# Patient Record
Sex: Female | Born: 1940 | Race: White | Hispanic: No | Marital: Single | Smoking: Never smoker
Health system: Southern US, Community
[De-identification: ages and names within clinical notes are randomized; demographics above are authoritative.]

## PROBLEM LIST (undated history)

## (undated) DIAGNOSIS — E785 Hyperlipidemia, unspecified: Secondary | ICD-10-CM

## (undated) DIAGNOSIS — E559 Vitamin D deficiency, unspecified: Secondary | ICD-10-CM

## (undated) DIAGNOSIS — E079 Disorder of thyroid, unspecified: Secondary | ICD-10-CM

## (undated) DIAGNOSIS — I639 Cerebral infarction, unspecified: Secondary | ICD-10-CM

## (undated) DIAGNOSIS — N189 Chronic kidney disease, unspecified: Secondary | ICD-10-CM

## (undated) DIAGNOSIS — C801 Malignant (primary) neoplasm, unspecified: Secondary | ICD-10-CM

## (undated) DIAGNOSIS — I10 Essential (primary) hypertension: Secondary | ICD-10-CM

## (undated) DIAGNOSIS — E119 Type 2 diabetes mellitus without complications: Secondary | ICD-10-CM

## (undated) HISTORY — PX: ABDOMINAL HYSTERECTOMY: SHX81

## (undated) HISTORY — DX: Vitamin D deficiency, unspecified: E55.9

## (undated) HISTORY — DX: Cerebral infarction, unspecified: I63.9

## (undated) HISTORY — DX: Essential (primary) hypertension: I10

## (undated) HISTORY — DX: Malignant (primary) neoplasm, unspecified: C80.1

## (undated) HISTORY — DX: Hyperlipidemia, unspecified: E78.5

## (undated) HISTORY — PX: KNEE SURGERY: SHX244

## (undated) HISTORY — PX: TONSILLECTOMY: SUR1361

## (undated) HISTORY — DX: Type 2 diabetes mellitus without complications: E11.9

## (undated) HISTORY — DX: Disorder of thyroid, unspecified: E07.9

## (undated) HISTORY — PX: NEPHRECTOMY: SHX65

## (undated) HISTORY — DX: Chronic kidney disease, unspecified: N18.9

---

## 2008-03-03 DIAGNOSIS — I10 Essential (primary) hypertension: Secondary | ICD-10-CM | POA: Insufficient documentation

## 2008-03-03 DIAGNOSIS — E039 Hypothyroidism, unspecified: Secondary | ICD-10-CM | POA: Insufficient documentation

## 2016-01-03 DIAGNOSIS — M2040 Other hammer toe(s) (acquired), unspecified foot: Secondary | ICD-10-CM | POA: Insufficient documentation

## 2016-12-29 DIAGNOSIS — B351 Tinea unguium: Secondary | ICD-10-CM | POA: Insufficient documentation

## 2017-07-02 DIAGNOSIS — E1140 Type 2 diabetes mellitus with diabetic neuropathy, unspecified: Secondary | ICD-10-CM | POA: Insufficient documentation

## 2017-07-02 DIAGNOSIS — R200 Anesthesia of skin: Secondary | ICD-10-CM | POA: Insufficient documentation

## 2019-09-21 ENCOUNTER — Ambulatory Visit: Payer: Self-pay

## 2019-09-27 ENCOUNTER — Ambulatory Visit: Payer: Self-pay

## 2019-10-12 ENCOUNTER — Ambulatory Visit: Payer: Self-pay

## 2020-07-26 DIAGNOSIS — N281 Cyst of kidney, acquired: Secondary | ICD-10-CM | POA: Insufficient documentation

## 2020-09-14 DIAGNOSIS — W19XXXA Unspecified fall, initial encounter: Secondary | ICD-10-CM | POA: Insufficient documentation

## 2020-10-18 DIAGNOSIS — L89620 Pressure ulcer of left heel, unstageable: Secondary | ICD-10-CM | POA: Insufficient documentation

## 2020-10-18 DIAGNOSIS — M6702 Short Achilles tendon (acquired), left ankle: Secondary | ICD-10-CM | POA: Insufficient documentation

## 2020-10-18 DIAGNOSIS — I872 Venous insufficiency (chronic) (peripheral): Secondary | ICD-10-CM | POA: Insufficient documentation

## 2020-10-26 ENCOUNTER — Encounter (HOSPITAL_BASED_OUTPATIENT_CLINIC_OR_DEPARTMENT_OTHER): Payer: Medicare PPO | Attending: Internal Medicine | Admitting: Physician Assistant

## 2020-10-26 ENCOUNTER — Other Ambulatory Visit: Payer: Self-pay

## 2020-10-26 DIAGNOSIS — I872 Venous insufficiency (chronic) (peripheral): Secondary | ICD-10-CM | POA: Diagnosis not present

## 2020-10-26 DIAGNOSIS — E11621 Type 2 diabetes mellitus with foot ulcer: Secondary | ICD-10-CM | POA: Insufficient documentation

## 2020-10-26 DIAGNOSIS — L89620 Pressure ulcer of left heel, unstageable: Secondary | ICD-10-CM | POA: Insufficient documentation

## 2020-10-26 DIAGNOSIS — L97822 Non-pressure chronic ulcer of other part of left lower leg with fat layer exposed: Secondary | ICD-10-CM | POA: Diagnosis not present

## 2020-10-26 DIAGNOSIS — Z8673 Personal history of transient ischemic attack (TIA), and cerebral infarction without residual deficits: Secondary | ICD-10-CM | POA: Insufficient documentation

## 2020-10-26 NOTE — Progress Notes (Signed)
CLOVIA, REINE ([id number]) Visit Report for 10/26/2020 Abuse/Suicide Risk Screen Details Patient Name: Date of Service: Dorothy, Barker 10/26/2020 2:45 PM Medical Record Number: [id number] Patient Account Number: [id number] Date of Birth/Sex: Treating RN: 07-15-1941 (80 y.o. Dorothy Barker Primary Care Breanne Olvera: PA Haig Prophet, NO Other Clinician: Referring Kennth Vanbenschoten: Treating Sherrin Stahle/Extender: Sharalyn Ink in Treatment: 0 Abuse/Suicide Risk Screen Items Answer ABUSE RISK SCREEN: Has anyone close to you tried to hurt or harm you recentlyo No Do you feel uncomfortable with anyone in your familyo No Has anyone forced you do things that you didnt want to doo No Electronic Signature(s) Signed: 10/26/2020 6:01:57 PM By: Baruch Gouty RN, BSN Entered By: Baruch Gouty on 10/26/2020 15:58:49 -------------------------------------------------------------------------------- Activities of Daily Living Details Patient Name: Date of Service: Dorothy, Barker 10/26/2020 2:45 PM Medical Record Number: [id number] Patient Account Number: [id number] Date of Birth/Sex: Treating RN: 07/28/1941 (80 y.o. Dorothy Barker Primary Care Cameren Earnest: PA Haig Prophet, NO Other Clinician: Referring Xiong Haidar: Treating Vaidehi Braddy/Extender: Sharalyn Ink in Treatment: 0 Activities of Daily Living Items Answer Activities of Daily Living (Please select one for each item) Drive Automobile Not Able T Medications ake Need Assistance Use T elephone Completely Able Care for Appearance Need Assistance Use T oilet Need Assistance Bath / Shower Need Assistance Dress Self Need Assistance Feed Self Completely Able Walk Need Assistance Get In / Out Bed Need Assistance Housework Not Able Prepare Meals Not Able Handle Money Need Assistance Shop for Self Need Assistance Electronic Signature(s) Signed: 10/26/2020 6:01:57 PM By: Baruch Gouty RN, BSN Entered By: Baruch Gouty on 10/26/2020  15:59:53 -------------------------------------------------------------------------------- Education Screening Details Patient Name: Date of Service: Dorothy Barker 10/26/2020 2:45 PM Medical Record Number: [id number] Patient Account Number: [id number] Date of Birth/Sex: Treating RN: April 10, 1941 (79 y.o. Dorothy Barker Primary Care Shaquitta Burbridge: PA Haig Prophet, NO Other Clinician: Referring Eames Dibiasio: Treating Leidi Astle/Extender: Sharalyn Ink in Treatment: 0 Primary Learner Assessed: Patient Learning Preferences/Education Level/Primary Language Learning Preference: Explanation, Demonstration, Printed Material Highest Education Level: College or Above Preferred Language: English Cognitive Barrier Language Barrier: No Translator Needed: No Memory Deficit: Yes Emotional Barrier: No Cultural/Religious Beliefs Affecting Medical Care: No Physical Barrier Impaired Vision: Yes Glasses Impaired Hearing: No Decreased Hand dexterity: No Knowledge/Comprehension Knowledge Level: High Comprehension Level: High Ability to understand written instructions: High Ability to understand verbal instructions: High Motivation Anxiety Level: Calm Cooperation: Cooperative Education Importance: Acknowledges Need Interest in Health Problems: Asks Questions Perception: Coherent Willingness to Engage in Self-Management High Activities: Readiness to Engage in Self-Management High Activities: Electronic Signature(s) Signed: 10/26/2020 6:01:57 PM By: Baruch Gouty RN, BSN Entered By: Baruch Gouty on 10/26/2020 16:00:29 -------------------------------------------------------------------------------- Fall Risk Assessment Details Patient Name: Date of Service: Dorothy Barker 10/26/2020 2:45 PM Medical Record Number: [id number] Patient Account Number: [id number] Date of Birth/Sex: Treating RN: August 26, 1940 (79 y.o. Dorothy Barker Primary Care Aldo Sondgeroth: PA Haig Prophet, NO Other Clinician: Referring  Dacie Mandel: Treating Sabrina Arriaga/Extender: Sharalyn Ink in Treatment: 0 Fall Risk Assessment Items Have you had 2 or more falls in the last 12 monthso 0 Yes Have you had any fall that resulted in injury in the last 12 monthso 0 Yes FALLS RISK SCREEN History of falling - immediate or within 3 months 25 Yes Secondary diagnosis (Do you have 2 or more medical diagnoseso) 0 No Ambulatory aid None/bed rest/wheelchair/nurse 0 No Crutches/cane/walker 15 Yes Furniture 0 No Intravenous therapy Access/Saline/Heparin Lock 0 No Gait/Transferring Normal/ bed rest/ wheelchair 0 No Weak (short  steps with or without shuffle, stooped but able to lift head while walking, may seek 10 Yes support from furniture) Impaired (short steps with shuffle, may have difficulty arising from chair, head down, impaired 0 No balance) Mental Status Oriented to own ability 0 Yes Electronic Signature(s) Signed: 10/26/2020 6:01:57 PM By: Baruch Gouty RN, BSN Entered By: Baruch Gouty on 10/26/2020 16:01:00 -------------------------------------------------------------------------------- Foot Assessment Details Patient Name: Date of Service: Dorothy Barker 10/26/2020 2:45 PM Medical Record Number: [id number] Patient Account Number: [id number] Date of Birth/Sex: Treating RN: [birth year] (79 y.o. Dorothy Barker Primary Care Nichael Ehly: PA TIENT, NO Other Clinician: Referring Jonda Alanis: Treating Zayanna Pundt/Extender: Sharalyn Ink in Treatment: 0 Foot Assessment Items Site Locations + = Sensation present, - = Sensation absent, C = Callus, U = Ulcer R = Redness, W = Warmth, M = Maceration, PU = Pre-ulcerative lesion F = Fissure, S = Swelling, D = Dryness Assessment Right: Left: Other Deformity: No No Prior Foot Ulcer: Yes No Prior Amputation: No No Charcot Joint: No No Ambulatory Status: Ambulatory With Help Assistance Device: Walker Gait: Steady Electronic Signature(s) Signed: 10/26/2020 6:01:57  PM By: Baruch Gouty RN, BSN Entered By: Baruch Gouty on 10/26/2020 16:03:56 -------------------------------------------------------------------------------- Nutrition Risk Screening Details Patient Name: Date of Service: Dorothy Barker 10/26/2020 2:45 PM Medical Record Number: [id number] Patient Account Number: [id number] Date of Birth/Sex: Treating RN: 08-02-1941 (79 y.o. Dorothy Barker Primary Care Foday Cone: PA Haig Prophet, NO Other Clinician: Referring Rutha Melgoza: Treating Quron Ruddy/Extender: Sharalyn Ink in Treatment: 0 Height (in): 62 Weight (lbs): 173 Body Mass Index (BMI): 31.6 Nutrition Risk Screening Items Score Screening NUTRITION RISK SCREEN: I have an illness or condition that made me change the kind and/or amount of food I eat 0 No I eat fewer than two meals per day 0 No I eat few fruits and vegetables, or milk products 0 No I have three or more drinks of beer, liquor or wine almost every day 0 No I have tooth or mouth problems that make it hard for me to eat 0 No I don't always have enough money to buy the food I need 0 No I eat alone most of the time 0 No I take three or more different prescribed or over-the-counter drugs a day 1 Yes Without wanting to, I have lost or gained 10 pounds in the last six months 0 No I am not always physically able to shop, cook and/or feed myself 0 No Nutrition Protocols Good Risk Protocol 0 No interventions needed Moderate Risk Protocol High Risk Proctocol Risk Level: Good Risk Score: 1 Electronic Signature(s) Signed: 10/26/2020 6:01:57 PM By: Baruch Gouty RN, BSN Entered By: Baruch Gouty on 10/26/2020 16:01:53

## 2020-10-27 NOTE — Progress Notes (Signed)
NYIA, TSAO ([id number]) Visit Report for 10/26/2020 Chief Complaint Document Details Patient Name: Date of Service: Dorothy Barker, Dorothy Barker 10/26/2020 2:45 PM Medical Record Number: [id number] Patient Account Number: [id number] Date of Birth/Sex: Treating RN: Apr 26, 1941 (80 y.o. Female) Rhae Hammock Primary Care Provider: PA Haig Prophet, NO Other Clinician: Referring Provider: Treating Provider/Extender: Sharalyn Ink in Treatment: 0 Information Obtained from: Patient Chief Complaint Left heel and leg leg ulcer Electronic Signature(s) Signed: 10/26/2020 4:30:44 PM By: Worthy Keeler PA-C Entered By: Worthy Keeler on 10/26/2020 16:30:44 -------------------------------------------------------------------------------- HPI Details Patient Name: Date of Service: Dorothy Barker 10/26/2020 2:45 PM Medical Record Number: [id number] Patient Account Number: [id number] Date of Birth/Sex: Treating RN: 1940/08/30 (80 y.o. Female) Rhae Hammock Primary Care Provider: PA Haig Prophet, NO Other Clinician: Referring Provider: Treating Provider/Extender: Sharalyn Ink in Treatment: 0 History of Present Illness HPI Description: 10/26/2020 upon evaluation today patient appears to be doing somewhat poorly in regard to wounds on her left heel and left lower leg. The leg is really not nearly as bad as the heel ulcer however. This is an unstageable pressure ulcer unfortunately that occurred when the patient was in the hospital secondary to a stroke. At this point the patient is seen with her daughter as well and both are able to answer and respond to questions. With that being said that the biggest issue right now is that the eschar on the heel is not really stable I think that this is going to need to be addressed with more specific and appropriate dressings currently. Apparently she did see a podiatrist more recently and they did perform some sharp debridement to clear away necrotic tissue here on  the heel. T be honest the patient's daughter is not extremely pleased about that she states that they heal was completely dry and stable at that point. With that o being said obviously I did not see anything at that time and I cannot really speak to what the heel look like and then but right now it definitely appears to be much softer and I do not think just Betadine is good to do the job. I think we have to focus on trying to get the eschar off of the heel at this point. The patient is in a assisted living facility currently. With that being said the daughter also questions whether or not she is going require skilled nursing. She does have a history of diabetes mellitus type 2. She also has a history of chronic venous insufficiency. Electronic Signature(s) Signed: 10/27/2020 8:24:45 AM By: Worthy Keeler PA-C Entered By: Worthy Keeler on 10/27/2020 08:24:44 -------------------------------------------------------------------------------- Physical Exam Details Patient Name: Date of Service: Dorothy Barker, Dorothy Barker 10/26/2020 2:45 PM Medical Record Number: [id number] Patient Account Number: [id number] Date of Birth/Sex: Treating RN: 1941-08-10 (80 y.o. Female) Rhae Hammock Primary Care Provider: PA Haig Prophet, NO Other Clinician: Referring Provider: Treating Provider/Extender: Sharalyn Ink in Treatment: 0 Constitutional patient is hypertensive.. pulse regular and within target range for patient.Marland Kitchen respirations regular, non-labored and within target range for patient.Marland Kitchen temperature within target range for patient.. Well-nourished and well-hydrated in no acute distress. Eyes conjunctiva clear no eyelid edema noted. pupils equal round and reactive to light and accommodation. Ears, Nose, Mouth, and Throat no gross abnormality of ear auricles or external auditory canals. normal hearing noted during conversation. mucus membranes moist. Respiratory normal breathing without  difficulty. Cardiovascular 2+ dorsalis pedis/posterior tibialis pulses. no clubbing, cyanosis, significant edema, <3 sec cap refill.  Musculoskeletal Patient unable to walk without assistance. no significant deformity or arthritic changes, no loss or range of motion, no clubbing. Psychiatric this patient is able to make decisions and demonstrates good insight into disease process. Alert and Oriented x 3. pleasant and cooperative. Notes Upon inspection patient does show evidence of a fairly superficial appearing wound on the lateral/posterior leg. There does not appear to be anything that I think is good to be a major issue here to be perfectly honest. With that being said I am more concerned at this point with regard to the heel which is eschar covered and does not appear to be stable. It actually appears that this is starting to soften up I do not see any signs of infection but I do believe we need to get the eschar clear away. Electronic Signature(s) Signed: 10/27/2020 8:25:37 AM By: Worthy Keeler PA-C Entered By: Worthy Keeler on 10/27/2020 08:25:37 -------------------------------------------------------------------------------- Physician Orders Details Patient Name: Date of Service: Dorothy Barker, Dorothy Barker 10/26/2020 2:45 PM Medical Record Number: [id number] Patient Account Number: [id number] Date of Birth/Sex: Treating RN: [birth year] (80 y.o. Female) Rhae Hammock Primary Care Provider: PA Haig Prophet, NO Other Clinician: Referring Provider: Treating Provider/Extender: Sharalyn Ink in Treatment: 0 Verbal / Phone Orders: No Diagnosis Coding ICD-10 Coding Code Description I87.2 Venous insufficiency (chronic) (peripheral) L97.822 Non-pressure chronic ulcer of other part of left lower leg with fat layer exposed E11.621 Type 2 diabetes mellitus with foot ulcer L89.620 Pressure ulcer of left heel, unstageable Follow-up Appointments Return Appointment in 1 week. Bathing/ Shower/  Hygiene May shower with protection but do not get wound dressing(s) wet. Edema Control - Lymphedema / SCD / Other Elevate legs to the level of the heart or above for 30 minutes daily and/or when sitting, a frequency of: Avoid standing for long periods of time. Moisturize legs daily. Off-Loading Other: - offloading shoe for left heel Home Health New wound care orders this week; continue Home Health for wound care. May utilize formulary equivalent dressing for wound treatment orders unless otherwise specified. Alvis Lemmings Wound Treatment Wound #1 - Calcaneus Wound Laterality: Left Cleanser: Wound Cleanser [facility]) Every Other Day/15 Days Discharge Instructions: Cleanse the wound with wound cleanser prior to applying a clean dressing using gauze sponges, not tissue or cotton balls. Prim Dressing: Iodosorb Gel 10 (gm) Tube [facility]) Every Other Day/15 Days ary Discharge Instructions: Apply to wound bed as instructed Secondary Dressing: Woven Gauze Sponge, Non-Sterile 4x4 in [facility]) Every Other Day/15 Days Discharge Instructions: Apply over primary dressing as directed. Secondary Dressing: ABD Pad, 5x9 [facility]) Every Other Day/15 Days Discharge Instructions: Apply over primary dressing as directed. Secured With: [employer], 4.5x3.1 (in/yd) [facility]) Every Other Day/15 Days Discharge Instructions: Secure with Kerlix as directed. Secured With: 55M Medipore H Soft Cloth Surgical T 4 x 2 (in/yd) (Home Health) Every Other Day/15 Days ape Discharge Instructions: Secure dressing with tape as directed. Wound #2 - Lower Leg Wound Laterality: Left, Medial Cleanser: Wound Cleanser (Home Health) Every Other Day/15 Days Discharge Instructions: Cleanse the wound with wound cleanser prior to applying a clean dressing using gauze sponges, not tissue or cotton balls. Prim Dressing: Iodosorb Gel 10 (gm) Tube [facility]) Every Other Day/15 Days ary Discharge Instructions: Apply  to wound bed as instructed Secondary Dressing: Woven Gauze Sponge, Non-Sterile 4x4 in [facility]) Every Other Day/15 Days Discharge Instructions: Apply over primary dressing as directed. Secondary Dressing: ABD Pad, 5x9 [facility]) Every Other  Day/15 Days Discharge Instructions: Apply over primary dressing as directed. Secured With: [employer], 4.5x3.1 (in/yd) [facility]) Every Other Day/15 Days Discharge Instructions: Secure with Kerlix as directed. Secured With: 86M Medipore H Soft Cloth Surgical T 4 x 2 (in/yd) (Home Health) Every Other Day/15 Days ape Discharge Instructions: Secure dressing with tape as directed. Services and Therapies Ankle Brachial Index (ABI) Electronic Signature(s) Signed: 10/27/2020 8:28:17 AM By: Worthy Keeler PA-C Signed: 10/27/2020 5:36:09 PM By: Rhae Hammock RN Entered By: Rhae Hammock on 10/26/2020 16:55:04 -------------------------------------------------------------------------------- Problem List Details Patient Name: Date of Service: Dorothy Barker 10/26/2020 2:45 PM Medical Record Number: [id number] Patient Account Number: [id number] Date of Birth/Sex: Treating RN: April 13, 1941 (79 y.o. 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o. Female) Rhae Hammock Primary Care Provider: PA Haig Prophet, NO Other Clinician: Referring Provider: Treating Provider/Extender: Sharalyn Ink in Treatment: 0 Subjective Chief Complaint Information obtained from Patient Left heel and leg leg ulcer History of Present Illness (HPI) 10/26/2020 upon evaluation today patient appears to be doing somewhat poorly in regard to wounds on her left heel and left lower leg. The leg is really not nearly as bad as the heel ulcer however. This is an unstageable pressure ulcer unfortunately that occurred when the patient was in the hospital secondary to a stroke. At this point the patient is seen with her daughter as well and both are able to answer and respond to questions. With that being said that the biggest issue right now is that the eschar on the heel is not really stable I think that this is going to need to be addressed with more specific and appropriate dressings currently. Apparently she did see a podiatrist more recently and they did perform some sharp debridement to clear away necrotic tissue here on the heel. T be honest o the patient's daughter is not extremely pleased about that she states that they heal was completely dry and stable at that point. With that being said obviously I did not see anything at that time and I cannot really speak to what the heel look like and then but right now it definitely appears to be much softer and I do not think just Betadine is good to do the job. I think we have to focus on trying to get the eschar off of the heel at this point. The patient is in a assisted living facility currently. With that being said the  daughter also questions whether or not she is going require skilled nursing. She does have a history of diabetes mellitus type 2. She also has a history of chronic venous insufficiency. Patient History Information obtained from Patient. Allergies codeine (Reaction: unknown), Lotrel (Severity: Severe, Reaction: syncope, weakness), tramadol (Reaction: unknown) Family History Cancer -  Mother,Maternal Grandparents, Diabetes - Child, Hypertension - Father, Stroke - Mother,Father, No family history of Heart Disease, Hereditary Spherocytosis, Kidney Disease, Lung Disease, Seizures, Thyroid Problems, Tuberculosis. Social History Never smoker, Marital Status - Divorced, Alcohol Use - Never, Drug Use - No History, Caffeine Use - Daily. Medical History Eyes Patient has history of Cataracts Denies history of Glaucoma, Optic Neuritis Cardiovascular Patient has history of Hypertension Endocrine Patient has history of Type II Diabetes Denies history of Type I Diabetes Genitourinary Denies history of End Stage Renal Disease Integumentary (Skin) Denies history of History of Burn Musculoskeletal Patient has history of Osteoarthritis Denies history of Gout, Rheumatoid Arthritis, Osteomyelitis Neurologic Patient has history of Dementia Denies history of Neuropathy, Quadriplegia, Paraplegia, Seizure Disorder Oncologic Denies history of Received Chemotherapy, Received Radiation Psychiatric Denies history of Anorexia/bulimia, Confinement Anxiety Patient is treated with Insulin. Blood sugar is tested. Hospitalization/Surgery History - left nephrectomy. - hysterectomy. - right knee replacement. - bil shoulder rotator cuff repair. - tonsillectomy. - appendectomy. - cholecystectomy. Medical A Surgical History Notes nd Cardiovascular hyperlipidemia Endocrine hypothyroidism Genitourinary hx renal cancer, CKD Neurologic hx CVA Oncologic renal cancer Review of Systems (ROS) Constitutional Symptoms  (General Health) Denies complaints or symptoms of Fatigue, Fever, Chills, Marked Weight Change. Eyes Complains or has symptoms of Glasses / Contacts. Ear/Nose/Mouth/Throat Denies complaints or symptoms of Chronic sinus problems or rhinitis. Respiratory Denies complaints or symptoms of Chronic or frequent coughs, Shortness of Breath. Cardiovascular Denies complaints or symptoms of Chest pain. Gastrointestinal Denies complaints or symptoms of Frequent diarrhea, Nausea, Vomiting. Endocrine Denies complaints or symptoms of Heat/cold intolerance. Genitourinary Denies complaints or symptoms of Frequent urination. Integumentary (Skin) Complains or has symptoms of Wounds - left heel. Musculoskeletal Complains or has symptoms of Muscle Weakness. Denies complaints or symptoms of Muscle Pain. Neurologic Denies complaints or symptoms of Numbness/parasthesias. Psychiatric Denies complaints or symptoms of Claustrophobia, Suicidal. Objective Constitutional patient is hypertensive.. pulse regular and within target range for patient.Marland Kitchen respirations regular, non-labored and within target range for patient.Marland Kitchen temperature within target range for patient.. Well-nourished and well-hydrated in no acute distress. Vitals Time Taken: 3:40 PM, Height: 62 in, Source: Stated, Weight: 173 lbs, Source: Stated, BMI: 31.6, Temperature: 98.4 F, Pulse: 76 bpm, Respiratory Rate: 18 breaths/min, Blood Pressure: 168/98 mmHg, Capillary Blood Glucose: 98 mg/dl. Eyes conjunctiva clear no eyelid edema noted. pupils equal round and reactive to light and accommodation. Ears, Nose, Mouth, and Throat no gross abnormality of ear auricles or external auditory canals. normal hearing noted during conversation. mucus membranes moist. Respiratory normal breathing without difficulty. Cardiovascular 2+ dorsalis pedis/posterior tibialis pulses. no clubbing, cyanosis, significant edema, Musculoskeletal Patient unable to walk  without assistance. no significant deformity or arthritic changes, no loss or range of motion, no clubbing. Psychiatric this patient is able to make decisions and demonstrates good insight into disease process. Alert and Oriented x 3. pleasant and cooperative. General Notes: Upon inspection patient does show evidence of a fairly superficial appearing wound on the lateral/posterior leg. There does not appear to be anything that I think is good to be a major issue here to be perfectly honest. With that being said I am more concerned at this point with regard to the heel which is eschar covered and does not appear to be stable. It actually appears that this is starting to soften up I do not see any signs of infection but I do believe we need to get the eschar clear away. Integumentary (Hair, Skin) Wound #1 status is Open. Original cause of  wound was Pressure Injury. The date acquired was: 09/21/2020. The wound is located on the Left Calcaneus. The wound measures 1.8cm length x 2.8cm width x 0.1cm depth; 3.958cm^2 area and 0.396cm^3 volume. There is Fat Layer (Subcutaneous Tissue) exposed. There is no tunneling or undermining noted. There is a small amount of serosanguineous drainage noted. The wound margin is flat and intact. There is small (1- 33%) pink granulation within the wound bed. There is a large (67-100%) amount of necrotic tissue within the wound bed including Eschar. Wound #2 status is Open. Original cause of wound was Pressure Injury. The date acquired was: 10/19/2020. The wound is located on the Left,Medial Lower Leg. The wound measures 0.3cm length x 1cm width x 0.1cm depth; 0.236cm^2 area and 0.024cm^3 volume. There is Fat Layer (Subcutaneous Tissue) exposed. There is no tunneling or undermining noted. There is a none present amount of drainage noted. The wound margin is flat and intact. There is no granulation within the wound bed. There is a large (67-100%) amount of necrotic tissue within  the wound bed including Eschar. Assessment Active Problems ICD-10 Venous insufficiency (chronic) (peripheral) Non-pressure chronic ulcer of other part of left lower leg with fat layer exposed Type 2 diabetes mellitus with foot ulcer Pressure ulcer of left heel, unstageable Plan Follow-up Appointments: Return Appointment in 1 week. Bathing/ Shower/ Hygiene: May shower with protection but do not get wound dressing(s) wet. Edema Control - Lymphedema / SCD / Other: Elevate legs to the level of the heart or above for 30 minutes daily and/or when sitting, a frequency of: Avoid standing for long periods of time. Moisturize legs daily. Off-Loading: Other: - offloading shoe for left heel Home Health: New wound care orders this week; continue Home Health for wound care. May utilize formulary equivalent dressing for wound treatment orders unless otherwise specified. Alvis Lemmings Services and Therapies ordered were: Ankle Brachial Index (ABI) WOUND #1: - Calcaneus Wound Laterality: Left Cleanser: Wound Cleanser (Home Health) Every Other Day/15 Days Discharge Instructions: Cleanse the wound with wound cleanser prior to applying a clean dressing using gauze sponges, not tissue or cotton balls. Prim Dressing: Iodosorb Gel 10 (gm) Tube [facility]) Every Other Day/15 Days ary Discharge Instructions: Apply to wound bed as instructed Secondary Dressing: Woven Gauze Sponge, Non-Sterile 4x4 in [facility]) Every Other Day/15 Days Discharge Instructions: Apply over primary dressing as directed. Secondary Dressing: ABD Pad, 5x9 [facility]) Every Other Day/15 Days Discharge Instructions: Apply over primary dressing as directed. Secured With: [employer], 4.5x3.1 (in/yd) [facility]) Every Other Day/15 Days Discharge Instructions: Secure with Kerlix as directed. Secured With: 80M Medipore H Soft Cloth Surgical T 4 x 2 (in/yd) (Home Health) Every Other Day/15 Days ape Discharge Instructions:  Secure dressing with tape as directed. WOUND #2: - Lower Leg Wound Laterality: Left, Medial Cleanser: Wound Cleanser (Home Health) Every Other Day/15 Days Discharge Instructions: Cleanse the wound with wound cleanser prior to applying a clean dressing using gauze sponges, not tissue or cotton balls. Prim Dressing: Iodosorb Gel 10 (gm) Tube [facility]) Every Other Day/15 Days ary Discharge Instructions: Apply to wound bed as instructed Secondary Dressing: Woven Gauze Sponge, Non-Sterile 4x4 in [facility]) Every Other Day/15 Days Discharge Instructions: Apply over primary dressing as directed. Secondary Dressing: ABD Pad, 5x9 [facility]) Every Other Day/15 Days Discharge Instructions: Apply over primary dressing as directed. Secured With: [employer], 4.5x3.1 (in/yd) [facility]) Every Other Day/15 Days Discharge Instructions: Secure with Kerlix as directed. Secured With:  58M Medipore H Soft Cloth Surgical T 4 x 2 (in/yd) (Home Health) Every Other Day/15 Days ape Discharge Instructions: Secure dressing with tape as directed. 1. I would recommend currently that we actually go ahead and initiate treatment with Iodosorb this is kind of a secondary option primarily I would have probably going forward with Santyl but the patient really does not have a way to have daily dressing changes at this point. If she was in skilled nursing that would be a different story. Nonetheless I do believe the Iodosorb is a great option here and hopefully will start to loosen up a lot of the eschar in that regard. 2. I am also can recommend at this time that the patient does need to go for an arterial study with ABI and TBI. I feel like that this is good to be important as far as trying to get things under control here we need to know that she has good blood flow before we can perform any type of aggressive debridement. Especially in light of the fact that she was noncompressible here in the office  today. 3. I am also can recommend that we have the patient keep pressure off of the heel we can give her a heel offloading shoe today. Subsequently she should also continue to monitor for and keep pressure off of the heel even when she is sitting and again that may require a little bit of adjustment as far as her daughter helping her figure out the best ways to do this. We will see patient back for reevaluation in 1 week here in the clinic. If anything worsens or changes patient will contact our office for additional recommendations. Electronic Signature(s) Signed: 10/27/2020 8:27:04 AM By: Worthy Keeler PA-C Entered By: Worthy Keeler on 10/27/2020 08:27:04 -------------------------------------------------------------------------------- HxROS Details Patient Name: Date of Service: Dorothy Barker 10/26/2020 2:45 PM Medical Record Number: [id number] Patient Account Number: [id number] Date of Birth/Sex: Treating RN: March 16, 1941 (80 y.o. Female) Baruch Gouty Primary Care Provider: PA Haig Prophet, [place] Other Clinician: Referring Provider: Treating Provider/Extender: Sharalyn Ink in Treatment: 0 Information Obtained From Patient Constitutional Symptoms (General Health) Complaints and Symptoms: Negative for: Fatigue; Fever; Chills; Marked Weight Change Eyes Complaints and Symptoms: Positive for: Glasses / Contacts Medical History: Positive for: Cataracts Negative for: Glaucoma; Optic Neuritis Ear/Nose/Mouth/Throat Complaints and Symptoms: Negative for: Chronic sinus problems or rhinitis Respiratory Complaints and Symptoms: Negative for: Chronic or frequent coughs; Shortness of Breath Cardiovascular Complaints and Symptoms: Negative for: Chest pain Medical History: Positive for: Hypertension Past Medical History Notes: hyperlipidemia Gastrointestinal Complaints and Symptoms: Negative for: Frequent diarrhea; Nausea; Vomiting Endocrine Complaints and Symptoms: Negative for:  Heat/cold intolerance Medical History: Positive for: Type II Diabetes Negative for: Type I Diabetes Past Medical History Notes: hypothyroidism Time with diabetes: 10 yrs Treated with: Insulin Blood sugar tested every day: Yes Tested : daily Genitourinary Complaints and Symptoms: Negative for: Frequent urination Medical History: Negative for: End Stage Renal Disease Past Medical History Notes: hx renal cancer, CKD Integumentary (Skin) Complaints and Symptoms: Positive for: Wounds - left heel Medical History: Negative for: History of Burn Musculoskeletal Complaints and Symptoms: Positive for: Muscle Weakness Negative for: Muscle Pain Medical History: Positive for: Osteoarthritis Negative for: Gout; Rheumatoid Arthritis; Osteomyelitis Neurologic Complaints and Symptoms: Negative for: Numbness/parasthesias Medical History: Positive for: Dementia Negative for: Neuropathy; Quadriplegia; Paraplegia; Seizure Disorder Past Medical History Notes: hx CVA Psychiatric Complaints and Symptoms: Negative for: Claustrophobia; Suicidal Medical History: Negative for: Anorexia/bulimia; Confinement Anxiety Hematologic/Lymphatic  Immunological Oncologic Medical History: Negative for: Received Chemotherapy; Received Radiation Past Medical History Notes: renal cancer HBO Extended History Items Eyes: Cataracts Immunizations Pneumococcal Vaccine: Received Pneumococcal Vaccination: No Implantable Devices No devices added Hospitalization / Surgery History Type of Hospitalization/Surgery left nephrectomy hysterectomy right knee replacement bil shoulder rotator cuff repair tonsillectomy appendectomy cholecystectomy Family and Social History Cancer: Yes - Mother,Maternal Grandparents; Diabetes: Yes - Child; Heart Disease: No; Hereditary Spherocytosis: No; Hypertension: Yes - Father; Kidney Disease: No; Lung Disease: No; Seizures: No; Stroke: Yes - Mother,Father; Thyroid Problems:  No; Tuberculosis: No; Never smoker; Marital Status - Divorced; Alcohol Use: Never; Drug Use: No History; Caffeine Use: Daily; Financial Concerns: No; Food, Clothing or Shelter Needs: No; Support System Lacking: No; Transportation Concerns: No Electronic Signature(s) Signed: 10/26/2020 6:01:57 PM By: Baruch Gouty RN, BSN Signed: 10/27/2020 8:28:17 AM By: Worthy Keeler PA-C Entered By: Baruch Gouty on 10/26/2020 15:58:27 -------------------------------------------------------------------------------- SuperBill Details Patient Name: Date of Service: Dorothy Barker 10/26/2020 Medical Record Number: [id number] Patient Account Number: [id number] Date of Birth/Sex: Treating RN: 1941/02/02 (80 y.o. 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 Pressure ulcer of left heel, unstageable Facility Procedures CPT4 Code: 69629528 Description: 41324 - WOUND CARE VISIT-LEV 4 NEW PT Modifier: Quantity: 1 Physician Procedures : CPT4 Code Description Modifier [id number] - WC PHYS LEVEL 4 - NEW PT ICD-10 Diagnosis Description 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

## 2020-10-29 NOTE — Progress Notes (Signed)
Dorothy Barker, BACORN ([id number]) Visit Report for 10/26/2020 Allergy List Details Patient Name: Date of Service: Dorothy Barker, ROBLERO 10/26/2020 2:45 PM Medical Record Number: [id number] Patient Account Number: [id number] Date of Birth/Sex: Treating RN: Feb 14, 1941 (80 y.o. Female) Baruch Gouty Primary Care Maddelynn Moosman: PA Haig Prophet, [place] Other Clinician: Referring [place] Ice: Treating Lavoris Canizales/Extender: Sharalyn Ink in Treatment: 0 Allergies Active Allergies codeine Reaction: unknown Lotrel Reaction: syncope, weakness Severity: Severe tramadol Reaction: unknown Allergy Notes Electronic Signature(s) Signed: 10/26/2020 6:01:57 PM By: Baruch Gouty RN, BSN Entered By: Baruch Gouty on 10/26/2020 15:45:30 -------------------------------------------------------------------------------- Arrival Information Details Patient Name: Date of Service: Dorothy Barker 10/26/2020 2:45 PM Medical Record Number: [id number] Patient Account Number: [id number] Date of Birth/Sex: Treating RN: [birth year] (79 y.o. Female) Baruch Gouty Primary Care Caryle Helgeson: PA Haig Prophet, NO Other Clinician: Referring Verba Ainley: Treating Aseret Hoffman/Extender: Sharalyn Ink in Treatment: 0 Visit Information Patient Arrived: Wheel Chair Arrival Time: 15:21 Accompanied By: Charolett Bumpers Transfer Assistance: None Patient Identification Verified: Yes Secondary Verification Process Completed: Yes Patient Requires Transmission-Based Precautions: No Patient Has Alerts: No Electronic Signature(s) Signed: 10/26/2020 6:01:57 PM By: Baruch Gouty RN, BSN Entered By: Baruch Gouty on 10/26/2020 15:43:03 -------------------------------------------------------------------------------- Clinic Level of Care Assessment Details Patient Name: Date of Service: Dorothy Barker, EISCHEID 10/26/2020 2:45 PM Medical Record Number: [id number] Patient Account Number: [id number] Date of Birth/Sex: Treating RN: [birth year] (80 y.o. Female) Rhae Hammock Primary Care Elishia Kaczorowski: PA Haig Prophet, NO Other Clinician: Referring Makailee Nudelman: Treating Grecia Lynk/Extender: Sharalyn Ink in Treatment: 0 Clinic Level of Care Assessment Items TOOL 1 Quantity Score X- 1 0 Use when EandM and Procedure is performed on INITIAL visit ASSESSMENTS - Nursing Assessment / Reassessment X- 1 20 General Physical Exam (combine w/ comprehensive assessment (listed just below) when performed on new pt. evals) X- 1 25 Comprehensive Assessment (HX, ROS, Risk Assessments, Wounds Hx, etc.) ASSESSMENTS - Wound and Skin Assessment / Reassessment X- 1 10 Dermatologic / Skin Assessment (not related to wound area) ASSESSMENTS - Ostomy and/or Continence Assessment and Care []  - 0 Incontinence Assessment and Management []  - 0 Ostomy Care Assessment and Management (repouching, etc.) PROCESS - Coordination of Care []  - 0 Simple Patient / Family Education for ongoing care X- 1 20 Complex (extensive) Patient / Family Education for ongoing care X- 1 10 Staff obtains Programmer, systems, Records, T Results / Process Orders est X- 1 10 Staff telephones HHA, Nursing Homes / Clarify orders / etc []  - 0 Routine Transfer to another Facility (non-emergent condition) []  - 0 Routine Hospital Admission (non-emergent condition) X- 1 15 New Admissions / Biomedical engineer / Ordering NPWT Apligraf, etc. , []  - 0 Emergency Hospital Admission (emergent condition) PROCESS - Special Needs []  - 0 Pediatric / Minor Patient Management []  - 0 Isolation Patient Management []  - 0 Hearing / Language / Visual special needs []  - 0 Assessment of Community assistance (transportation, D/C planning, etc.) []  - 0 Additional assistance / Altered mentation []  - 0 Support Surface(s) Assessment (bed, cushion, seat, etc.) INTERVENTIONS - Miscellaneous []  - 0 External ear exam []  - 0 Patient Transfer (multiple staff / Civil Service fast streamer / Similar devices) []  - 0 Simple Staple / Suture removal  (25 or less) []  - 0 Complex Staple / Suture removal (26 or more) []  - 0 Hypo/Hyperglycemic Management (do not check if billed separately) X- 1 15 Ankle / Brachial Index (ABI) - do not check if billed separately Has the patient been seen at the hospital within the last three years: Yes  Total Score: 125 Level Of Care: New/Established - Level 4 Electronic Signature(s) Signed: 10/27/2020 5:36:09 PM By: Rhae Hammock RN Signed: 10/27/2020 5:36:09 PM By: Rhae Hammock RN Entered By: Rhae Hammock on 10/26/2020 16:47:14 -------------------------------------------------------------------------------- Lower Extremity Assessment Details Patient Name: Date of Service: Dorothy Barker 10/26/2020 2:45 PM Medical Record Number: [id number] Patient Account Number: [id number] Date of Birth/Sex: Treating RN: Feb 11, 1941 (79 y.o. Female) Baruch Gouty Primary Care Xaine Sansom: PA Haig Prophet, NO Other Clinician: Referring Tiamarie Furnari: Treating Walsie Smeltz/Extender: Sharalyn Ink in Treatment: 0 Edema Assessment Assessed: [Left: No] [Right: No] Edema: [Left: Ye] [Right: s] Calf Left: Right: Point of Measurement: From Medial Instep 37 cm Ankle Left: Right: Point of Measurement: From Medial Instep 22 cm Vascular Assessment Pulses: Dorsalis Pedis Palpable: [Left:Yes] Notes left DP noncompressible Electronic Signature(s) Signed: 10/26/2020 6:01:57 PM By: Baruch Gouty RN, BSN Entered By: Baruch Gouty on 10/26/2020 16:08:08 -------------------------------------------------------------------------------- Multi-Disciplinary Care Plan Details Patient Name: Date of Service: Dorothy Barker 10/26/2020 2:45 PM Medical Record Number: [id number] Patient Account Number: [id number] Date of Birth/Sex: Treating RN: 1940/10/08 (79 y.o. Female) Rhae Hammock Primary Care Keisean Skowron: PA Haig Prophet, NO Other Clinician: Referring Boneta Standre: Treating Kenyana Husak/Extender: Sharalyn Ink in  Treatment: 0 Active Inactive Orientation to the Wound Care Program Nursing Diagnoses: Knowledge deficit related to the wound healing center program Goals: Patient/caregiver will verbalize understanding of the [facility] Program Date Initiated: 10/26/2020 Target Resolution Date: 11/25/2020 Goal Status: Active Interventions: Provide education on orientation to the wound center Notes: Wound/Skin Impairment Nursing Diagnoses: Impaired tissue integrity Knowledge deficit related to ulceration/compromised skin integrity Goals: Patient will have a decrease in wound volume by X% from date: (specify in notes) Date Initiated: 10/26/2020 Target Resolution Date: 11/25/2020 Goal Status: Active Patient/caregiver will verbalize understanding of skin care regimen Date Initiated: 10/26/2020 Target Resolution Date: 11/25/2020 Goal Status: Active Ulcer/skin breakdown will have a volume reduction of 30% by week 4 Date Initiated: 10/26/2020 Target Resolution Date: 11/22/2020 Goal Status: Active Interventions: Assess patient/caregiver ability to obtain necessary supplies Assess patient/caregiver ability to perform ulcer/skin care regimen upon admission and as needed Assess ulceration(s) every visit Notes: Electronic Signature(s) Signed: 10/27/2020 5:36:09 PM By: Rhae Hammock RN Entered By: Rhae Hammock on 10/26/2020 16:37:25 -------------------------------------------------------------------------------- Pain Assessment Details Patient Name: Date of Service: Dorothy Barker 10/26/2020 2:45 PM Medical Record Number: [id number] Patient Account Number: [id number] Date of Birth/Sex: Treating RN: 09-06-1940 (80 y.o. Female) Baruch Gouty Primary Care Marieke Lubke: PA Haig Prophet, NO Other Clinician: Referring Andrewjames Weirauch: Treating Dagoberto Nealy/Extender: Sharalyn Ink in Treatment: 0 Active Problems Location of Pain Severity and Description of Pain Patient Has Paino Yes Site Locations Pain  Location: Pain in Ulcers With Dressing Change: Yes Duration of the Pain. Constant / Intermittento Intermittent Rate the pain. Current Pain Level: 0 Worst Pain Level: 5 Character of Pain Describe the Pain: Tender Pain Management and Medication Current Pain Management: Other: not walk Is the Current Pain Management Adequate: Adequate How does your wound impact your activities of daily livingo Sleep: No Bathing: No Appetite: No Relationship With Others: No Bladder Continence: No Emotions: No Bowel Continence: No Hobbies: No Toileting: No Dressing: No Electronic Signature(s) Signed: 10/26/2020 6:01:57 PM By: Baruch Gouty RN, BSN Entered By: Baruch Gouty on 10/26/2020 16:12:53 -------------------------------------------------------------------------------- Patient/Caregiver Education Details Patient Name: Date of Service: Dorothy Barker 3/8/2022andnbsp2:45 PM Medical Record Number: [id number] Patient Account Number: [id number] Date of Birth/Gender: Treating RN: 03-10-1941 (79 y.o. Female) Rhae Hammock Primary Care Physician: PA Haig Prophet, NO Other Clinician: Referring  Physician: Treating Physician/Extender: Sharalyn Ink in Treatment: 0 Education Assessment Education Provided To: Patient Education Topics Provided [place]: o Methods: Explain/Verbal Responses: State content correctly Electronic Signature(s) Signed: 10/27/2020 5:36:09 PM By: Rhae Hammock RN Entered By: Rhae Hammock on 10/26/2020 16:37:35 -------------------------------------------------------------------------------- Wound Assessment Details Patient Name: Date of Service: Dorothy Barker, Shalayne B. 10/26/2020 2:45 PM Medical Record Number: [id number] Patient Account Number: [id number] Date of Birth/Sex: Treating RN: 1941-03-11 (79 y.o. 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 Etiology: Wounding Event: Pressure Injury Wound Status: Open Date Acquired: 09/21/2020 Comorbid Cataracts, Hypertension, Type II Diabetes, Osteoarthritis, Weeks Of Treatment: 0 History: Dementia Clustered Wound: No Photos Wound Measurements Length: (cm) 1.8 Width: (cm) 2.8 Depth: (cm) 0.1 Area: (cm) 3.958 Volume: (cm) 0.396 % Reduction in Area: 0% % Reduction in Volume: 0% Epithelialization: Small (1-33%) Tunneling: No Undermining: No Wound Description Classification: Unstageable/Unclassified Wound Margin: Flat and Intact Exudate Amount: Small Exudate Type: Serosanguineous Exudate Color: red, brown Foul Odor After Cleansing: No Slough/Fibrino Yes Wound Bed Granulation Amount: Small (1-33%) Exposed Structure Granulation Quality: Pink Fascia Exposed: No Necrotic Amount: Large (67-100%) Fat Layer (Subcutaneous Tissue) Exposed: Yes Necrotic Quality: Eschar Tendon Exposed: No Muscle Exposed: No Joint Exposed: No Bone Exposed: No Electronic Signature(s) Signed: 10/27/2020 4:22:28 PM By: Sandre Kitty Signed: 10/29/2020 6:00:02 PM By: Baruch Gouty RN, BSN Previous Signature: 10/26/2020 6:01:57 PM Version By: Baruch Gouty RN, BSN Entered By: Sandre Kitty on 10/27/2020 11:54:54 -------------------------------------------------------------------------------- Wound Assessment Details Patient Name: Date of Service: Dorothy Barker 10/26/2020 2:45 PM Medical Record Number: [id number] Patient Account Number: [id number] Date of Birth/Sex: Treating RN: 1940-10-04 (79 y.o. Female) Baruch Gouty Primary Care Nyzier Boivin: PA Haig Prophet, [place] Other Clinician: Referring Cadan Maggart: Treating Sharie Amorin/Extender: Sharalyn Ink in Treatment: 0 Wound Status Wound Number: 2 Primary Pressure  Ulcer Etiology: Wound Location: Left, Medial Lower Leg Wound Status: Open Wounding Event: Pressure Injury Comorbid Cataracts, Hypertension, Type II Diabetes, Osteoarthritis, Date Acquired: 10/19/2020 History: Dementia Weeks Of Treatment: 0 Clustered Wound: No Photos Wound Measurements Length: (cm) 0.3 Width: (cm) 1 Depth: (cm) 0.1 Area: (cm) 0.236 Volume: (cm) 0.024 % Reduction in Area: 0% % Reduction in Volume: 0% Epithelialization: Small (1-33%) Tunneling: No Undermining: No Wound Description Classification: Unstageable/Unclassified Wound Margin: Flat and Intact Exudate Amount: None Present Foul Odor After Cleansing: No Slough/Fibrino No Wound Bed Granulation Amount: None Present (0%) Exposed Structure Necrotic Amount: Large (67-100%) Fascia Exposed: No Necrotic Quality: Eschar Fat Layer (Subcutaneous Tissue) Exposed: Yes Tendon Exposed: No Muscle Exposed: No Joint Exposed: No Bone Exposed: No Electronic Signature(s) Signed: 10/27/2020 4:22:28 PM By: Sandre Kitty Signed: 10/29/2020 6:00:02 PM By: Baruch Gouty RN, BSN Previous Signature: 10/26/2020 6:01:57 PM Version By: Baruch Gouty RN, BSN Entered By: Sandre Kitty on 10/27/2020 11:55:28 -------------------------------------------------------------------------------- Dorothy Barker Details Patient Name: Date of Service: Dorothy Barker 10/26/2020 2:45 PM Medical Record Number: [id number] Patient Account Number: [id number] Date of Birth/Sex: Treating RN: 17-Sep-1940 (80 y.o. Female) Baruch Gouty Primary Care Molly Maselli: PA Haig Prophet, NO Other Clinician: Referring Xiao Graul: Treating Harrold Fitchett/Extender: Sharalyn Ink in Treatment: 0 Vital Signs Time Taken: 15:40 Temperature (F): 98.4 Height (in): 62 Pulse (bpm): 76 Source: Stated Respiratory Rate (breaths/min): 18 Weight (lbs): 173 Blood Pressure (mmHg): 168/98 Source: Stated Capillary Blood Glucose (mg/dl): 98 Body Mass Index (BMI):  31.6 Reference Range: 80 - 120 mg / dl Electronic Signature(s) Signed: 10/26/2020 6:01:57 PM By: Baruch Gouty RN, BSN Entered By: Baruch Gouty on 10/26/2020 15:44:07

## 2020-11-02 ENCOUNTER — Encounter (HOSPITAL_BASED_OUTPATIENT_CLINIC_OR_DEPARTMENT_OTHER): Payer: Medicare PPO | Admitting: Internal Medicine

## 2020-11-02 ENCOUNTER — Other Ambulatory Visit: Payer: Self-pay

## 2020-11-02 DIAGNOSIS — L89620 Pressure ulcer of left heel, unstageable: Secondary | ICD-10-CM | POA: Diagnosis not present

## 2020-11-03 NOTE — Progress Notes (Signed)
Dorothy Barker, Dorothy Barker ([id number]) Visit Report for 11/02/2020 Arrival Information Details Patient Name: Date of Service: Dorothy Barker, Dorothy Barker 11/02/2020 2:00 PM Medical Record Number: [id number] Patient Account Number: [id number] Date of Birth/Sex: Treating RN: 1940/11/09 (80 y.o. Dorothy Barker Primary Care Dorothy Barker: PA Haig [place], [place] Other Clinician: Referring Tymira Horkey: Treating Sharion Grieves/Extender: Dorothy Barker in Treatment: 1 Visit Information History Since Last Visit Added or deleted any medications: No Patient Arrived: Wheel Chair Any new allergies or adverse reactions: No Arrival Time: 14:03 Had a fall or experienced change in No Accompanied By: dgt activities of daily living that may affect Transfer Assistance: None risk of falls: Patient Requires Transmission-Based Precautions: No Signs or symptoms of abuse/neglect since last visito No Patient Has Alerts: No Hospitalized since last visit: No Implantable device outside of the clinic excluding No cellular tissue based products placed in the center since last visit: Has Dressing in Place as Prescribed: Yes Has Compression in Place as Prescribed: Yes Pain Present Now: Yes Electronic Signature(s) Signed: 11/02/2020 5:54:58 PM By: Baruch Gouty RN, BSN Entered By: Baruch Gouty on 11/02/2020 14:07:25 -------------------------------------------------------------------------------- Clinic Level of Care Assessment Details Patient Name: Date of Service: Dorothy Barker 11/02/2020 2:00 PM Medical Record Number: [id number] Patient Account Number: [id number] Date of Birth/Sex: Treating RN: [birth year] (80 y.o. Dorothy Barker, Dorothy Barker Primary Care Dorothy Barker: PA [place], [place] Other Clinician: Referring Harlei Lehrmann: Treating Deziray Nabi/Extender: Dorothy Barker in Treatment: 1 Clinic Level of Care Assessment Items TOOL 4 Quantity Score X- 1 0 Use when only an EandM is performed on FOLLOW-UP visit ASSESSMENTS - Nursing Assessment /  Reassessment X- 1 10 Reassessment of Co-morbidities (includes updates in patient status) X- 1 5 Reassessment of Adherence to Treatment Plan ASSESSMENTS - Wound and Skin A ssessment / Reassessment X - Simple Wound Assessment / Reassessment - one wound 1 5 []  - 0 Complex Wound Assessment / Reassessment - multiple wounds X- 1 10 Dermatologic / Skin Assessment (not related to wound area) ASSESSMENTS - Focused Assessment []  - 0 Circumferential Edema Measurements - multi extremities []  - 0 Nutritional Assessment / Counseling / Intervention []  - 0 Lower Extremity Assessment (monofilament, tuning fork, pulses) []  - 0 Peripheral Arterial Disease Assessment (using hand held doppler) ASSESSMENTS - Ostomy and/or Continence Assessment and Care []  - 0 Incontinence Assessment and Management []  - 0 Ostomy Care Assessment and Management (repouching, etc.) PROCESS - Coordination of Care X - Simple Patient / Family Education for ongoing care 1 15 []  - 0 Complex (extensive) Patient / Family Education for ongoing care X- 1 10 Staff obtains Programmer, systems, Records, T Results / Process Orders est []  - 0 Staff telephones HHA, Nursing Homes / Clarify orders / etc []  - 0 Routine Transfer to another Facility (non-emergent condition) []  - 0 Routine Hospital Admission (non-emergent condition) X- 1 15 New Admissions / Biomedical engineer / Ordering NPWT Apligraf, etc. , []  - 0 Emergency Hospital Admission (emergent condition) X- 1 10 Simple Discharge Coordination []  - 0 Complex (extensive) Discharge Coordination PROCESS - Special Needs []  - 0 Pediatric / Minor Patient Management []  - 0 Isolation Patient Management []  - 0 Hearing / Language / Visual special needs []  - 0 Assessment of Community assistance (transportation, D/C planning, etc.) []  - 0 Additional assistance / Altered mentation []  - 0 Support Surface(s) Assessment (bed, cushion, seat, etc.) INTERVENTIONS - Wound Cleansing /  Measurement X - Simple Wound Cleansing - one wound 1 5 []  - 0 Complex Wound Cleansing - multiple wounds X- 1 5  Wound Imaging (photographs - any number of wounds) []  - 0 Wound Tracing (instead of photographs) X- 1 5 Simple Wound Measurement - one wound []  - 0 Complex Wound Measurement - multiple wounds INTERVENTIONS - Wound Dressings X - Small Wound Dressing one or multiple wounds 1 10 []  - 0 Medium Wound Dressing one or multiple wounds []  - 0 Large Wound Dressing one or multiple wounds []  - 0 Application of Medications - topical []  - 0 Application of Medications - injection INTERVENTIONS - Miscellaneous []  - 0 External ear exam []  - 0 Specimen Collection (cultures, biopsies, blood, body fluids, etc.) []  - 0 Specimen(s) / Culture(s) sent or taken to Lab for analysis []  - 0 Patient Transfer (multiple staff / Civil Service fast streamer / Similar devices) []  - 0 Simple Staple / Suture removal (25 or less) []  - 0 Complex Staple / Suture removal (26 or more) []  - 0 Hypo / Hyperglycemic Management (close monitor of Blood Glucose) []  - 0 Ankle / Brachial Index (ABI) - do not check if billed separately X- 1 5 Vital Signs Has the patient been seen at the hospital within the last three years: Yes Total Score: 110 Level Of Care: New/Established - Level 3 Electronic Signature(s) Signed: 11/03/2020 5:17:38 PM By: Rhae Hammock RN Entered By: Rhae Hammock on 11/02/2020 15:28:15 -------------------------------------------------------------------------------- Encounter Discharge Information Details Patient Name: Date of Service: Dorothy Barker 11/02/2020 2:00 PM Medical Record Number: [id number] Patient Account Number: [id number] Date of Birth/Sex: Treating RN: 1940/11/11 (80 y.o. Dorothy Barker, Dorothy Barker Primary Care Mischa Pollard: PA [place]ig [place], [place] Other Clinician: Referring Shakinah Navis: Treating Thaison Kolodziejski/Extender: Dorothy Barker in Treatment: 1 Encounter Discharge Information Items Discharge  Condition: Stable Ambulatory Status: Wheelchair Discharge Destination: Home Transportation: Private Auto Accompanied By: Daughter Schedule Follow-up Appointment: Yes Clinical Summary of Care: Provided on 11/02/2020 Form Type Recipient Paper Patient Patient Electronic Signature(s) Signed: 11/02/2020 3:19:55 PM By: Lorrin Jackson Entered By: Lorrin Jackson on 11/02/2020 15:19:54 -------------------------------------------------------------------------------- Lower Extremity Assessment Details Patient Name: Date of Service: Dorothy Barker, Dorothy Barker 11/02/2020 2:00 PM Medical Record Number: [id number] Patient Account Number: [id number] Date of Birth/Sex: Treating RN: 1940-11-20 (80 y.o. Dorothy Barker Primary Care Sigismund Cross: PA Haig [place], [place] Other Clinician: Referring Kalisa Girtman: Treating Remee Charley/Extender: Dorothy Barker in Treatment: 1 Edema Assessment Assessed: [Left: No] [Right: No] Edema: [Left: Ye] [Right: s] Calf Left: Right: Point of Measurement: From Medial Instep 36.8 cm Ankle Left: Right: Point of Measurement: From Medial Instep 21.2 cm Vascular Assessment Pulses: Dorsalis Pedis Palpable: [Left:Yes] Electronic Signature(s) Signed: 11/02/2020 5:54:58 PM By: Baruch Gouty RN, BSN Entered By: Baruch Gouty on 11/02/2020 14:14:47 -------------------------------------------------------------------------------- Multi Wound Chart Details Patient Name: Date of Service: Dorothy Barker 11/02/2020 2:00 PM Medical Record Number: [id number] Patient Account Number: [id number] Date of Birth/Sex: Treating RN: 1941-04-01 (80 y.o. Dorothy Barker, Dorothy Barker Primary Care Aaleigha Bozza: PA [place]ig [place], [place] Other Clinician: Referring Eric Morganti: Treating Coron Rossano/Extender: Dorothy Barker in Treatment: 1 Vital Signs Height(in): 37 Pulse(bpm): 31 Weight(lbs): 173 Blood Pressure(mmHg): 165/73 Body Mass Index(BMI): 32 Temperature(F): 97.7 Respiratory Rate(breaths/min): 18 Photos: [1:No Photos  Left Calcaneus] [2:No Photos Left, Medial Lower Leg] [N/A:N/A N/A] Wound Location: [1:Pressure Injury] [2:Pressure Injury] [N/A:N/A] Wounding Event: [1:Pressure Ulcer] [2:Pressure Ulcer] [N/A:N/A] Primary Etiology: [1:Diabetic Wound/Ulcer of the Lower] [2:N/A] [N/A:N/A] Secondary Etiology: [1:Extremity Cataracts, Hypertension, Type II] [2:Cataracts, Hypertension, Type II] [N/A:N/A] Comorbid History: [1:Diabetes, Osteoarthritis, Dementia 09/21/2020] [2:Diabetes, Osteoarthritis, Dementia 10/19/2020] [N/A:N/A] Date Acquired: [1:1] [2:1] [N/A:N/A] Weeks of Treatment: [1:Open] [2:Open] [N/A:N/A] Wound Status: [1:1.5x2.8x0.1] [2:0.3x1.1x0.1] [N/A:N/A] Measurements L x W  x D (cm) [1:3.299] [2:0.259] [N/A:N/A] A (cm) : rea [1:0.33] [2:0.026] [N/A:N/A] Volume (cm) : [1:16.60%] [2:-9.70%] [N/A:N/A] % Reduction in A rea: [1:16.70%] [2:-8.30%] [N/A:N/A] % Reduction in Volume: [1:Unstageable/Unclassified] [2:Unstageable/Unclassified] [N/A:N/A] Classification: [1:None Present] [2:None Present] [N/A:N/A] Exudate A mount: [1:Flat and Intact] [2:Flat and Intact] [N/A:N/A] Wound Margin: [1:None Present (0%)] [2:None Present (0%)] [N/A:N/A] Granulation A mount: [1:Large (67-100%)] [2:Large (67-100%)] [N/A:N/A] Necrotic A mount: [1:Eschar] [2:Eschar] [N/A:N/A] Necrotic Tissue: [1:Fat Layer (Subcutaneous Tissue): Yes Fat Layer (Subcutaneous Tissue): Yes N/A] Exposed Structures: [1:Fascia: No Tendon: No Muscle: No Joint: No Bone: No Small (1-33%)] [2:Fascia: No Tendon: No Muscle: No Joint: No Bone: No Small (1-33%)] [N/A:N/A] Treatment Notes Wound #1 (Calcaneus) Wound Laterality: Left Cleanser Wound Cleanser Discharge Instruction: Cleanse the wound with wound cleanser prior to applying a clean dressing using gauze sponges, not tissue or cotton balls. Peri-Wound Care Topical Primary Dressing Iodosorb Gel 10 (gm) Tube Discharge Instruction: Apply to wound bed as instructed Secondary Dressing Woven Gauze  Sponge, Non-Sterile 4x4 in Discharge Instruction: Apply over primary dressing as directed. ABD Pad, 5x9 Discharge Instruction: Apply over primary dressing as directed. Secured With [employer], 4.5x3.1 (in/yd) Discharge Instruction: Secure with Kerlix as directed. 84M Medipore H Soft Cloth Surgical T 4 x 2 (in/yd) ape Discharge Instruction: Secure dressing with tape as directed. Compression Wrap Compression Stockings Add-Ons Wound #2 (Lower Leg) Wound Laterality: Left, Medial Cleanser Wound Cleanser Discharge Instruction: Cleanse the wound with wound cleanser prior to applying a clean dressing using gauze sponges, not tissue or cotton balls. Peri-Wound Care Topical Primary Dressing Iodosorb Gel 10 (gm) Tube Discharge Instruction: Apply to wound bed as instructed Secondary Dressing Woven Gauze Sponge, Non-Sterile 4x4 in Discharge Instruction: Apply over primary dressing as directed. ABD Pad, 5x9 Discharge Instruction: Apply over primary dressing as directed. Secured With [employer], 4.5x3.1 (in/yd) Discharge Instruction: Secure with Kerlix as directed. 84M Medipore H Soft Cloth Surgical T 4 x 2 (in/yd) ape Discharge Instruction: Secure dressing with tape as directed. Compression Wrap Compression Stockings Add-Ons Electronic Signature(s) Signed: 11/02/2020 5:48:16 PM By: Linton Ham MD Signed: 11/03/2020 5:17:38 PM By: Rhae Hammock RN Entered By: Linton Ham on 11/02/2020 15:55:56 -------------------------------------------------------------------------------- Multi-Disciplinary Care Plan Details Patient Name: Date of Service: Dorothy Barker 11/02/2020 2:00 PM Medical Record Number: [id number] Patient Account Number: [id number] Date of Birth/Sex: Treating RN: Jan 07, 1941 (80 y.o. Dorothy Barker, Dorothy Barker Primary Care Tylisha Danis: PA [place]ig [place], [place] Other Clinician: Referring Aliahna Statzer: Treating Harjot Dibello/Extender: Dorothy Barker in Treatment:  1 Active Inactive Orientation to the Wound Care Program Nursing Diagnoses: Knowledge deficit related to the wound healing center program Goals: Patient/caregiver will verbalize understanding of the [facility] Date Initiated: 10/26/2020 Target Resolution Date: 11/25/2020 Goal Status: Active Interventions: Provide education on orientation to the wound center Notes: Wound/Skin Impairment Nursing Diagnoses: Impaired tissue integrity Knowledge deficit related to ulceration/compromised skin integrity Goals: Patient will have a decrease in wound volume by X% from date: (specify in notes) Date Initiated: 10/26/2020 Target Resolution Date: 11/25/2020 Goal Status: Active Patient/caregiver will verbalize understanding of skin care regimen Date Initiated: 10/26/2020 Target Resolution Date: 11/25/2020 Goal Status: Active Ulcer/skin breakdown will have a volume reduction of 30% by week 4 Date Initiated: 10/26/2020 Target Resolution Date: 11/22/2020 Goal Status: Active Interventions: Assess patient/caregiver ability to obtain necessary supplies Assess patient/caregiver ability to perform ulcer/skin care regimen upon admission and as needed Assess ulceration(s) every visit Notes: Electronic Signature(s) Signed: 11/03/2020 5:17:38 PM By: Rhae Hammock RN Entered By: Rhae Hammock on 11/02/2020  15:26:15 -------------------------------------------------------------------------------- Pain Assessment Details Patient Name: Date of Service: Dorothy Barker, Dorothy Barker 11/02/2020 2:00 PM Medical Record Number: [id number] Patient Account Number: [id number] Date of Birth/Sex: Treating RN: 1941/03/06 (80 y.o. Dorothy Barker Primary Care Jarae Nemmers: PA Haig [place], [place] Other Clinician: Referring Terriona Horlacher: Treating Kayra Crowell/Extender: Dorothy Barker in Treatment: 1 Active Problems Location of Pain Severity and Description of Pain Patient Has Paino Yes Site Locations Pain Location: Pain  Location: Pain in Ulcers With Dressing Change: Yes Duration of the Pain. Constant / Intermittento Intermittent Rate the pain. Current Pain Level: 0 Worst Pain Level: 5 Least Pain Level: 0 Character of Pain Describe the Pain: Aching, Tender Pain Management and Medication Current Pain Management: Medication: Yes Is the Current Pain Management Adequate: Adequate Rest: Yes How does your wound impact your activities of daily livingo Sleep: Yes Bathing: No Appetite: No Relationship With Others: No Bladder Continence: No Emotions: No Bowel Continence: No Work: No Toileting: No Drive: No Dressing: No Hobbies: No Electronic Signature(s) Signed: 11/02/2020 5:54:58 PM By: Baruch Gouty RN, BSN Entered By: Baruch Gouty on 11/02/2020 14:14:03 -------------------------------------------------------------------------------- Patient/Caregiver Education Details Patient Name: Date of Service: Dorothy Barker, Dorothy Y B. 3/15/2022andnbsp2:00 PM Medical Record Number: [id number] Patient Account Number: [id number] Date of Birth/Gender: Treating RN: 01-06-1941 (79 y.o. Benjaman Lobe Primary Care Physician: PA Haig Prophet, [place] Other Clinician: Referring Physician: Treating Physician/Extender: Dorothy Barker in Treatment: 1 Education Assessment Education Provided To: Patient Education Topics Provided Welcome T [facility]: o Methods: Explain/Verbal Responses: State content correctly Electronic Signature(s) Signed: 11/03/2020 5:17:38 PM By: Rhae Hammock RN Entered By: Rhae Hammock on 11/02/2020 15:26:29 -------------------------------------------------------------------------------- Wound Assessment Details Patient Name: Date of Service: Dorothy Barker 11/02/2020 2:00 PM Medical Record Number: [id number] Patient Account Number: [id number] Date of Birth/Sex: Treating RN: October 27, 1940 (79 y.o. 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 Etiology: Wounding Event: Pressure Injury Wound Status: Open Date Acquired: 09/21/2020 Comorbid Cataracts, Hypertension, Type II Diabetes, Osteoarthritis, Weeks Of Treatment: 1 History: Dementia Clustered Wound: No Photos Wound Measurements Length: (cm) 1.5 Width: (cm) 2.8 Depth: (cm) 0.1 Area: (cm) 3.299 Volume: (cm) 0.33 % Reduction in Area: 16.6% % Reduction in Volume: 16.7% Epithelialization: Small (1-33%) Tunneling: No Undermining: No Wound Description Classification: Unstageable/Unclassified Wound Margin: Flat and Intact Exudate Amount: None Present Foul Odor After Cleansing: No Slough/Fibrino No Wound Bed Granulation Amount: None Present (0%) Exposed Structure Necrotic Amount: Large (67-100%) Fascia Exposed: No Necrotic Quality: Eschar Fat Layer (Subcutaneous Tissue) Exposed: Yes Tendon Exposed: No Muscle Exposed: No Joint Exposed: No Bone Exposed: No Treatment Notes Wound #1 (Calcaneus) Wound Laterality: Left Cleanser Wound Cleanser Discharge Instruction: Cleanse the wound with wound cleanser prior to applying a clean dressing using gauze sponges, not tissue or cotton balls. Peri-Wound Care Topical Primary Dressing Iodosorb Gel 10 (gm) Tube Discharge Instruction: Apply to wound bed as instructed Secondary Dressing Woven Gauze Sponge, Non-Sterile 4x4 in Discharge Instruction: Apply over primary dressing as directed. ABD Pad, 5x9 Discharge Instruction: Apply over primary dressing as directed. Secured With [employer], 4.5x3.1 (in/yd) Discharge Instruction: Secure with Kerlix as directed. 69M Medipore H Soft Cloth Surgical T 4 x 2 (in/yd) ape Discharge Instruction: Secure dressing with tape as  directed. Compression Wrap Compression Stockings Add-Ons Electronic Signature(s) Signed: 11/02/2020 5:27:17 PM By: Sandre Kitty Signed: 11/02/2020  5:54:58 PM By: Baruch Gouty RN, BSN Entered By: Sandre Kitty on 11/02/2020 17:24:58 -------------------------------------------------------------------------------- Wound Assessment Details Patient Name: Date of Service: Dorothy Barker, Dorothy Barker 11/02/2020 2:00 PM Medical Record Number: [id number] Patient Account Number: [id number] Date of Birth/Sex: Treating RN: Apr 23, 1941 (80 y.o. Dorothy Barker Primary Care Rodricus Candelaria: PA Haig [place], [place] Other Clinician: Referring Davarion Cuffee: Treating Zedrick Springsteen/Extender: Dorothy Barker in Treatment: 1 Wound Status Wound Number: 2 Primary Pressure Ulcer Etiology: Wound Location: Left, Medial Lower Leg Wound Status: Open Wounding Event: Pressure Injury Comorbid Cataracts, Hypertension, Type II Diabetes, Osteoarthritis, Date Acquired: 10/19/2020 History: Dementia Weeks Of Treatment: 1 Clustered Wound: No Photos Wound Measurements Length: (cm) 0.3 Width: (cm) 1.1 Depth: (cm) 0.1 Area: (cm) 0.259 Volume: (cm) 0.026 % Reduction in Area: -9.7% % Reduction in Volume: -8.3% Epithelialization: Small (1-33%) Tunneling: No Undermining: No Wound Description Classification: Unstageable/Unclassified Wound Margin: Flat and Intact Exudate Amount: None Present Wound Bed Granulation Amount: None Present (0%) Necrotic Amount: Large (67-100%) Necrotic Quality: Eschar Foul Odor After Cleansing: No Slough/Fibrino No Exposed Structure Fascia Exposed: No Fat Layer (Subcutaneous Tissue) Exposed: Yes Tendon Exposed: No Muscle Exposed: No Joint Exposed: No Bone Exposed: No Treatment Notes Wound #2 (Lower Leg) Wound Laterality: Left, Medial Cleanser Wound Cleanser Discharge Instruction: Cleanse the wound with wound cleanser prior to applying a clean dressing using gauze sponges, not tissue or cotton  balls. Peri-Wound Care Topical Primary Dressing Iodosorb Gel 10 (gm) Tube Discharge Instruction: Apply to wound bed as instructed Secondary Dressing Woven Gauze Sponge, Non-Sterile 4x4 in Discharge Instruction: Apply over primary dressing as directed. ABD Pad, 5x9 Discharge Instruction: Apply over primary dressing as directed. Secured With [employer], 4.5x3.1 (in/yd) Discharge Instruction: Secure with Kerlix as directed. 57M Medipore H Soft Cloth Surgical T 4 x 2 (in/yd) ape Discharge Instruction: Secure dressing with tape as directed. Compression Wrap Compression Stockings Add-Ons Electronic Signature(s) Signed: 11/02/2020 5:27:17 PM By: Sandre Kitty Signed: 11/02/2020 5:54:58 PM By: Baruch Gouty RN, BSN Entered By: Sandre Kitty on 11/02/2020 17:24:42 -------------------------------------------------------------------------------- Vitals Details Patient Name: Date of Service: Dorothy Barker 11/02/2020 2:00 PM Medical Record Number: [id number] Patient Account Number: [id number] Date of Birth/Sex: Treating RN: 1941-07-07 (80 y.o. Dorothy Barker Primary Care Anedra Penafiel: PA Haig [place], [place] Other Clinician: Referring Aadyn Buchheit: Treating Jermario Kalmar/Extender: Dorothy Barker in Treatment: 1 Vital Signs Time Taken: 14:09 Temperature (F): 97.7 Height (in): 62 Pulse (bpm): 75 Source: Stated Respiratory Rate (breaths/min): 18 Weight (lbs): 173 Blood Pressure (mmHg): 165/73 Source: Stated Reference Range: 80 - 120 mg / dl Body Mass Index (BMI): 31.6 Notes BS monitored at facility Electronic Signature(s) Signed: 11/02/2020 5:54:58 PM By: Baruch Gouty RN, BSN Entered By: Baruch Gouty on 11/02/2020 14:10:39

## 2020-11-03 NOTE — Progress Notes (Signed)
Dorothy, Barker ([id number]) Visit Report for 11/02/2020 HPI Details Patient Name: Date of Service: Dorothy Barker, Dorothy Barker 11/02/2020 2:00 PM Medical Record Number: [id number] Patient Account Number: [id number] Date of Birth/Sex: Treating RN: 1940/11/05 (80 y.o. Dorothy Barker, Dorothy Barker Primary Care Provider: PA Darnelle Spangle Other Clinician: Referring Provider: Treating Provider/Extender: Cheree Ditto in Treatment: 1 History of Present Illness HPI Description: 10/26/2020 upon evaluation today patient appears to be doing somewhat poorly in regard to wounds on her left heel and left lower leg. The leg is really not nearly as bad as the heel ulcer however. This is an unstageable pressure ulcer unfortunately that occurred when the patient was in the hospital secondary to a stroke. At this point the patient is seen with her daughter as well and both are able to answer and respond to questions. With that being said that the biggest issue right now is that the eschar on the heel is not really stable I think that this is going to need to be addressed with more specific and appropriate dressings currently. Apparently she did see a podiatrist more recently and they did perform some sharp debridement to clear away necrotic tissue here on the heel. T be honest the patient's daughter is not extremely pleased about that she states that they heal was completely dry and stable at that point. With that o being said obviously I did not see anything at that time and I cannot really speak to what the heel look like and then but right now it definitely appears to be much softer and I do not think just Betadine is good to do the job. I think we have to focus on trying to get the eschar off of the heel at this point. The patient is in a assisted living facility currently. With that being said the daughter also questions whether or not she is going require skilled nursing. She does have a history of diabetes mellitus type 2. She  also has a history of chronic venous insufficiency. 3/15; patient admitted to the clinic last week. She has an unstageable pressure ulcer on the left heel that apparently occurred at [facility] when she was recovering from the stroke. She spent [facility] time [facility] and now is in [facility]. She has home health twice a week we have been using Iodoflex on this area. It was pointed out last week that she does not have palpable pulses below the femoral we ordered arterial studies but her daughter has not heard anything about this we will recheck this. Electronic Signature(s) Signed: 11/02/2020 5:48:16 PM By: Linton Ham MD Entered By: Linton Ham on 11/02/2020 15:57:03 -------------------------------------------------------------------------------- Physical Exam Details Patient Name: Date of Service: Dorothy Barker 11/02/2020 2:00 PM Medical Record Number: [id number] Patient Account Number: [id number] Date of Birth/Sex: Treating RN: Oct 02, 1940 (80 y.o. Dorothy Barker, Dorothy Barker Primary Care Provider: PA Haig Prophet, NO Other Clinician: Referring Provider: Treating Provider/Extender: Cheree Ditto in Treatment: 1 Cardiovascular Pedal pulses absent on the left. Notes Wound exam; left heel. Completely necrotic nonviable surface although because of the significant PAD possibility I did not debride this. Wound is somewhat tender but without evidence of infection Electronic Signature(s) Signed: 11/02/2020 5:48:16 PM By: Linton Ham MD Entered By: Linton Ham on 11/02/2020 15:59:26 -------------------------------------------------------------------------------- Physician Orders Details Patient Name: Date of Service: Dorothy Barker 11/02/2020 2:00 PM Medical Record Number: [id number] Patient Account Number: [id number] Date of Birth/Sex: Treating RN: Jan 04, 1941 (80 y.o. F)  Rhae Hammock Primary Care Provider: PA Haig Prophet, [place] Other  Clinician: Referring Provider: Treating Provider/Extender: Cheree Ditto in Treatment: 1 Verbal / Phone Orders: No Diagnosis Coding Follow-up Appointments Return Appointment in 1 week. Bathing/ Shower/ Hygiene May shower with protection but do not get wound dressing(s) wet. Edema Control - Lymphedema / SCD / Other Elevate legs to the level of the heart or above for 30 minutes daily and/or when sitting, a frequency of: Avoid standing for long periods of time. Moisturize legs daily. Off-Loading Other: - offloading shoe for left heel Home Health No change in wound care orders this week; continue Home Health for wound care. May utilize formulary equivalent dressing for wound treatment orders unless otherwise specified. - Home health to change twice a week Wound Treatment Wound #1 - Calcaneus Wound Laterality: Left Cleanser: Wound Cleanser (Home Health) Every Other Day/15 Days Discharge Instructions: Cleanse the wound with wound cleanser prior to applying a clean dressing using gauze sponges, not tissue or cotton balls. Prim Dressing: Iodosorb Gel 10 (gm) Tube [facility]) Every Other Day/15 Days ary Discharge Instructions: Apply to wound bed as instructed Secondary Dressing: Woven Gauze Sponge, Non-Sterile 4x4 in [facility]) Every Other Day/15 Days Discharge Instructions: Apply over primary dressing as directed. Secondary Dressing: ABD Pad, 5x9 [facility]) Every Other Day/15 Days Discharge Instructions: Apply over primary dressing as directed. Secured With: [employer], 4.5x3.1 (in/yd) [facility]) Every Other Day/15 Days Discharge Instructions: Secure with Kerlix as directed. Secured With: 67M Medipore H Soft Cloth Surgical T 4 x 2 (in/yd) (Home Health) Every Other Day/15 Days ape Discharge Instructions: Secure dressing with tape as directed. Wound #2 - Lower Leg Wound Laterality: Left, Medial Cleanser: Wound Cleanser (Home Health) Every Other Day/15  Days Discharge Instructions: Cleanse the wound with wound cleanser prior to applying a clean dressing using gauze sponges, not tissue or cotton balls. Prim Dressing: Iodosorb Gel 10 (gm) Tube [facility]) Every Other Day/15 Days ary Discharge Instructions: Apply to wound bed as instructed Secondary Dressing: Woven Gauze Sponge, Non-Sterile 4x4 in [facility]) Every Other Day/15 Days Discharge Instructions: Apply over primary dressing as directed. Secondary Dressing: ABD Pad, 5x9 [facility]) Every Other Day/15 Days Discharge Instructions: Apply over primary dressing as directed. Secured With: [employer], 4.5x3.1 (in/yd) [facility]) Every Other Day/15 Days Discharge Instructions: Secure with Kerlix as directed. Secured With: 67M Medipore H Soft Cloth Surgical T 4 x 2 (in/yd) (Home Health) Every Other Day/15 Days ape Discharge Instructions: Secure dressing with tape as directed. Electronic Signature(s) Signed: 11/02/2020 5:48:16 PM By: Linton Ham MD Signed: 11/03/2020 5:17:38 PM By: Rhae Hammock RN Entered By: Rhae Hammock on 11/02/2020 15:26:07 -------------------------------------------------------------------------------- Problem List Details Patient Name: Date of Service: Dorothy Barker 11/02/2020 2:00 PM Medical Record Number: [id number] Patient Account Number: [id number] Date of Birth/Sex: Treating RN: [birth year] (79 y.o. 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/03/2021 No Yes E11.621 Type 2 diabetes mellitus with foot ulcer 10/26/2020 No Yes L89.620 Pressure ulcer of left heel, unstageable 10/26/2020 No  Yes Inactive Problems Resolved Problems Electronic Signature(s) Signed: 11/02/2020 5:48:16 PM By: Linton Ham MD Entered By: Linton Ham on 11/02/2020 15:55:50 -------------------------------------------------------------------------------- Progress Note Details Patient Name: Date of Service: Dorothy Barker  Eldridge Scot 11/02/2020 2:00 PM Medical Record Number: [id number] Patient Account Number: [id number] Date of Birth/Sex: Treating RN: Mar 23, 1941 (80 y.o. Dorothy Barker, Dorothy Barker Primary Care Provider: PA Darnelle Spangle Other Clinician: Referring Provider: Treating Provider/Extender: Cheree Ditto in Treatment: 1 Subjective History of Present Illness (HPI) 10/26/2020 upon evaluation today patient appears to be doing somewhat poorly in regard to wounds on her left heel and left lower leg. The leg is really not nearly as bad as the heel ulcer however. This is an unstageable pressure ulcer unfortunately that occurred when the patient was in the hospital secondary to a stroke. At this point the patient is seen with her daughter as well and both are able to answer and respond to questions. With that being said that the biggest issue right now is that the eschar on the heel is not really stable I think that this is going to need to be addressed with more specific and appropriate dressings currently. Apparently she did see a podiatrist more recently and they did perform some sharp debridement to clear away necrotic tissue here on the heel. T be honest o the patient's daughter is not extremely pleased about that she states that they heal was completely dry and stable at that point. With that being said obviously I did not see anything at that time and I cannot really speak to what the heel look like and then but right now it definitely appears to be much softer and I do not think just Betadine is good to do the job. I think we have to focus on trying to get the eschar off of the heel at this point. The  patient is in a assisted living facility currently. With that being said the daughter also questions whether or not she is going require skilled nursing. She does have a history of diabetes mellitus type 2. She also has a history of chronic venous insufficiency. 3/15; patient admitted to the clinic last week. She has an unstageable pressure ulcer on the left heel that apparently occurred at [facility] when she was recovering from the stroke. She spent some time at the rehab unit [facility] and now is in [facility] assisted [facility]. She has home health twice a week we have been using Iodoflex on this area. It was pointed out last week that she does not have palpable pulses below the femoral we ordered arterial studies but her daughter has not heard anything about this we will recheck this. Objective Constitutional Vitals Time Taken: 2:09 PM, Height: 62 in, Source: Stated, Weight: 173 lbs, Source: Stated, BMI: 31.6, Temperature: 97.7 F, Pulse: 75 bpm, Respiratory Rate: 18 breaths/min, Blood Pressure: 165/73 mmHg. General Notes: BS monitored at facility Cardiovascular Pedal pulses absent on the left. General Notes: Wound exam; left heel. Completely necrotic nonviable surface although because of the significant PAD possibility I did not debride this. Wound is somewhat tender but without evidence of infection Integumentary (Hair, Skin) Wound #1 status is Open. Original cause of wound was Pressure Injury. The date acquired was: 09/21/2020. The wound has been in treatment 1 weeks. The wound is located on the Left Calcaneus. The wound measures 1.5cm length x 2.8cm width x 0.1cm depth; 3.299cm^2 area and 0.33cm^3 volume. There is Fat Layer (Subcutaneous Tissue) exposed. There is no tunneling or undermining noted. There is a none present amount of drainage noted. The wound margin is flat and intact. There is no granulation within the wound bed. There is a large (67-100%) amount  of necrotic tissue  within the wound bed including Eschar. Wound #2 status is Open. Original cause of wound was Pressure Injury. The date acquired was: 10/19/2020. The wound has been in treatment 1 weeks. The wound is located on the Left,Medial Lower Leg. The wound measures 0.3cm length x 1.1cm width x 0.1cm depth; 0.259cm^2 area and 0.026cm^3 volume. There is Fat Layer (Subcutaneous Tissue) exposed. There is no tunneling or undermining noted. There is a none present amount of drainage noted. The wound margin is flat and intact. There is no granulation within the wound bed. There is a large (67-100%) amount of necrotic tissue within the wound bed including Eschar. Assessment Active Problems ICD-10 Venous insufficiency (chronic) (peripheral) Non-pressure chronic ulcer of other part of left lower leg with fat layer exposed Type 2 diabetes mellitus with foot ulcer Pressure ulcer of left heel, unstageable Plan Follow-up Appointments: Return Appointment in 1 week. Bathing/ Shower/ Hygiene: May shower with protection but do not get wound dressing(s) wet. Edema Control - Lymphedema / SCD / Other: Elevate legs to the level of the heart or above for 30 minutes daily and/or when sitting, a frequency of: Avoid standing for long periods of time. Moisturize legs daily. Off-Loading: Other: - offloading shoe for left heel Home Health: No change in wound care orders this week; continue Home Health for wound care. May utilize formulary equivalent dressing for wound treatment orders unless otherwise specified. - Home health to change twice a week WOUND #1: - Calcaneus Wound Laterality: Left Cleanser: Wound Cleanser (Home Health) Every Other Day/15 Days Discharge Instructions: Cleanse the wound with wound cleanser prior to applying a clean dressing using gauze sponges, not tissue or cotton balls. Prim Dressing: Iodosorb Gel 10 (gm) Tube [facility]) Every Other Day/15 Days ary Discharge Instructions: Apply to wound bed as  instructed Secondary Dressing: Woven Gauze Sponge, Non-Sterile 4x4 in [facility]) Every Other Day/15 Days Discharge Instructions: Apply over primary dressing as directed. Secondary Dressing: ABD Pad, 5x9 [facility]) Every Other Day/15 Days Discharge Instructions: Apply over primary dressing as directed. Secured With: [employer], 4.5x3.1 (in/yd) [facility]) Every Other Day/15 Days Discharge Instructions: Secure with Kerlix as directed. Secured With: 55M Medipore H Soft Cloth Surgical T 4 x 2 (in/yd) (Home Health) Every Other Day/15 Days ape Discharge Instructions: Secure dressing with tape as directed. WOUND #2: - Lower Leg Wound Laterality: Left, Medial Cleanser: Wound Cleanser (Home Health) Every Other Day/15 Days Discharge Instructions: Cleanse the wound with wound cleanser prior to applying a clean dressing using gauze sponges, not tissue or cotton balls. Prim Dressing: Iodosorb Gel 10 (gm) Tube [facility]) Every Other Day/15 Days ary Discharge Instructions: Apply to wound bed as instructed Secondary Dressing: Woven Gauze Sponge, Non-Sterile 4x4 in [facility]) Every Other Day/15 Days Discharge Instructions: Apply over primary dressing as directed. Secondary Dressing: ABD Pad, 5x9 [facility]) Every Other Day/15 Days Discharge Instructions: Apply over primary dressing as directed. Secured With: [employer], 4.5x3.1 (in/yd) [facility]) Every Other Day/15 Days Discharge Instructions: Secure with Kerlix as directed. Secured With: 55M Medipore H Soft Cloth Surgical T 4 x 2 (in/yd) (Home Health) Every Other Day/15 Days ape Discharge Instructions: Secure dressing with tape as directed. 1. I am continuing with the Iodoflex. 2. Thanks the diligent effort of our staff the patient now has her arterial studies booked for Thursday this week. 3. If the blood flow was adequate she is going to need debridement of this wound bed then we will  see how much damage there is  underneath this area. 4. If this is insufficient in terms of blood flow I will need to have a discussion with the patient and her daughter Electronic Signature(s) Signed: 11/02/2020 5:48:16 PM By: Linton Ham MD Entered By: Linton Ham on 11/02/2020 16:00:36 -------------------------------------------------------------------------------- SuperBill Details Patient Name: Date of Service: Dorothy Barker 11/02/2020 Medical Record Number: [id number] Patient Account Number: [id number] Date of Birth/Sex: Treating RN: 1941/08/04 (80 y.o. Dorothy Barker, Dorothy Barker Primary Care Provider: PA TIENT, NO Other Clinician: Referring Provider: Treating Provider/Extender: Cheree Ditto in Treatment: 1 Diagnosis Coding ICD-10 Codes Code Description I87.2 Venous insufficiency (chronic) (peripheral) L97.822 Non-pressure chronic ulcer of other part of left lower leg with fat layer exposed E11.621 Type 2 diabetes mellitus with foot ulcer L89.620 Pressure ulcer of left heel, unstageable Facility Procedures CPT4 Code: [id number] Description: 99213 - WOUND CARE VISIT-LEV 3 EST PT Modifier: Quantity: 1 Physician Procedures : CPT4 Code Description Modifier [id number] 99213 - WC PHYS LEVEL 3 - EST PT ICD-10 Diagnosis Description E11.621 Type 2 diabetes mellitus with foot ulcer L89.620 Pressure ulcer of left heel, unstageable Quantity: 1 Electronic Signature(s) Signed: 11/02/2020 5:48:16 PM By: Linton Ham MD Entered By: Linton Ham on 11/02/2020 16:01:03

## 2020-11-04 ENCOUNTER — Other Ambulatory Visit (HOSPITAL_COMMUNITY): Payer: Self-pay | Admitting: Internal Medicine

## 2020-11-04 ENCOUNTER — Ambulatory Visit (INDEPENDENT_AMBULATORY_CARE_PROVIDER_SITE_OTHER)
Admission: RE | Admit: 2020-11-04 | Discharge: 2020-11-04 | Disposition: A | Discharge status: Home / Self Care | Payer: Medicare PPO | Source: Ambulatory Visit | Attending: Internal Medicine | Admitting: Internal Medicine

## 2020-11-04 ENCOUNTER — Ambulatory Visit (HOSPITAL_COMMUNITY)
Admission: RE | Admit: 2020-11-04 | Discharge: 2020-11-04 | Disposition: A | Discharge status: Home / Self Care | Payer: Medicare PPO | Source: Ambulatory Visit | Attending: Internal Medicine | Admitting: Internal Medicine

## 2020-11-04 ENCOUNTER — Other Ambulatory Visit: Payer: Self-pay

## 2020-11-04 DIAGNOSIS — I739 Peripheral vascular disease, unspecified: Secondary | ICD-10-CM | POA: Insufficient documentation

## 2020-11-04 DIAGNOSIS — L97822 Non-pressure chronic ulcer of other part of left lower leg with fat layer exposed: Secondary | ICD-10-CM

## 2020-11-05 ENCOUNTER — Ambulatory Visit: Payer: Self-pay | Admitting: Physician Assistant

## 2020-11-08 ENCOUNTER — Encounter (HOSPITAL_BASED_OUTPATIENT_CLINIC_OR_DEPARTMENT_OTHER): Payer: Self-pay | Admitting: Internal Medicine

## 2020-11-09 ENCOUNTER — Encounter (HOSPITAL_BASED_OUTPATIENT_CLINIC_OR_DEPARTMENT_OTHER): Payer: Medicare PPO | Admitting: Internal Medicine

## 2020-11-09 ENCOUNTER — Other Ambulatory Visit: Payer: Self-pay

## 2020-11-09 NOTE — Progress Notes (Signed)
Dorothy Barker, Dorothy Barker ([id number]) Visit Report for 11/09/2020 HPI Details Patient Name: Date of Service: Dorothy Barker, Dorothy Barker 11/09/2020 1:15 PM Medical Record Number: [id number] Patient Account Number: [id number] Date of Birth/Sex: Treating RN: November 15, 1940 (80 y.o. Elam Dutch Primary Care Provider: PA Haig Prophet, [place] Other Clinician: Referring Provider: Treating Provider/Extender: Cheree Ditto in Treatment: 2 History of Present Illness HPI Description: 10/26/2020 upon evaluation today patient appears to be doing somewhat poorly in regard to wounds on her left heel and left lower leg. The leg is really not nearly as bad as the heel ulcer however. This is an unstageable pressure ulcer unfortunately that occurred when the patient was in the hospital secondary to a stroke. At this point the patient is seen with her daughter as well and both are able to answer and respond to questions. With that being said that the biggest issue right now is that the eschar on the heel is not really stable I think that this is going to need to be addressed with more specific and appropriate dressings currently. Apparently she did see a podiatrist more recently and they did perform some sharp debridement to clear away necrotic tissue here on the heel. T be honest the patient's daughter is not extremely pleased about that she states that they heal was completely dry and stable at that point. With that o being said obviously I did not see anything at that time and I cannot really speak to what the heel look like and then but right now it definitely appears to be much softer and I do not think just Betadine is good to do the job. I think we have to focus on trying to get the eschar off of the heel at this point. The patient is in a assisted living facility currently. With that being said the daughter also questions whether or not she is going require skilled nursing. She does have a history of diabetes mellitus type 2. She  also has a history of chronic venous insufficiency. 3/15; patient admitted to the clinic last week. She has an unstageable pressure ulcer on the left heel that apparently occurred at [facility] when she was recovering from the stroke. She spent some time at [facility] and now is in [place] assisted living. She has home health twice a week we have been using Iodoflex on this area. It was pointed out last week that she does not have palpable pulses below the femoral we ordered arterial studies but her daughter has not heard anything about this we will recheck this. 2/22; unstageable pressure ulcer on the left heel and an area on her left posterior calf. She had her this showed an ABI in the left of 1.15 however monophasic waveforms in the great toe TBI of 0.18 with a pressure of 39. Doppler analysis revealed monophasic waveforms at the CFA which by my way of thinking might suggest proximal disease she had a 50-74 stenosis of the distal SFA with a focal velocity elevation of 357 cm/s. We have been using Iodoflex to both wound areas. The patient does not really complain of a lot of pain but she is not that ambulatory. Electronic Signature(s) Signed: 11/09/2020 5:50:43 PM By: Linton Ham MD Entered By: Linton Ham on 11/09/2020 15:10:39 -------------------------------------------------------------------------------- Physical Exam Details Patient Name: Date of Service: Dorothy Barker, Dorothy Barker 11/09/2020 1:15 PM Medical Record Number: [id number] Patient Account Number: [id number] Date of Birth/Sex: Treating RN: 1940-12-09 (80 y.o. F) Baruch Gouty Primary  Care Provider: PA Haig Prophet, [place] Other Clinician: Referring Provider: Treating Provider/Extender: Cheree Ditto in Treatment: 2 Constitutional Patient is hypertensive.. Pulse regular and within target range for patient.Marland Kitchen Respirations regular, non-labored and within target range.. Temperature is normal and within the  target range for the patient.Marland Kitchen Appears in no distress. Cardiovascular I cannot feel her femoral or her popliteal pulse on the left. I cannot feel either pedal pulse on the left. Notes Wound exam; left heel completely necrotic black surface. There is a smaller wound in the left posterior calf but also has a necrotic black surface. No debridement done in either area Electronic Signature(s) Signed: 11/09/2020 5:50:43 PM By: Linton Ham MD Entered By: Linton Ham on 11/09/2020 15:11:34 -------------------------------------------------------------------------------- Physician Orders Details Patient Name: Date of Service: Dorothy Barker, Dorothy Barker 11/09/2020 1:15 PM Medical Record Number: [id number] Patient Account Number: [id number] Date of Birth/Sex: Treating RN: 1940/10/20 (81 y.o. Benjaman Lobe Primary Care Provider: PA Haig Prophet, [place] Other Clinician: Referring Provider: Treating Provider/Extender: Cheree Ditto in Treatment: 2 Verbal / Phone Orders: No Diagnosis Coding Follow-up Appointments Return Appointment in 1 week. Bathing/ Shower/ Hygiene May shower with protection but do not get wound dressing(s) wet. Edema Control - Lymphedema / SCD / Other Elevate legs to the level of the heart or above for 30 minutes daily and/or when sitting, a frequency of: Avoid standing for long periods of time. Moisturize legs daily. Off-Loading Other: - offloading shoe for left heel when pt. is up and walking. Pt. may wear pressure-relieving boots at bedtime and also while she's in the wheelchair to reduce pressure on the L heel. Home Health No change in wound care orders this week; continue Home Health for wound care. May utilize formulary equivalent dressing for wound treatment orders unless otherwise specified. - Home health to change twice a week Wound Treatment Wound #1 - Calcaneus Wound Laterality: Left Cleanser: Wound Cleanser (Home Health) Every Other Day/15 Days Discharge  Instructions: Cleanse the wound with wound cleanser prior to applying a clean dressing using gauze sponges, not tissue or cotton balls. Prim Dressing: Iodosorb Gel 10 (gm) Tube [facility]) Every Other Day/15 Days ary Discharge Instructions: Apply to wound bed as instructed Secondary Dressing: Woven Gauze Sponge, Non-Sterile 4x4 in [facility]) Every Other Day/15 Days Discharge Instructions: Apply over primary dressing as directed. Secondary Dressing: ABD Pad, 5x9 [facility]) Every Other Day/15 Days Discharge Instructions: Apply over primary dressing as directed. Secured With: [employer], 4.5x3.1 (in/yd) [facility]) Every Other Day/15 Days Discharge Instructions: Secure with Kerlix as directed. Secured With: 52M Medipore H Soft Cloth Surgical T 4 x 2 (in/yd) (Home Health) Every Other Day/15 Days ape Discharge Instructions: Secure dressing with tape as directed. Wound #2 - Lower Leg Wound Laterality: Left, Medial Cleanser: Wound Cleanser (Home Health) Every Other Day/15 Days Discharge Instructions: Cleanse the wound with wound cleanser prior to applying a clean dressing using gauze sponges, not tissue or cotton balls. Prim Dressing: Iodosorb Gel 10 (gm) Tube [facility]) Every Other Day/15 Days ary Discharge Instructions: Apply to wound bed as instructed Secondary Dressing: Woven Gauze Sponge, Non-Sterile 4x4 in [facility]) Every Other Day/15 Days Discharge Instructions: Apply over primary dressing as directed. Secondary Dressing: ABD Pad, 5x9 [facility]) Every Other Day/15 Days Discharge Instructions: Apply over primary dressing as directed. Secured With: [employer], 4.5x3.1 (in/yd) [facility]) Every Other Day/15 Days Discharge Instructions: Secure with Kerlix as directed. Secured With: 52M Medipore H Soft Cloth Surgical T 4 x 2 (  in/yd) [facility]) Every Other Day/15 Days ape Discharge Instructions: Secure dressing with tape as directed. Consults Vascular -  Vascular consult for abnormal TBI'S and non healing left heel ulcer Electronic Signature(s) Signed: 11/09/2020 5:23:06 PM By: Rhae Hammock RN Signed: 11/09/2020 5:50:43 PM By: Linton Ham MD Entered By: Rhae Hammock on 11/09/2020 14:45:00 Prescription 11/09/2020 -------------------------------------------------------------------------------- Carren Rang MD Patient Name: Provider: 23-May-1941 [id number] Date of Birth: NPI#Wanda Plump [id number] Sex: DEA #: [phone] Phone #: License #: [place] Patient Address: [place], [place] [address] [place], [place] [address] [phone] Allergies Lotrel; codeine; tramadol Provider's Orders Vascular - Vascular consult for abnormal TBI'S and non healing left heel ulcer Hand Signature: Date(s): Electronic Signature(s) Signed: 11/09/2020 5:23:06 PM By: Rhae Hammock RN Signed: 11/09/2020 5:50:43 PM By: Linton Ham MD Entered By: Rhae Hammock on 11/09/2020 14:45:00 -------------------------------------------------------------------------------- Problem List Details Patient Name: Date of Service: Dorothy Barker, Dorothy Barker 11/09/2020 1:15 PM Medical Record Number: [id number] Patient Account Number: [id number] Date of Birth/Sex: Treating RN: September 07, 1940 (80 y.o. 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 Elam Dutch Primary Care Provider: PA Haig Prophet, [place] Other Clinician: Referring Provider: Treating Provider/Extender: Cheree Ditto in Treatment: 2 Subjective History of Present Illness (HPI) 10/26/2020 upon evaluation today patient appears to be doing somewhat poorly in regard to wounds on her left heel and left lower leg. The leg is really not nearly as bad as the heel ulcer however. This is an unstageable pressure ulcer unfortunately that occurred when the patient was in the hospital secondary to a stroke. At this point the patient is seen with her daughter as well and both are able to answer and respond to questions. With that being said that the biggest issue right now is that the eschar on the heel is not really stable I think that this is going to need to be addressed with more specific and appropriate dressings currently. Apparently she did see a podiatrist more recently and they did perform some sharp debridement to clear away necrotic tissue here on the heel. T be honest o the patient's daughter is not extremely pleased about that she states that they heal was completely dry and stable at that point. With that being said obviously I did not see anything at that time and I cannot really speak to what the heel look like and then but right now it definitely appears to be much softer and I do not think just Betadine is good to do the job. I think we have to focus on trying to get the eschar off of the heel at this point. The patient is in a assisted  living facility currently. With that being said the daughter  also questions whether or not she is going require skilled nursing. She does have a history of diabetes mellitus type 2. She also has a history of chronic venous insufficiency. 3/15; patient admitted to the clinic last week. She has an unstageable pressure ulcer on the left heel that apparently occurred at [facility] when she was recovering from the stroke. She spent some time at the rehab unit [facility] and now is in [place] assisted living. She has home health twice a week we have been using Iodoflex on this area. It was pointed out last week that she does not have palpable pulses below the femoral we ordered arterial studies but her daughter has not heard anything about this we will recheck this. 2/22; unstageable pressure ulcer on the left heel and an area on her left posterior calf. She had her this showed an ABI in the left of 1.15 however monophasic waveforms in the great toe TBI of 0.18 with a pressure of 39. Doppler analysis revealed monophasic waveforms at the CFA which by my way of thinking might suggest proximal disease she had a 50-74 stenosis of the distal SFA with a focal velocity elevation of 357 cm/s. We have been using Iodoflex to both wound areas. The patient does not really complain of a lot of pain but she is not that ambulatory. Objective Constitutional Patient is hypertensive.. Pulse regular and within target range for patient.Marland Kitchen Respirations regular, non-labored and within target range.. Temperature is normal and within the target range for the patient.Marland Kitchen Appears in no distress. Vitals Time Taken: 1:56 PM, Height: 62 in, Weight: 173 lbs, BMI: 31.6, Temperature: 97.8 F, Pulse: 93 bpm, Respiratory Rate: 18 breaths/min, Blood Pressure: 171/84 mmHg. Cardiovascular I cannot feel her femoral or her popliteal pulse on the left. I cannot feel either pedal pulse on the left. General Notes: Wound exam; left heel  completely necrotic black surface. There is a smaller wound in the left posterior calf but also has a necrotic black surface. No debridement done in either area Integumentary (Hair, Skin) Wound #1 status is Open. Original cause of wound was Pressure Injury. The date acquired was: 09/21/2020. The wound has been in treatment 2 weeks. The wound is located on the Left Calcaneus. The wound measures 1.7cm length x 2.8cm width x 0.1cm depth; 3.738cm^2 area and 0.374cm^3 volume. There is Fat Layer (Subcutaneous Tissue) exposed. There is no tunneling or undermining noted. There is a small amount of serous drainage noted. The wound margin is flat and intact. There is no granulation within the wound bed. There is a large (67-100%) amount of necrotic tissue within the wound bed including Eschar. Wound #2 status is Open. Original cause of wound was Pressure Injury. The date acquired was: 10/19/2020. The wound has been in treatment 2 weeks. The wound is located on the Left,Medial Lower Leg. The wound measures 0.4cm length x 0.8cm width x 0.1cm depth; 0.251cm^2 area and 0.025cm^3 volume. There is Fat Layer (Subcutaneous Tissue) exposed. There is no tunneling or undermining noted. There is a none present amount of drainage noted. The wound margin is flat and intact. There is no granulation within the wound bed. There is a large (67-100%) amount of necrotic tissue within the wound bed including Eschar. Assessment Active Problems ICD-10 Pressure ulcer of left heel, unstageable Venous insufficiency (chronic) (peripheral) Type 2 diabetes mellitus with foot ulcer Non-pressure chronic ulcer of other part of left lower leg with fat layer exposed Plan Follow-up Appointments: Return Appointment in 1 week.  Bathing/ Shower/ Hygiene: May shower with protection but do not get wound dressing(s) wet. Edema Control - Lymphedema / SCD / Other: Elevate legs to the level of the heart or above for 30 minutes daily and/or when  sitting, a frequency of: Avoid standing for long periods of time. Moisturize legs daily. Off-Loading: Other: - offloading shoe for left heel when pt. is up and walking. Pt. may wear pressure-relieving boots at bedtime and also while she's in the wheelchair to reduce pressure on the L heel. Home Health: No change in wound care orders this week; continue Home Health for wound care. May utilize formulary equivalent dressing for wound treatment orders unless otherwise specified. - Home health to change twice a week Consults ordered were: Vascular - Vascular consult for abnormal TBI'S and non healing left heel ulcer WOUND #1: - Calcaneus Wound Laterality: Left Cleanser: Wound Cleanser (Home Health) Every Other Day/15 Days Discharge Instructions: Cleanse the wound with wound cleanser prior to applying a clean dressing using gauze sponges, not tissue or cotton balls. Prim Dressing: Iodosorb Gel 10 (gm) Tube [facility]) Every Other Day/15 Days ary Discharge Instructions: Apply to wound bed as instructed Secondary Dressing: Woven Gauze Sponge, Non-Sterile 4x4 in [facility]) Every Other Day/15 Days Discharge Instructions: Apply over primary dressing as directed. Secondary Dressing: ABD Pad, 5x9 [facility]) Every Other Day/15 Days Discharge Instructions: Apply over primary dressing as directed. Secured With: [employer], 4.5x3.1 (in/yd) [facility]) Every Other Day/15 Days Discharge Instructions: Secure with Kerlix as directed. Secured With: 852M Medipore H Soft Cloth Surgical T 4 x 2 (in/yd) (Home Health) Every Other Day/15 Days ape Discharge Instructions: Secure dressing with tape as directed. WOUND #2: - Lower Leg Wound Laterality: Left, Medial Cleanser: Wound Cleanser (Home Health) Every Other Day/15 Days Discharge Instructions: Cleanse the wound with wound cleanser prior to applying a clean dressing using gauze sponges, not tissue or cotton balls. Prim Dressing: Iodosorb Gel 10  (gm) Tube [facility]) Every Other Day/15 Days ary Discharge Instructions: Apply to wound bed as instructed Secondary Dressing: Woven Gauze Sponge, Non-Sterile 4x4 in [facility]) Every Other Day/15 Days Discharge Instructions: Apply over primary dressing as directed. Secondary Dressing: ABD Pad, 5x9 [facility]) Every Other Day/15 Days Discharge Instructions: Apply over primary dressing as directed. Secured With: [employer], 4.5x3.1 (in/yd) [facility]) Every Other Day/15 Days Discharge Instructions: Secure with Kerlix as directed. Secured With: 852M Medipore H Soft Cloth Surgical T 4 x 2 (in/yd) (Home Health) Every Other Day/15 Days ape Discharge Instructions: Secure dressing with tape as directed. 1. For now I continued with Iodosorb 2. I am going to refer her to vascular surgery to look at the blood flow in the leg. Beside the very low TBI I am concerned about the monophasic waveforms in the CFA and the focal stenosis in the distal SFA. 3. Ultimately this is going to require debridement but I would prefer to look at her vascular situation before disturbing something that will not heal because of the lack of blood flow Electronic Signature(s) Signed: 11/09/2020 5:50:43 PM By: Linton Ham MD Entered By: Linton Ham on 11/09/2020 15:12:36 -------------------------------------------------------------------------------- SuperBill Details Patient Name: Date of Service: Dorothy Barker 11/09/2020 Medical Record Number: [id number] Patient Account Number: [id number] Date of Birth/Sex: Treating RN: 01-Feb-1941 (79 y.o. Elam Dutch Primary Care Provider: PA Haig Prophet, [place] Other Clinician: Referring Provider: Treating Provider/Extender: Cheree Ditto in Treatment: 2 Diagnosis Coding ICD-10 Codes Code Description [phone] Pressure ulcer of left  heel, unstageable I87.2 Venous insufficiency (chronic) (peripheral) E11.621 Type 2 diabetes mellitus with foot  ulcer L97.822 Non-pressure chronic ulcer of other part of left lower leg with fat layer exposed Physician Procedures : CPT4 Code Description Modifier [id number] - WC PHYS LEVEL 3 - EST PT ICD-10 Diagnosis Description L89.620 Pressure ulcer of left heel, unstageable E11.621 Type 2 diabetes mellitus with foot ulcer Quantity: 1 Electronic Signature(s) Signed: 11/09/2020 5:50:43 PM By: Linton Ham MD Entered By: Linton Ham on 11/09/2020 15:13:07

## 2020-11-10 NOTE — Progress Notes (Signed)
Dorothy Barker, Dorothy Barker ([id number]) Visit Report for 11/09/2020 Arrival Information Details Patient Name: Date of Service: Dorothy Barker, Dorothy Barker 11/09/2020 1:15 PM Medical Record Number: [id number] Patient Account Number: [id number] Date of Birth/Sex: Treating RN: 1940/12/22 (80 y.o. Martyn Malay, Linda Primary Care Katura Eatherly: PA Haig [place], [place] Other Clinician: Referring Evarose Altland: Treating Khrista Braun/Extender: Cheree Ditto in Treatment: 2 Visit Information History Since Last Visit Added or deleted any medications: No Patient Arrived: Wheel Chair Any new allergies or adverse reactions: No Arrival Time: 13:54 Had a fall or experienced change in No Accompanied By: daughter activities of daily living that may affect Transfer Assistance: None risk of falls: Patient Identification Verified: Yes Signs or symptoms of abuse/neglect since last visito No Secondary Verification Process Completed: Yes Hospitalized since last visit: No Patient Requires Transmission-Based Precautions: No Implantable device outside of the clinic excluding No Patient Has Alerts: No cellular tissue based products placed in the center since last visit: Has Dressing in Place as Prescribed: Yes Pain Present Now: No Electronic Signature(s) Signed: 11/10/2020 7:45:09 AM By: Sandre Kitty Entered By: Sandre Kitty on 11/09/2020 13:56:50 -------------------------------------------------------------------------------- Clinic Level of Care Assessment Details Patient Name: Date of Service: Dorothy Barker, Dorothy Barker 11/09/2020 1:15 PM Medical Record Number: [id number] Patient Account Number: [id number] Date of Birth/Sex: Treating RN: [birth year] (80 y.o. [name], [name] Primary Care Ticia Virgo: PA TIENT, NO Other Clinician: Referring Arvon Schreiner: Treating Ravynn Hogate/Extender: Cheree Ditto in Treatment: 2 Clinic Level of Care Assessment Items TOOL 4 Quantity Score X- 1 0 Use when only an EandM is performed on FOLLOW-UP  visit ASSESSMENTS - Nursing Assessment / Reassessment X- 1 10 Reassessment of Co-morbidities (includes updates in patient status) X- 1 5 Reassessment of Adherence to Treatment Plan ASSESSMENTS - Wound and Skin A ssessment / Reassessment X - Simple Wound Assessment / Reassessment - one wound 1 5 []  - 0 Complex Wound Assessment / Reassessment - multiple wounds X- 1 10 Dermatologic / Skin Assessment (not related to wound area) ASSESSMENTS - Focused Assessment X- 1 5 Circumferential Edema Measurements - multi extremities X- 1 10 Nutritional Assessment / Counseling / Intervention X- 1 5 Lower Extremity Assessment (monofilament, tuning fork, pulses) []  - 0 Peripheral Arterial Disease Assessment (using hand held doppler) ASSESSMENTS - Ostomy and/or Continence Assessment and Care []  - 0 Incontinence Assessment and Management []  - 0 Ostomy Care Assessment and Management (repouching, etc.) PROCESS - Coordination of Care []  - 0 Simple Patient / Family Education for ongoing care X- 1 20 Complex (extensive) Patient / Family Education for ongoing care X- 1 10 Staff obtains Programmer, systems, Records, T Results / Process Orders est []  - 0 Staff telephones HHA, Nursing Homes / Clarify orders / etc []  - 0 Routine Transfer to another Facility (non-emergent condition) []  - 0 Routine Hospital Admission (non-emergent condition) []  - 0 New Admissions / Biomedical engineer / Ordering NPWT Apligraf, etc. , []  - 0 Emergency Hospital Admission (emergent condition) X- 1 10 Simple Discharge Coordination []  - 0 Complex (extensive) Discharge Coordination PROCESS - Special Needs []  - 0 Pediatric / Minor Patient Management []  - 0 Isolation Patient Management []  - 0 Hearing / Language / Visual special needs []  - 0 Assessment of Community assistance (transportation, D/C planning, etc.) []  - 0 Additional assistance / Altered mentation []  - 0 Support Surface(s) Assessment (bed, cushion, seat,  etc.) INTERVENTIONS - Wound Cleansing / Measurement X - Simple Wound Cleansing - one wound 1 5 []  - 0 Complex Wound Cleansing - multiple wounds X- 1 5 Wound  Imaging (photographs - any number of wounds) []  - 0 Wound Tracing (instead of photographs) X- 1 5 Simple Wound Measurement - one wound []  - 0 Complex Wound Measurement - multiple wounds INTERVENTIONS - Wound Dressings X - Small Wound Dressing one or multiple wounds 1 10 []  - 0 Medium Wound Dressing one or multiple wounds []  - 0 Large Wound Dressing one or multiple wounds X- 1 5 Application of Medications - topical []  - 0 Application of Medications - injection INTERVENTIONS - Miscellaneous []  - 0 External ear exam []  - 0 Specimen Collection (cultures, biopsies, blood, body fluids, etc.) []  - 0 Specimen(s) / Culture(s) sent or taken to Lab for analysis []  - 0 Patient Transfer (multiple staff / Harrel Lemon Lift / Similar devices) []  - 0 Simple Staple / Suture removal (25 or less) []  - 0 Complex Staple / Suture removal (26 or more) []  - 0 Hypo / Hyperglycemic Management (close monitor of Blood Glucose) []  - 0 Ankle / Brachial Index (ABI) - do not check if billed separately X- 1 5 Vital Signs Has the patient been seen at the hospital within the last three years: Yes Total Score: 125 Level Of Care: New/Established - Level 4 Electronic Signature(s) Signed: 11/09/2020 5:23:06 PM By: Rhae Hammock RN Entered By: Rhae Hammock on 11/09/2020 14:43:11 -------------------------------------------------------------------------------- Encounter Discharge Information Details Patient Name: Date of Service: Dorothy Barker 11/09/2020 1:15 PM Medical Record Number: [id number] Patient Account Number: [id number] Date of Birth/Sex: Treating RN: May 06, 1941 (80 y.o. Sue Lush Primary Care Murray Guzzetta: PA Haig [place], [place] Other Clinician: Referring Brayant Dorr: Treating Lexia Vandevender/Extender: Cheree Ditto in Treatment: 2 Encounter  Discharge Information Items Discharge Condition: Stable Ambulatory Status: Wheelchair Discharge Destination: Home Transportation: Private Auto Accompanied By: Daughter Schedule Follow-up Appointment: Yes Clinical Summary of Care: Provided on 11/09/2020 Form Type Recipient Paper Patient Patient Electronic Signature(s) Signed: 11/09/2020 2:40:37 PM By: Lorrin Jackson Entered By: Lorrin Jackson on 11/09/2020 14:40:37 -------------------------------------------------------------------------------- Lower Extremity Assessment Details Patient Name: Date of Service: Dorothy Barker, Dorothy Barker 11/09/2020 1:15 PM Medical Record Number: [id number] Patient Account Number: [id number] Date of Birth/Sex: Treating RN: April 21, 1941 (80 y.o. Elam Dutch Primary Care Quaid Yeakle: PA Haig [place], [place] Other Clinician: Referring Keiri Solano: Treating Zeya Balles/Extender: Cheree Ditto in Treatment: 2 Edema Assessment Assessed: [Left: No] [Right: No] Edema: [Left: Ye] [Right: s] Calf Left: Right: Point of Measurement: From Medial Instep 35 cm Ankle Left: Right: Point of Measurement: From Medial Instep 21 cm Vascular Assessment Pulses: Dorsalis Pedis Palpable: [Left:No] Electronic Signature(s) Signed: 11/09/2020 5:27:17 PM By: Baruch Gouty RN, BSN Entered By: Baruch Gouty on 11/09/2020 14:12:19 -------------------------------------------------------------------------------- Multi Wound Chart Details Patient Name: Date of Service: Dorothy Barker 11/09/2020 1:15 PM Medical Record Number: [id number] Patient Account Number: [id number] Date of Birth/Sex: Treating RN: 14-Jul-1941 (80 y.o. Elam Dutch Primary Care Jayel Scaduto: PA Haig [place], [place] Other Clinician: Referring Aaryn Parrilla: Treating Tradarius Reinwald/Extender: Cheree Ditto in Treatment: 2 Vital Signs Height(in): 22 Pulse(bpm): 23 Weight(lbs): 173 Blood Pressure(mmHg): 171/84 Body Mass Index(BMI): 32 Temperature(F): 97.8 Respiratory  Rate(breaths/min): 18 Photos: [1:No Photos Left Calcaneus] [2:No Photos Left, Medial Lower Leg] [N/A:N/A N/A] Wound Location: [1:Pressure Injury] [2:Pressure Injury] [N/A:N/A] Wounding Event: [1:Pressure Ulcer] [2:Pressure Ulcer] [N/A:N/A] Primary Etiology: [1:Diabetic Wound/Ulcer of the Lower] [2:N/A] [N/A:N/A] Secondary Etiology: [1:Extremity Cataracts, Hypertension, Type II] [2:Cataracts, Hypertension, Type II] [N/A:N/A] Comorbid History: [1:Diabetes, Osteoarthritis, Dementia 09/21/2020] [2:Diabetes, Osteoarthritis, Dementia 10/19/2020] [N/A:N/A] Date Acquired: [1:2] [2:2] [N/A:N/A] Weeks of Treatment: [1:Open] [2:Open] [N/A:N/A] Wound Status: [1:1.7x2.8x0.1] [2:0.4x0.8x0.1] [N/A:N/A] Measurements L x W x  D (cm) [1:3.738] [2:0.251] [N/A:N/A] A (cm) : rea [1:0.374] [2:0.025] [N/A:N/A] Volume (cm) : [1:5.60%] [2:-6.40%] [N/A:N/A] % Reduction in A rea: [1:5.60%] [2:-4.20%] [N/A:N/A] % Reduction in Volume: [1:Unstageable/Unclassified] [2:Unstageable/Unclassified] [N/A:N/A] Classification: [1:Small] [2:None Present] [N/A:N/A] Exudate A mount: [1:Serous] [2:N/A] [N/A:N/A] Exudate Type: [1:amber] [2:N/A] [N/A:N/A] Exudate Color: [1:Flat and Intact] [2:Flat and Intact] [N/A:N/A] Wound Margin: [1:None Present (0%)] [2:None Present (0%)] [N/A:N/A] Granulation A mount: [1:Large (67-100%)] [2:Large (67-100%)] [N/A:N/A] Necrotic A mount: [1:Eschar] [2:Eschar] [N/A:N/A] Necrotic Tissue: [1:Fat Layer (Subcutaneous Tissue): Yes Fat Layer (Subcutaneous Tissue): Yes N/A] Exposed Structures: [1:Fascia: No Tendon: No Muscle: No Joint: No Bone: No Small (1-33%)] [2:Fascia: No Tendon: No Muscle: No Joint: No Bone: No Small (1-33%)] [N/A:N/A] Treatment Notes Wound #1 (Calcaneus) Wound Laterality: Left Cleanser Wound Cleanser Discharge Instruction: Cleanse the wound with wound cleanser prior to applying a clean dressing using gauze sponges, not tissue or cotton balls. Peri-Wound Care Topical Primary  Dressing Iodosorb Gel 10 (gm) Tube Discharge Instruction: Apply to wound bed as instructed Secondary Dressing Woven Gauze Sponge, Non-Sterile 4x4 in Discharge Instruction: Apply over primary dressing as directed. ABD Pad, 5x9 Discharge Instruction: Apply over primary dressing as directed. Secured With [employer], 4.5x3.1 (in/yd) Discharge Instruction: Secure with Kerlix as directed. 64M Medipore H Soft Cloth Surgical T 4 x 2 (in/yd) ape Discharge Instruction: Secure dressing with tape as directed. Compression Wrap Compression Stockings Add-Ons Wound #2 (Lower Leg) Wound Laterality: Left, Medial Cleanser Wound Cleanser Discharge Instruction: Cleanse the wound with wound cleanser prior to applying a clean dressing using gauze sponges, not tissue or cotton balls. Peri-Wound Care Topical Primary Dressing Iodosorb Gel 10 (gm) Tube Discharge Instruction: Apply to wound bed as instructed Secondary Dressing Woven Gauze Sponge, Non-Sterile 4x4 in Discharge Instruction: Apply over primary dressing as directed. ABD Pad, 5x9 Discharge Instruction: Apply over primary dressing as directed. Secured With [employer], 4.5x3.1 (in/yd) Discharge Instruction: Secure with Kerlix as directed. 64M Medipore H Soft Cloth Surgical T 4 x 2 (in/yd) ape Discharge Instruction: Secure dressing with tape as directed. Compression Wrap Compression Stockings Add-Ons Electronic Signature(s) Signed: 11/09/2020 5:27:17 PM By: Baruch Gouty RN, BSN Signed: 11/09/2020 5:50:43 PM By: Linton Ham MD Entered By: Linton Ham on 11/09/2020 15:08:41 -------------------------------------------------------------------------------- Multi-Disciplinary Care Plan Details Patient Name: Date of Service: Dorothy Barker, Dorothy Barker 11/09/2020 1:15 PM Medical Record Number: [id number] Patient Account Number: [id number] Date of Birth/Sex: Treating RN: Dec 25, 1940 (80 y.o. [name], [name] Primary Care  Hidaya Daniel: PA Haig [place], [place] Other Clinician: Referring Darrelyn Morro: Treating Tam Delisle/Extender: Cheree Ditto in Treatment: 2 Active Inactive Orientation to the Wound Care Program Nursing Diagnoses: Knowledge deficit related to the wound healing center program Goals: Patient/caregiver will verbalize understanding of the [place] Date Initiated: 10/26/2020 Target Resolution Date: 11/25/2020 Goal Status: Active Interventions: Provide education on orientation to the wound center Notes: Wound/Skin Impairment Nursing Diagnoses: Impaired tissue integrity Knowledge deficit related to ulceration/compromised skin integrity Goals: Patient will have a decrease in wound volume by X% from date: (specify in notes) Date Initiated: 10/26/2020 Target Resolution Date: 11/25/2020 Goal Status: Active Patient/caregiver will verbalize understanding of skin care regimen Date Initiated: 10/26/2020 Target Resolution Date: 11/25/2020 Goal Status: Active Ulcer/skin breakdown will have a volume reduction of 30% by week 4 Date Initiated: 10/26/2020 Target Resolution Date: 11/22/2020 Goal Status: Active Interventions: Assess patient/caregiver ability to obtain necessary supplies Assess patient/caregiver ability to perform ulcer/skin care regimen upon admission and as needed Assess ulceration(s) every visit Notes: Electronic Signature(s) Signed: 11/09/2020 5:23:06 PM By:  Rhae Hammock RN Entered By: Rhae Hammock on 11/09/2020 14:41:20 -------------------------------------------------------------------------------- Pain Assessment Details Patient Name: Date of Service: Dorothy Barker, Dorothy Barker 11/09/2020 1:15 PM Medical Record Number: [id number] Patient Account Number: [id number] Date of Birth/Sex: Treating RN: 07-Nov-1940 (80 y.o. Elam Dutch Primary Care Kimyetta Flott: PA Haig [place], [place] Other Clinician: Referring Kinzley Savell: Treating Chrisotpher Rivero/Extender: Cheree Ditto in Treatment: 2 Active  Problems Location of Pain Severity and Description of Pain Patient Has Paino No Site Locations Rate the pain. Rate the pain. Current Pain Level: 0 Worst Pain Level: 5 Least Pain Level: 0 Character of Pain Describe the Pain: Aching, Tender Pain Management and Medication Current Pain Management: Medication: Yes Other: offload Is the Current Pain Management Adequate: Adequate How does your wound impact your activities of daily livingo Sleep: No Bathing: No Appetite: No Relationship With Others: No Bladder Continence: No Emotions: No Bowel Continence: No Work: No Toileting: No Drive: No Dressing: No Hobbies: No Engineer, maintenance) Signed: 11/09/2020 5:27:17 PM By: Baruch Gouty RN, BSN Entered By: Baruch Gouty on 11/09/2020 14:11:52 -------------------------------------------------------------------------------- Patient/Caregiver Education Details Patient Name: Date of Service: Dorothy Barker 3/22/2022andnbsp1:15 PM Medical Record Number: [id number] Patient Account Number: [id number] Date of Birth/Gender: Treating RN: 1940/09/25 (79 y.o. Benjaman Lobe Primary Care Physician: PA Haig [place], [place] Other Clinician: Referring Physician: Treating Physician/Extender: Cheree Ditto in Treatment: 2 Education Assessment Education Provided To: Patient Education Topics Provided Welcome T [facility]: o Methods: Explain/Verbal Responses: State content correctly Electronic Signature(s) Signed: 11/09/2020 5:23:06 PM By: Rhae Hammock RN Entered By: Rhae Hammock on 11/09/2020 14:41:33 -------------------------------------------------------------------------------- Wound Assessment Details Patient Name: Date of Service: Dorothy Barker, Dorothy Barker 11/09/2020 1:15 PM Medical Record Number: [id number] Patient Account Number: [id number] Date of Birth/Sex: Treating RN: 23-Feb-1941 (80 y.o. Elam Dutch Primary Care Patience Nuzzo: PA Haig [place], [place] Other  Clinician: Referring Tyvion Edmondson: Treating Shamiyah Ngu/Extender: Cheree Ditto in Treatment: 2 Wound Status Wound Number: 1 Primary Etiology: Pressure Ulcer Wound Location: Left Calcaneus Secondary Diabetic Wound/Ulcer of the Lower Extremity Etiology: Wounding Event: Pressure Injury Wound Status: Open Date Acquired: 09/21/2020 Comorbid Cataracts, Hypertension, Type II Diabetes, Osteoarthritis, Weeks Of Treatment: 2 History: Dementia Clustered Wound: No Photos Wound Measurements Length: (cm) 1.7 Width: (cm) 2.8 Depth: (cm) 0.1 Area: (cm) 3.738 Volume: (cm) 0.374 % Reduction in Area: 5.6% % Reduction in Volume: 5.6% Epithelialization: Small (1-33%) Tunneling: No Undermining: No Wound Description Classification: Unstageable/Unclassified Wound Margin: Flat and Intact Exudate Amount: Small Exudate Type: Serous Exudate Color: amber Foul Odor After Cleansing: No Slough/Fibrino No Wound Bed Granulation Amount: None Present (0%) Exposed Structure Necrotic Amount: Large (67-100%) Fascia Exposed: No Necrotic Quality: Eschar Fat Layer (Subcutaneous Tissue) Exposed: Yes Tendon Exposed: No Muscle Exposed: No Joint Exposed: No Bone Exposed: No Treatment Notes Wound #1 (Calcaneus) Wound Laterality: Left Cleanser Wound Cleanser Discharge Instruction: Cleanse the wound with wound cleanser prior to applying a clean dressing using gauze sponges, not tissue or cotton balls. Peri-Wound Care Topical Primary Dressing Iodosorb Gel 10 (gm) Tube Discharge Instruction: Apply to wound bed as instructed Secondary Dressing Woven Gauze Sponge, Non-Sterile 4x4 in Discharge Instruction: Apply over primary dressing as directed. ABD Pad, 5x9 Discharge Instruction: Apply over primary dressing as directed. Secured With [employer], 4.5x3.1 (in/yd) Discharge Instruction: Secure with Kerlix as directed. 56M Medipore H Soft Cloth Surgical T 4 x 2 (in/yd) ape Discharge Instruction:  Secure dressing with tape as directed. Compression Wrap Compression Stockings Add-Ons Electronic Signature(s) Signed: 11/09/2020 5:27:17 PM By: Baruch Gouty RN, BSN Signed: 11/10/2020  7:45:09 AM By: Sandre Kitty Entered By: Sandre Kitty on 11/09/2020 16:21:29 -------------------------------------------------------------------------------- Wound Assessment Details Patient Name: Date of Service: Dorothy Barker, Dorothy Barker 11/09/2020 1:15 PM Medical Record Number: [id number] Patient Account Number: [id number] Date of Birth/Sex: Treating RN: September 26, 1940 (80 y.o. Elam Dutch Primary Care Anielle Headrick: PA Haig [place], [place] Other Clinician: Referring Cuyler Vandyken: Treating Trevan Messman/Extender: Cheree Ditto in Treatment: 2 Wound Status Wound Number: 2 Primary Pressure Ulcer Etiology: Wound Location: Left, Medial Lower Leg Wound Status: Open Wounding Event: Pressure Injury Comorbid Cataracts, Hypertension, Type II Diabetes, Osteoarthritis, Date Acquired: 10/19/2020 History: Dementia Weeks Of Treatment: 2 Clustered Wound: No Photos Wound Measurements Length: (cm) 0.4 Width: (cm) 0.8 Depth: (cm) 0.1 Area: (cm) 0.251 Volume: (cm) 0.025 % Reduction in Area: -6.4% % Reduction in Volume: -4.2% Epithelialization: Small (1-33%) Tunneling: No Undermining: No Wound Description Classification: Unstageable/Unclassified Wound Margin: Flat and Intact Exudate Amount: None Present Foul Odor After Cleansing: No Slough/Fibrino No Wound Bed Granulation Amount: None Present (0%) Exposed Structure Necrotic Amount: Large (67-100%) Fascia Exposed: No Necrotic Quality: Eschar Fat Layer (Subcutaneous Tissue) Exposed: Yes Tendon Exposed: No Muscle Exposed: No Joint Exposed: No Bone Exposed: No Treatment Notes Wound #2 (Lower Leg) Wound Laterality: Left, Medial Cleanser Wound Cleanser Discharge Instruction: Cleanse the wound with wound cleanser prior to applying a clean dressing using gauze  sponges, not tissue or cotton balls. Peri-Wound Care Topical Primary Dressing Iodosorb Gel 10 (gm) Tube Discharge Instruction: Apply to wound bed as instructed Secondary Dressing Woven Gauze Sponge, Non-Sterile 4x4 in Discharge Instruction: Apply over primary dressing as directed. ABD Pad, 5x9 Discharge Instruction: Apply over primary dressing as directed. Secured With [employer], 4.5x3.1 (in/yd) Discharge Instruction: Secure with Kerlix as directed. 39M Medipore H Soft Cloth Surgical T 4 x 2 (in/yd) ape Discharge Instruction: Secure dressing with tape as directed. Compression Wrap Compression Stockings Add-Ons Electronic Signature(s) Signed: 11/09/2020 5:27:17 PM By: Baruch Gouty RN, BSN Signed: 11/10/2020 7:45:09 AM By: Sandre Kitty Entered By: Sandre Kitty on 11/09/2020 16:21:56 -------------------------------------------------------------------------------- Vitals Details Patient Name: Date of Service: Dorothy Barker, Dorothy Barker 11/09/2020 1:15 PM Medical Record Number: [id number] Patient Account Number: [id number] Date of Birth/Sex: Treating RN: [birth year] (80 y.o. Elam Dutch Primary Care Teneisha Gignac: PA Haig [place], [place] Other Clinician: Referring Temiloluwa Laredo: Treating Miyoshi Ligas/Extender: Cheree Ditto in Treatment: 2 Vital Signs Time Taken: 13:56 Temperature (F): 97.8 Height (in): 62 Pulse (bpm): 93 Weight (lbs): 173 Respiratory Rate (breaths/min): 18 Body Mass Index (BMI): 31.6 Blood Pressure (mmHg): 171/84 Reference Range: 80 - 120 mg / dl Electronic Signature(s) Signed: 11/10/2020 7:45:09 AM By: Sandre Kitty Entered By: Sandre Kitty on 11/09/2020 13:57:08

## 2020-11-16 ENCOUNTER — Other Ambulatory Visit: Payer: Self-pay

## 2020-11-16 ENCOUNTER — Encounter (HOSPITAL_BASED_OUTPATIENT_CLINIC_OR_DEPARTMENT_OTHER): Payer: Medicare PPO | Admitting: Internal Medicine

## 2020-11-18 NOTE — Progress Notes (Signed)
ARNETHA, SILVERTHORNE ([id number]) Visit Report for 11/16/2020 HPI Details Patient Name: Date of Service: KELIA, GIBBON 11/16/2020 11:30 A M Medical Record Number: [id number] Patient Account Number: [id number] Date of Birth/Sex: Treating RN: 1941/08/15 (80 y.o. [name], [name] Primary Care Provider: PA Darnelle Spangle Other Clinician: Referring Provider: Treating Provider/Extender: Cheree Ditto in Treatment: 3 History of Present Illness HPI Description: 10/26/2020 upon evaluation today patient appears to be doing somewhat poorly in regard to wounds on her left heel and left lower leg. The leg is really not nearly as bad as the heel ulcer however. This is an unstageable pressure ulcer unfortunately that occurred when the patient was in the hospital secondary to a stroke. At this point the patient is seen with her daughter as well and both are able to answer and respond to questions. With that being said that the biggest issue right now is that the eschar on the heel is not really stable I think that this is going to need to be addressed with more specific and appropriate dressings currently. Apparently she did see a podiatrist more recently and they did perform some sharp debridement to clear away necrotic tissue here on the heel. T be honest the patient's daughter is not extremely pleased about that she states that they heal was completely dry and stable at that point. With that o being said obviously I did not see anything at that time and I cannot really speak to what the heel look like and then but right now it definitely appears to be much softer and I do not think just Betadine is good to do the job. I think we have to focus on trying to get the eschar off of the heel at this point. The patient is in a assisted living facility currently. With that being said the daughter also questions whether or not she is going require skilled nursing. She does have a history of diabetes mellitus type 2. She  also has a history of chronic venous insufficiency. 3/15; patient admitted to the clinic last week. She has an unstageable pressure ulcer on the left heel that apparently occurred at [facility] when she was recovering from the stroke. She [facility] time [facility] and now is in [place] assisted living. She has home health twice a week we have been using Iodoflex on this area. It was pointed out last week that she does not have palpable pulses below the femoral we ordered arterial studies but her daughter has not heard anything about this we will recheck this. 3/22; unstageable pressure ulcer on the left heel and an area on her left posterior calf. She had her this showed an ABI in the left of 1.15 however monophasic waveforms in the great toe TBI of 0.18 with a pressure of 39. Doppler analysis revealed monophasic waveforms at the CFA which by my way of thinking might suggest proximal disease she had a 50-74 stenosis of the distal SFA with a focal velocity elevation of 357 cm/s. We have been using Iodoflex to both wound areas. The patient does not really complain of a lot of pain but she is not that ambulatory. 3/29 still unstageable area with eschar on the tip of the left heel. This is gradually separating. We have a vascular consult on April 5 Electronic Signature(s) Signed: 11/18/2020 7:47:56 AM By: Linton Ham MD Entered By: Linton Ham on 11/16/2020 12:56:59 -------------------------------------------------------------------------------- Physical Exam Details Patient Name: Date of Service: Doyle Askew,  Eldridge Scot 11/16/2020 11:30 A M Medical Record Number: [id number] Patient Account Number: [id number] Date of Birth/Sex: Treating RN: Jan 09, 1941 (80 y.o. Benjaman Lobe Primary Care Provider: PA Darnelle Spangle Other Clinician: Referring Provider: Treating Provider/Extender: Cheree Ditto in Treatment: 3 Constitutional Patient is hypertensive.. Pulse  regular and within target range for patient.Marland Kitchen Respirations regular, non-labored and within target range.. Temperature is normal and within the target range for the patient.Marland Kitchen Appears in no distress. Cardiovascular Popliteal pulse also absent. Pedal pulses are absent on the left. Notes Wound exam; left heel still has the necrotic black eschar however it is separating. I wait till we have the final arterial word apparently from Dr. Guadlupe Spanish before proceeding with debridement. Electronic Signature(s) Signed: 11/18/2020 7:47:56 AM By: Linton Ham MD Entered By: Linton Ham on 11/16/2020 12:57:52 -------------------------------------------------------------------------------- Physician Orders Details Patient Name: Date of Service: [name] 11/16/2020 11:30 A M Medical Record Number: [id number] Patient Account Number: [id number] Date of Birth/Sex: Treating RN: 14-Mar-1941 (80 y.o. Benjaman Lobe Primary Care Provider: PA Haig Prophet, NO Other Clinician: Referring Provider: Treating Provider/Extender: Cheree Ditto in Treatment: 3 Verbal / Phone Orders: No Diagnosis Coding Follow-up Appointments ppointment in 1 week. - on Thursday or Friday Return A Bathing/ Shower/ Hygiene May shower with protection but do not get wound dressing(s) wet. Edema Control - Lymphedema / SCD / Other Elevate legs to the level of the heart or above for 30 minutes daily and/or when sitting, a frequency of: Avoid standing for long periods of time. Moisturize legs daily. Off-Loading Other: - offloading shoe for left heel when pt. is up and walking. Pt. may wear pressure-relieving boots at bedtime and also while she's in the wheelchair to reduce pressure on the L heel. Home Health No change in wound care orders this week; continue Home Health for wound care. May utilize formulary equivalent dressing for wound treatment orders unless otherwise specified. - Home health to change twice a  week Wound Treatment Wound #1 - Calcaneus Wound Laterality: Left Cleanser: Wound Cleanser (Home Health) Every Other Day/15 Days Discharge Instructions: Cleanse the wound with wound cleanser prior to applying a clean dressing using gauze sponges, not tissue or cotton balls. Prim Dressing: Iodosorb Gel 10 (gm) Tube [facility]) Every Other Day/15 Days ary Discharge Instructions: Apply to wound bed as instructed Secondary Dressing: Woven Gauze Sponge, Non-Sterile 4x4 in [facility]) Every Other Day/15 Days Discharge Instructions: Apply over primary dressing as directed. Secondary Dressing: ABD Pad, 5x9 [facility]) Every Other Day/15 Days Discharge Instructions: Apply over primary dressing as directed. Secured With: [employer], 4.5x3.1 (in/yd) [facility]) Every Other Day/15 Days Discharge Instructions: Secure with Kerlix as directed. Secured With: 37M Medipore H Soft Cloth Surgical T 4 x 2 (in/yd) (Home Health) Every Other Day/15 Days ape Discharge Instructions: Secure dressing with tape as directed. Wound #2 - Lower Leg Wound Laterality: Left, Medial Cleanser: Wound Cleanser (Home Health) Every Other Day/15 Days Discharge Instructions: Cleanse the wound with wound cleanser prior to applying a clean dressing using gauze sponges, not tissue or cotton balls. Prim Dressing: Iodosorb Gel 10 (gm) Tube [facility]) Every Other Day/15 Days ary Discharge Instructions: Apply to wound bed as instructed Secondary Dressing: Woven Gauze Sponge, Non-Sterile 4x4 in [facility]) Every Other Day/15 Days Discharge Instructions: Apply over primary dressing as directed. Secondary Dressing: ABD Pad, 5x9 [facility]) Every Other Day/15 Days Discharge Instructions: Apply over primary dressing as directed. Secured With: [employer], 4.5x3.1 (in/yd) (Home  Health) Every Other Day/15 Days Discharge Instructions: Secure with Kerlix as directed. Secured With: 57M Medipore H Soft Cloth Surgical T  4 x 2 (in/yd) (Home Health) Every Other Day/15 Days ape Discharge Instructions: Secure dressing with tape as directed. Electronic Signature(s) Signed: 11/16/2020 5:10:40 PM By: Rhae Hammock RN Signed: 11/18/2020 7:47:56 AM By: Linton Ham MD Entered By: Rhae Hammock on 11/16/2020 12:27:58 -------------------------------------------------------------------------------- Problem List Details Patient Name: Date of Service: [name] 11/16/2020 11:30 A M Medical Record Number: [id number] Patient Account Number: [id number] Date of Birth/Sex: Treating RN: 12/31/1940 (80 y.o. 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 [name], [name] Primary Care Provider: PA Darnelle Spangle Other Clinician: Referring Provider: Treating Provider/Extender: Cheree Ditto in Treatment: 3 Subjective History of Present Illness (HPI) 10/26/2020 upon evaluation today patient appears to be doing somewhat poorly in regard to wounds on her left heel and  left lower leg. The leg is really not nearly as bad as the heel ulcer however. This is an unstageable pressure ulcer unfortunately that occurred when the patient was in the hospital secondary to a stroke. At this point the patient is seen with her daughter as well and both are able to answer and respond to questions. With that being said that the biggest issue right now is that the eschar on the heel is not really stable I think that this is going to need to be addressed with more specific and appropriate dressings currently. Apparently she did see a podiatrist more recently and they did perform some sharp debridement to clear away necrotic tissue here on the heel. T be honest o the patient's daughter is not extremely pleased about that she states that they heal was completely dry and stable at that point. With that being said obviously I did not see anything at that time and I cannot really speak to what the heel look like and then but right now it definitely appears to be much softer and I do not think just Betadine is good to do the job. I think we have to focus on trying to get the eschar off of the heel at this point. The patient is in a assisted living facility currently. With that being said the daughter also questions whether or not she is going require skilled nursing. She does have a history of diabetes mellitus type 2. She also has a history of chronic venous insufficiency. 3/15; patient admitted to the clinic last week. She has an unstageable pressure ulcer on the left heel that apparently occurred at [facility] when she was recovering from the stroke. She spent some time at the rehab unit [facility] and now is in [place] assisted living. She has home health twice a week we have been using Iodoflex on  this area. It was pointed out last week that she does not have palpable pulses below the femoral we ordered arterial studies but her daughter has not heard anything about this we  will recheck this. 3/22; unstageable pressure ulcer on the left heel and an area on her left posterior calf. She had her this showed an ABI in the left of 1.15 however monophasic waveforms in the great toe TBI of 0.18 with a pressure of 39. Doppler analysis revealed monophasic waveforms at the CFA which by my way of thinking might suggest proximal disease she had a 50-74 stenosis of the distal SFA with a focal velocity elevation of 357 cm/s. We have been using Iodoflex to both wound areas. The patient does not really complain of a lot of pain but she is not that ambulatory. 3/29 still unstageable area with eschar on the tip of the left heel. This is gradually separating. We have a vascular consult on April 5 Objective Constitutional Patient is hypertensive.. Pulse regular and within target range for patient.Marland Kitchen Respirations regular, non-labored and within target range.. Temperature is normal and within the target range for the patient.Marland Kitchen Appears in no distress. Vitals Time Taken: 12:07 PM, Height: 62 in, Weight: 173 lbs, BMI: 31.6, Temperature: 97.9 F, Pulse: 65 bpm, Respiratory Rate: 18 breaths/min, Blood Pressure: 169/98 mmHg. Cardiovascular Popliteal pulse also absent. Pedal pulses are absent on the left. General Notes: Wound exam; left heel still has the necrotic black eschar however it is separating. I wait till we have the final arterial word apparently from Dr. Guadlupe Spanish before proceeding with debridement. Integumentary (Hair, Skin) Wound #1 status is Open. Original cause of wound was Pressure Injury. The date acquired was: 09/21/2020. The wound has been in treatment 3 weeks. The wound is located on the Left Calcaneus. The wound measures 1.7cm length x 2.6cm width x 0.1cm depth; 3.471cm^2 area and 0.347cm^3 volume. There is Fat Layer (Subcutaneous Tissue) exposed. There is no tunneling or undermining noted. There is a small amount of serous drainage noted. The wound margin is distinct with the  outline attached to the wound base. There is no granulation within the wound bed. There is a large (67-100%) amount of necrotic tissue within the wound bed including Eschar. Wound #2 status is Open. Original cause of wound was Pressure Injury. The date acquired was: 10/19/2020. The wound has been in treatment 3 weeks. The wound is located on the Left,Medial Lower Leg. The wound measures 0.3cm length x 0.6cm width x 0.1cm depth; 0.141cm^2 area and 0.014cm^3 volume. There is Fat Layer (Subcutaneous Tissue) exposed. There is no tunneling or undermining noted. There is a small amount of serosanguineous drainage noted. The wound margin is flat and intact. There is large (67-100%) red granulation within the wound bed. There is a small (1-33%) amount of necrotic tissue within the wound bed including Eschar. Assessment Active Problems ICD-10 Pressure ulcer of left heel, unstageable Venous insufficiency (chronic) (peripheral) Type 2 diabetes mellitus with foot ulcer Non-pressure chronic ulcer of other part of left lower leg with fat layer exposed Plan Follow-up Appointments: Return Appointment in 1 week. - on Thursday or Friday Bathing/ Shower/ Hygiene: May shower with protection but do not get wound dressing(s) wet. Edema Control - Lymphedema / SCD / Other: Elevate legs to the level of the heart or above for 30 minutes daily and/or when sitting, a frequency of: Avoid standing for long periods of time. Moisturize legs daily. Off-Loading: Other: - offloading shoe for left heel when pt. is  up and walking. Pt. may wear pressure-relieving boots at bedtime and also while she's in the wheelchair to reduce pressure on the L heel. Home Health: No change in wound care orders this week; continue Home Health for wound care. May utilize formulary equivalent dressing for wound treatment orders unless otherwise specified. - Home health to change twice a week WOUND #1: - Calcaneus Wound Laterality:  Left Cleanser: Wound Cleanser (Home Health) Every Other Day/15 Days Discharge Instructions: Cleanse the wound with wound cleanser prior to applying a clean dressing using gauze sponges, not tissue or cotton balls. Prim Dressing: Iodosorb Gel 10 (gm) Tube [facility]) Every Other Day/15 Days ary Discharge Instructions: Apply to wound bed as instructed Secondary Dressing: Woven Gauze Sponge, Non-Sterile 4x4 in [facility]) Every Other Day/15 Days Discharge Instructions: Apply over primary dressing as directed. Secondary Dressing: ABD Pad, 5x9 [facility]) Every Other Day/15 Days Discharge Instructions: Apply over primary dressing as directed. Secured With: [employer], 4.5x3.1 (in/yd) [facility]) Every Other Day/15 Days Discharge Instructions: Secure with Kerlix as directed. Secured With: 34M Medipore H Soft Cloth Surgical T 4 x 2 (in/yd) (Home Health) Every Other Day/15 Days ape Discharge Instructions: Secure dressing with tape as directed. WOUND #2: - Lower Leg Wound Laterality: Left, Medial Cleanser: Wound Cleanser (Home Health) Every Other Day/15 Days Discharge Instructions: Cleanse the wound with wound cleanser prior to applying a clean dressing using gauze sponges, not tissue or cotton balls. Prim Dressing: Iodosorb Gel 10 (gm) Tube [facility]) Every Other Day/15 Days ary Discharge Instructions: Apply to wound bed as instructed Secondary Dressing: Woven Gauze Sponge, Non-Sterile 4x4 in [facility]) Every Other Day/15 Days Discharge Instructions: Apply over primary dressing as directed. Secondary Dressing: ABD Pad, 5x9 [facility]) Every Other Day/15 Days Discharge Instructions: Apply over primary dressing as directed. Secured With: [employer], 4.5x3.1 (in/yd) [facility]) Every Other Day/15 Days Discharge Instructions: Secure with Kerlix as directed. Secured With: 34M Medipore H Soft Cloth Surgical T 4 x 2 (in/yd) (Home Health) Every Other Day/15  Days ape Discharge Instructions: Secure dressing with tape as directed. 1. The area of black eschar on the heel is gradually separating 2. We have vascular surgery consult next week on the fifth we will see her back after that and perhaps a more aggressive debridement at that point I talked to her about this Electronic Signature(s) Signed: 11/18/2020 7:47:56 AM By: Linton Ham MD Entered By: Linton Ham on 11/16/2020 12:59:19 -------------------------------------------------------------------------------- SuperBill Details Patient Name: Date of Service: [name] 11/16/2020 Medical Record Number: [id number] Patient Account Number: [id number] Date of Birth/Sex: Treating RN: 1941/06/11 (79 y.o. [name], [name] Primary Care Provider: PA TIENT, NO Other Clinician: Referring Provider: Treating Provider/Extender: Cheree Ditto in Treatment: 3 Diagnosis Coding ICD-10 Codes Code Description [phone] Pressure ulcer of left heel, unstageable I87.2 Venous insufficiency (chronic) (peripheral) E11.621 Type 2 diabetes mellitus with foot ulcer L97.822 Non-pressure chronic ulcer of other part of left lower leg with fat layer exposed Physician Procedures : CPT4 Code Description Modifier [id number] - WC PHYS LEVEL 3 - EST PT ICD-10 Diagnosis Description L89.620 Pressure ulcer of left heel, unstageable Quantity: 1 Electronic Signature(s) Signed: 11/18/2020 7:47:56 AM By: Linton Ham MD Entered By: Linton Ham on 11/16/2020 12:59:34

## 2020-11-18 NOTE — Progress Notes (Signed)
Dorothy Barker, Dorothy Barker ([id number]) Visit Report for 11/16/2020 Arrival Information Details Patient Name: Date of Service: Dorothy Barker, Dorothy Barker 11/16/2020 11:30 A M Medical Record Number: [id number] Patient Account Number: [id number] Date of Birth/Sex: Treating RN: November 27, 1940 (80 y.o. Dorothy Barker Primary Care Lynne Righi: PA [place]ig [place], [place] Other Clinician: Referring Wing Schoch: Treating Aston Lieske/Extender: Cheree Ditto in Treatment: 3 Visit Information History Since Last Visit Added or deleted any medications: No Patient Arrived: Wheel Chair Any new allergies or adverse reactions: No Arrival Time: 12:07 Had a fall or experienced change in No Transfer Assistance: Manual activities of daily living that may affect Patient Identification Verified: Yes risk of falls: Secondary Verification Process Completed: Yes Signs or symptoms of abuse/neglect since last visito No Patient Requires Transmission-Based Precautions: No Hospitalized since last visit: No Patient Has Alerts: No Implantable device outside of the clinic excluding No cellular tissue based products placed in the center since last visit: Has Dressing in Place as Prescribed: Yes Has Footwear/Offloading in Place as Prescribed: Yes Left: Wedge Shoe Pain Present Now: No Electronic Signature(s) Signed: 11/16/2020 5:12:57 PM By: Lorrin Jackson Entered By: Lorrin Jackson on 11/16/2020 12:07:43 -------------------------------------------------------------------------------- Encounter Discharge Information Details Patient Name: Date of Service: Dorothy Barker 11/16/2020 11:30 A M Medical Record Number: [id number] Patient Account Number: [id number] Date of Birth/Sex: Treating RN: 12-25-1940 (80 y.o. Dorothy Barker Primary Care Jamaica Inthavong: PA Haig [place], [place] Other Clinician: Referring Nihal Marzella: Treating Cadarius Nevares/Extender: Cheree Ditto in Treatment: 3 Encounter Discharge Information Items Discharge Condition: Stable Ambulatory Status:  Wheelchair Discharge Destination: [place] Orders Sent: Yes Transportation: Other Schedule Follow-up Appointment: Yes Clinical Summary of Care: Provided on 11/16/2020 Form Type Recipient Paper Patient Patient Electronic Signature(s) Signed: 11/16/2020 12:39:16 PM By: Lorrin Jackson Entered By: Lorrin Jackson on 11/16/2020 12:39:16 -------------------------------------------------------------------------------- Lower Extremity Assessment Details Patient Name: Date of Service: Dorothy Barker, Dorothy Barker 11/16/2020 11:30 A M Medical Record Number: [id number] Patient Account Number: [id number] Date of Birth/Sex: Treating RN: February 15, 1941 (80 y.o. Dorothy Barker Primary Care Toyoko Silos: PA [place]ig [place], [place] Other Clinician: Referring Raymont Andreoni: Treating Yanni Quiroa/Extender: Cheree Ditto in Treatment: 3 Edema Assessment Assessed: [Left: Yes] [Right: No] Edema: [Left: Ye] [Right: s] Calf Left: Right: Point of Measurement: From Medial Instep 35.2 cm Ankle Left: Right: Point of Measurement: From Medial Instep 21.7 cm Vascular Assessment Pulses: Dorsalis Pedis Palpable: [Left:Yes] Electronic Signature(s) Signed: 11/16/2020 5:12:57 PM By: Lorrin Jackson Entered By: Lorrin Jackson on 11/16/2020 12:08:29 -------------------------------------------------------------------------------- Multi Wound Chart Details Patient Name: Date of Service: Dorothy Barker 11/16/2020 11:30 A M Medical Record Number: [id number] Patient Account Number: [id number] Date of Birth/Sex: Treating RN: 26-Jul-1941 (80 y.o. Dorothy Barker, Dorothy Barker Primary Care Lonnell Chaput: PA [place], [place] Other Clinician: Referring Chester Romero: Treating Imara Standiford/Extender: Cheree Ditto in Treatment: 3 Vital Signs Height(in): 51 Pulse(bpm): 74 Weight(lbs): 173 Blood Pressure(mmHg): 169/98 Body Mass Index(BMI): 32 Temperature(F): 97.9 Respiratory Rate(breaths/min): 18 Photos: [1:No Photos Left Calcaneus] [2:No Photos Left,  Medial Lower Leg] [N/A:N/A N/A] Wound Location: [1:Pressure Injury] [2:Pressure Injury] [N/A:N/A] Wounding Event: [1:Pressure Ulcer] [2:Pressure Ulcer] [N/A:N/A] Primary Etiology: [1:Diabetic Wound/Ulcer of the Lower] [2:N/A] [N/A:N/A] Secondary Etiology: [1:Extremity Cataracts, Hypertension, Type II] [2:Cataracts, Hypertension, Type II] [N/A:N/A] Comorbid History: [1:Diabetes, Osteoarthritis, Dementia 09/21/2020] [2:Diabetes, Osteoarthritis, Dementia 10/19/2020] [N/A:N/A] Date Acquired: [1:3] [2:3] [N/A:N/A] Weeks of Treatment: [1:Open] [2:Open] [N/A:N/A] Wound Status: [1:1.7x2.6x0.1] [2:0.3x0.6x0.1] [N/A:N/A] Measurements L x W x D (cm) [1:3.471] [2:0.141] [N/A:N/A] A (cm) : rea [1:0.347] [2:0.014] [N/A:N/A] Volume (cm) : [1:12.30%] [2:40.30%] [N/A:N/A] % Reduction in A rea: [1:12.40%] [2:41.70%] [N/A:N/A] %  Reduction in Volume: [1:Unstageable/Unclassified] [2:Unstageable/Unclassified] [N/A:N/A] Classification: [1:Small] [2:Small] [N/A:N/A] Exudate A mount: [1:Serous] [2:Serosanguineous] [N/A:N/A] Exudate Type: [1:amber] [2:red, brown] [N/A:N/A] Exudate Color: [1:Distinct, outline attached] [2:Flat and Intact] [N/A:N/A] Wound Margin: [1:None Present (0%)] [2:Large (67-100%)] [N/A:N/A] Granulation A mount: [1:N/A] [2:Red] [N/A:N/A] Granulation Quality: [1:Large (67-100%)] [2:Small (1-33%)] [N/A:N/A] Necrotic A mount: [1:Eschar] [2:Eschar] [N/A:N/A] Necrotic Tissue: [1:Fat Layer (Subcutaneous Tissue): Yes Fat Layer (Subcutaneous Tissue): Yes N/A] Exposed Structures: [1:Fascia: No Tendon: No Muscle: No Joint: No Bone: No Small (1-33%)] [2:Fascia: No Tendon: No Muscle: No Joint: No Bone: No Small (1-33%)] [N/A:N/A] Treatment Notes Wound #1 (Calcaneus) Wound Laterality: Left Cleanser Wound Cleanser Discharge Instruction: Cleanse the wound with wound cleanser prior to applying a clean dressing using gauze sponges, not tissue or cotton balls. Peri-Wound Care Topical Primary  Dressing Iodosorb Gel 10 (gm) Tube Discharge Instruction: Apply to wound bed as instructed Secondary Dressing Woven Gauze Sponge, Non-Sterile 4x4 in Discharge Instruction: Apply over primary dressing as directed. ABD Pad, 5x9 Discharge Instruction: Apply over primary dressing as directed. Secured With [employer], 4.5x3.1 (in/yd) Discharge Instruction: Secure with Kerlix as directed. 68M Medipore H Soft Cloth Surgical T 4 x 2 (in/yd) ape Discharge Instruction: Secure dressing with tape as directed. Compression Wrap Compression Stockings Add-Ons Wound #2 (Lower Leg) Wound Laterality: Left, Medial Cleanser Wound Cleanser Discharge Instruction: Cleanse the wound with wound cleanser prior to applying a clean dressing using gauze sponges, not tissue or cotton balls. Peri-Wound Care Topical Primary Dressing Iodosorb Gel 10 (gm) Tube Discharge Instruction: Apply to wound bed as instructed Secondary Dressing Woven Gauze Sponge, Non-Sterile 4x4 in Discharge Instruction: Apply over primary dressing as directed. ABD Pad, 5x9 Discharge Instruction: Apply over primary dressing as directed. Secured With [employer], 4.5x3.1 (in/yd) Discharge Instruction: Secure with Kerlix as directed. 68M Medipore H Soft Cloth Surgical T 4 x 2 (in/yd) ape Discharge Instruction: Secure dressing with tape as directed. Compression Wrap Compression Stockings Add-Ons Electronic Signature(s) Signed: 11/16/2020 5:10:40 PM By: Rhae Hammock RN Signed: 11/18/2020 7:47:56 AM By: Linton Ham MD Entered By: Linton Ham on 11/16/2020 12:55:52 -------------------------------------------------------------------------------- Multi-Disciplinary Care Plan Details Patient Name: Date of Service: Dorothy Barker 11/16/2020 11:30 A M Medical Record Number: [id number] Patient Account Number: [id number] Date of Birth/Sex: Treating RN: 08/23/1940 (80 y.o. Dorothy Barker, Dorothy Barker Primary Care  Baby Gieger: PA [place], [place] Other Clinician: Referring Cherl Gorney: Treating Camaya Gannett/Extender: Cheree Ditto in Treatment: 3 Active Inactive Orientation to the Wound Care Program Nursing Diagnoses: Knowledge deficit related to the wound healing center program Goals: Patient/caregiver will verbalize understanding of the [place] Date Initiated: 10/26/2020 Target Resolution Date: 11/25/2020 Goal Status: Active Interventions: Provide education on orientation to the wound center Notes: Wound/Skin Impairment Nursing Diagnoses: Impaired tissue integrity Knowledge deficit related to ulceration/compromised skin integrity Goals: Patient will have a decrease in wound volume by X% from date: (specify in notes) Date Initiated: 10/26/2020 Target Resolution Date: 11/25/2020 Goal Status: Active Patient/caregiver will verbalize understanding of skin care regimen Date Initiated: 10/26/2020 Target Resolution Date: 11/25/2020 Goal Status: Active Ulcer/skin breakdown will have a volume reduction of 30% by week 4 Date Initiated: 10/26/2020 Target Resolution Date: 11/22/2020 Goal Status: Active Interventions: Assess patient/caregiver ability to obtain necessary supplies Assess patient/caregiver ability to perform ulcer/skin care regimen upon admission and as needed Assess ulceration(s) every visit Notes: Electronic Signature(s) Signed: 11/16/2020 5:10:40 PM By: Rhae Hammock RN Entered By: Rhae Hammock on 11/16/2020 12:28:17 -------------------------------------------------------------------------------- Pain Assessment Details Patient Name: Date of Service: Dorothy Barker.  11/16/2020 11:30 A M Medical Record Number: [id number] Patient Account Number: [id number] Date of Birth/Sex: Treating RN: 06/26/1941 (80 y.o. Dorothy Barker Primary Care Megan Presti: PA [place]ig [place], [place] Other Clinician: Referring Charlize Hathaway: Treating Romolo Sieling/Extender: Cheree Ditto in Treatment:  3 Active Problems Location of Pain Severity and Description of Pain Patient Has Paino No Site Locations Pain Management and Medication Current Pain Management: Electronic Signature(s) Signed: 11/16/2020 5:12:57 PM By: Lorrin Jackson Entered By: Lorrin Jackson on 11/16/2020 12:08:13 -------------------------------------------------------------------------------- Patient/Caregiver Education Details Patient Name: Date of Service: Dorothy Barker 3/29/2022andnbsp11:30 A M Medical Record Number: [id number] Patient Account Number: [id number] Date of Birth/Gender: Treating RN: 04-Jul-1941 (80 y.o. Benjaman Lobe Primary Care Physician: PA [place]ig [place], [place] Other Clinician: Referring Physician: Treating Physician/Extender: Cheree Ditto in Treatment: 3 Education Assessment Education Provided To: Patient Education Topics Provided Welcome T [facility] [place]: o Methods: Explain/Verbal Responses: State content correctly Wound/Skin Impairment: Methods: Explain/Verbal Responses: State content correctly Electronic Signature(s) Signed: 11/16/2020 5:10:40 PM By: Rhae Hammock RN Entered By: Rhae Hammock on 11/16/2020 12:28:34 -------------------------------------------------------------------------------- Wound Assessment Details Patient Name: Date of Service: Dorothy Barker 11/16/2020 11:30 A M Medical Record Number: [id number] Patient Account Number: [id number] Date of Birth/Sex: Treating RN: 08/10/1941 (80 y.o. Dorothy Barker Primary Care Akansha Wyche: PA [place], [place] Other Clinician: Referring Juluis Fitzsimmons: Treating Sophia Cubero/Extender: Cheree Ditto in Treatment: 3 Wound Status Wound Number: 1 Primary Etiology: Pressure Ulcer Wound Location: Left Calcaneus Secondary Diabetic Wound/Ulcer of the Lower Extremity Etiology: Wounding Event: Pressure Injury Wound Status: Open Date Acquired: 09/21/2020 Comorbid Cataracts, Hypertension, Type II Diabetes,  Osteoarthritis, Weeks Of Treatment: 3 History: Dementia Clustered Wound: No Photos Wound Measurements Length: (cm) 1.7 Width: (cm) 2.6 Depth: (cm) 0.1 Area: (cm) 3.471 Volume: (cm) 0.347 % Reduction in Area: 12.3% % Reduction in Volume: 12.4% Epithelialization: Small (1-33%) Tunneling: No Undermining: No Wound Description Classification: Unstageable/Unclassified Wound Margin: Distinct, outline attached Exudate Amount: Small Exudate Type: Serous Exudate Color: amber Foul Odor After Cleansing: No Slough/Fibrino No Wound Bed Granulation Amount: None Present (0%) Exposed Structure Necrotic Amount: Large (67-100%) Fascia Exposed: No Necrotic Quality: Eschar Fat Layer (Subcutaneous Tissue) Exposed: Yes Tendon Exposed: No Muscle Exposed: No Joint Exposed: No Bone Exposed: No Treatment Notes Wound #1 (Calcaneus) Wound Laterality: Left Cleanser Wound Cleanser Discharge Instruction: Cleanse the wound with wound cleanser prior to applying a clean dressing using gauze sponges, not tissue or cotton balls. Peri-Wound Care Topical Primary Dressing Iodosorb Gel 10 (gm) Tube Discharge Instruction: Apply to wound bed as instructed Secondary Dressing Woven Gauze Sponge, Non-Sterile 4x4 in Discharge Instruction: Apply over primary dressing as directed. ABD Pad, 5x9 Discharge Instruction: Apply over primary dressing as directed. Secured With [employer], 4.5x3.1 (in/yd) Discharge Instruction: Secure with Kerlix as directed. 46M Medipore H Soft Cloth Surgical T 4 x 2 (in/yd) ape Discharge Instruction: Secure dressing with tape as directed. Compression Wrap Compression Stockings Add-Ons Electronic Signature(s) Signed: 11/16/2020 5:12:57 PM By: Lorrin Jackson Signed: 11/17/2020 9:05:34 AM By: Sandre Kitty Entered By: Sandre Kitty on 11/16/2020 17:01:29 -------------------------------------------------------------------------------- Wound Assessment  Details Patient Name: Date of Service: Dorothy Barker 11/16/2020 11:30 A M Medical Record Number: [id number] Patient Account Number: [id number] Date of Birth/Sex: Treating RN: [birth year] (80 y.o. Dorothy Barker Primary Care Janayah Zavada: PA [place], [place] Other Clinician: Referring Kerrie Timm: Treating Dashauna Heymann/Extender: Cheree Ditto in Treatment: 3 Wound Status Wound Number: 2 Primary Pressure Ulcer Etiology: Wound Location: Left, Medial Lower Leg Wound Status: Open Wounding Event: Pressure Injury Comorbid Cataracts, Hypertension,  Type II Diabetes, Osteoarthritis, Date Acquired: 10/19/2020 History: Dementia Weeks Of Treatment: 3 Clustered Wound: No Photos Wound Measurements Length: (cm) 0.3 Width: (cm) 0.6 Depth: (cm) 0.1 Area: (cm) 0.141 Volume: (cm) 0.014 % Reduction in Area: 40.3% % Reduction in Volume: 41.7% Epithelialization: Small (1-33%) Tunneling: No Undermining: No Wound Description Classification: Unstageable/Unclassified Wound Margin: Flat and Intact Exudate Amount: Small Exudate Type: Serosanguineous Exudate Color: red, brown Foul Odor After Cleansing: No Slough/Fibrino No Wound Bed Granulation Amount: Large (67-100%) Exposed Structure Granulation Quality: Red Fascia Exposed: No Necrotic Amount: Small (1-33%) Fat Layer (Subcutaneous Tissue) Exposed: Yes Necrotic Quality: Eschar Tendon Exposed: No Muscle Exposed: No Joint Exposed: No Bone Exposed: No Treatment Notes Wound #2 (Lower Leg) Wound Laterality: Left, Medial Cleanser Wound Cleanser Discharge Instruction: Cleanse the wound with wound cleanser prior to applying a clean dressing using gauze sponges, not tissue or cotton balls. Peri-Wound Care Topical Primary Dressing Iodosorb Gel 10 (gm) Tube Discharge Instruction: Apply to wound bed as instructed Secondary Dressing Woven Gauze Sponge, Non-Sterile 4x4 in Discharge Instruction: Apply over primary dressing as directed. ABD Pad,  5x9 Discharge Instruction: Apply over primary dressing as directed. Secured With [employer], 4.5x3.1 (in/yd) Discharge Instruction: Secure with Kerlix as directed. 8M Medipore H Soft Cloth Surgical T 4 x 2 (in/yd) ape Discharge Instruction: Secure dressing with tape as directed. Compression Wrap Compression Stockings Add-Ons Electronic Signature(s) Signed: 11/16/2020 5:12:57 PM By: Lorrin Jackson Signed: 11/17/2020 9:05:34 AM By: Sandre Kitty Entered By: Sandre Kitty on 11/16/2020 17:01:00 -------------------------------------------------------------------------------- Vitals Details Patient Name: Date of Service: Dorothy Barker 11/16/2020 11:30 A M Medical Record Number: [id number] Patient Account Number: [id number] Date of Birth/Sex: Treating RN: 27-Mar-1941 (80 y.o. Dorothy Barker Primary Care Makiah Clauson: PA [place]ig [place], [place] Other Clinician: Referring Davius Goudeau: Treating Kalai Baca/Extender: Cheree Ditto in Treatment: 3 Vital Signs Time Taken: 12:07 Temperature (F): 97.9 Height (in): 62 Pulse (bpm): 65 Weight (lbs): 173 Respiratory Rate (breaths/min): 18 Body Mass Index (BMI): 31.6 Blood Pressure (mmHg): 169/98 Reference Range: 80 - 120 mg / dl Electronic Signature(s) Signed: 11/16/2020 5:12:57 PM By: Lorrin Jackson Entered By: Lorrin Jackson on 11/16/2020 12:08:04

## 2020-11-23 ENCOUNTER — Other Ambulatory Visit: Payer: Self-pay

## 2020-11-23 ENCOUNTER — Ambulatory Visit (INDEPENDENT_AMBULATORY_CARE_PROVIDER_SITE_OTHER): Payer: Medicare PPO | Admitting: Vascular Surgery

## 2020-11-23 ENCOUNTER — Encounter: Payer: Medicare PPO | Admitting: Vascular Surgery

## 2020-11-23 ENCOUNTER — Encounter: Payer: Self-pay | Admitting: Vascular Surgery

## 2020-11-23 VITALS — BP 142/70 | HR 74 | Temp 97.9°F | Resp 20 | Ht 60.0 in | Wt 173.0 lb

## 2020-11-23 DIAGNOSIS — I70244 Atherosclerosis of native arteries of left leg with ulceration of heel and midfoot: Secondary | ICD-10-CM | POA: Diagnosis not present

## 2020-11-23 NOTE — H&P (View-Only) (Signed)
[facility] [place]  ASSESSMENT / PLAN: Dorothy Barker is a 80 y.o. female with atherosclerosis of native arteries of left lower extremity causing ulceration.  Patient counseled patients with chronic limb threatening ischemia have an annual risk of cardiovascular mortality of 25% and a annual amputation risk of 25%. Aggressive risk factor modification and intervention for limb salvage is indicated..  Recommend the following which can slow the progression of atherosclerosis and reduce the risk of major adverse cardiac / limb events:  Complete cessation from all tobacco products. Blood glucose control with goal A1c < 7%. Blood pressure control with goal blood pressure < 140/90 mmHg. Lipid reduction therapy with goal LDL-C <100 mg/dL (<70 if symptomatic from PAD).  Aspirin 81mg  PO QD.  Atorvastatin 40-80mg  PO QD (or other "high intensity" statin therapy).  Plan left lower extremity angiogram with possible intervention via right common femoral artery access approach in cath lab 12/01/20. Will plan to use CO2 and limited contrast to avoid risk to solitary kidney and CKD (GFR 42).   CHIEF COMPLAINT: left heel ulceration  HISTORY OF PRESENT ILLNESS: Dorothy Barker is a 80 y.o. female who presents from a skilled nursing facility for evaluation of a left posterior heel ulcer.  She says this developed in the hospital while she was recovering from a stroke.  The ulcer has not improved.  Before her stroke, the patient lived independently and was ambulatory.  She now gets around in a wheelchair.  She denies any ischemic rest pain.  She did not have claudication prior to her stroke.  Past Medical History:  Diagnosis Date  . Cancer ([place])    kidney  . Chronic kidney disease   . Diabetes mellitus without complication ([place])   . Hyperlipidemia   . Hypertension   . Stroke ([place])   . Thyroid disease   . Vitamin D deficiency     Past Surgical History:  Procedure Laterality Date  .  ABDOMINAL HYSTERECTOMY    . KNEE SURGERY Right   . NEPHRECTOMY Left   . TONSILLECTOMY      History reviewed. No pertinent family history.  Social History   Socioeconomic History  . Marital status: Unknown    Spouse name: Not on file  . Number of children: Not on file  . Years of education: Not on file  . Highest education level: Not on file  Occupational History  . Not on file  Tobacco Use  . Smoking status: Passive Smoke Exposure - Never Smoker  . Smokeless tobacco: Never Used  Vaping Use  . Vaping Use: Never used  Substance and Sexual Activity  . Alcohol use: Never  . Drug use: Never  . Sexual activity: Not on file  Other Topics Concern  . Not on file  Social History Narrative  . Not on file   Social Determinants of Health   Financial Resource Strain: Not on file  Food Insecurity: Not on file  Transportation Needs: Not on file  Physical Activity: Not on file  Stress: Not on file  Social Connections: Not on file  Intimate Partner Violence: Not on file    Allergies  Allergen Reactions  . Tramadol     Other reaction(s): Other (See Comments) Syncopal episodes   . Amlodipine Besy-Benazepril Hcl Rash  . Codeine Rash  . Naproxen Rash    Current Outpatient Medications  Medication Sig Dispense Refill  . acetaminophen (TYLENOL) 500 MG tablet Take 500 mg by mouth every 8 (eight) hours as  needed.    Francia Greaves THYROID 60 MG tablet Take 60 mg by mouth daily.    Marland Kitchen aspirin 81 MG EC tablet Take 1 tablet by mouth daily.    Marland Kitchen atorvastatin (LIPITOR) 40 MG tablet Take 1 tablet by mouth daily.    . carvedilol (COREG) 6.25 MG tablet Take by mouth.    . Dulaglutide (TRULICITY) 0.10 UV/2.5DG SOPN Inject 0.5 mLs into the skin once a week.    . ergocalciferol (VITAMIN D2) 1.25 MG (50000 UT) capsule Take 1 capsule by mouth once a week.    . Insulin Aspart FlexPen 100 UNIT/ML SOPN Inject into the skin.    Marland Kitchen LANTUS SOLOSTAR 100 UNIT/ML Solostar Pen Inject into the skin.    Marland Kitchen  lisinopril-hydrochlorothiazide (ZESTORETIC) 20-12.5 MG tablet Take 2 tablets by mouth daily.     No current facility-administered medications for this visit.    REVIEW OF SYSTEMS:  [X]  denotes positive finding, [ ]  denotes negative finding Cardiac  Comments:  Chest pain or chest pressure:    Shortness of breath upon exertion:    Short of breath when lying flat:    Irregular heart rhythm:        Vascular    Pain in calf, thigh, or hip brought on by ambulation:    Pain in feet at night that wakes you up from your sleep:     Blood clot in your veins:    Leg swelling:         Pulmonary    Oxygen at home:    Productive cough:     Wheezing:         Neurologic    Sudden weakness in arms or legs:     Sudden numbness in arms or legs:     Sudden onset of difficulty speaking or slurred speech:    Temporary loss of vision in one eye:     Problems with dizziness:         Gastrointestinal    Blood in stool:     Vomited blood:         Genitourinary    Burning when urinating:     Blood in urine:        Psychiatric    Major depression:         Hematologic    Bleeding problems:    Problems with blood clotting too easily:        Skin    Rashes or ulcers:        Constitutional    Fever or chills:      PHYSICAL EXAM  Vitals:   11/23/20 1517  BP: (!) 142/70  Pulse: 74  Resp: 20  Temp: 97.9 F (36.6 C)  SpO2: 100%  Weight: 78.5 kg  Height: 5' (1.524 m)    Constitutional: elderly. No distress. Appears well nourished.  Neurologic: CN intact. no focal findings. no sensory loss. Psychiatric: Mood and affect symmetric and appropriate. Eyes: No icterus. No conjunctival pallor. Ears, nose, throat: mucous membranes moist. Midline trachea.  Cardiac: regular rate and rhythm.  Respiratory: unlabored. Abdominal: soft, non-tender, non-distended.  Peripheral vascular:  No palpable pedal pulses  Unstageable left heel decubitus ulcer Extremity: No edema. No cyanosis. No pallor.   Skin: No gangrene. No ulceration.  Lymphatic: No Stemmer's sign. No palpable lymphadenopathy.  PERTINENT LABORATORY AND RADIOLOGIC DATA LOWER EXTREMITY ARTERIAL DUPLEX STUDY   Indications: Ulceration.    Current ABI: Bilateral [place] Right TBI 0.36 Left TBI 0.18   Comparison  Study: none   Performing Technologist: Ralene Cork RVT     Examination Guidelines: A complete evaluation includes B-mode imaging,  spectral  Doppler, color Doppler, and power Doppler as needed of all accessible  portions  of each vessel. Bilateral testing is considered an integral part of a  complete  examination. Limited examinations for reoccurring indications may be  performed  as noted.          +----------+--------+-----+---------------+----------+--------+  LEFT   PSV cm/sRatioStenosis    Waveform Comments  +----------+--------+-----+---------------+----------+--------+  CFA Distal62              monophasic      +----------+--------+-----+---------------+----------+--------+  DFA    78              biphasic       +----------+--------+-----+---------------+----------+--------+  SFA Prox 68              monophasic      +----------+--------+-----+---------------+----------+--------+  SFA Mid  61              monophasic      +----------+--------+-----+---------------+----------+--------+  SFA Distal357      50-74% stenosismonophasic      +----------+--------+-----+---------------+----------+--------+  POP Prox 40              monophasic      +----------+--------+-----+---------------+----------+--------+  POP Distal37              monophasic      +----------+--------+-----+---------------+----------+--------+   A focal velocity elevation of 357 cm/s was obtained at distal SFA with  post stenotic turbulence with a VR of  5.9. Findings are characteristic of  50-74% stenosis.       Summary:  Left: 50-74% distal SFA stenosis by velocity and 75-99% by ratio.  Monophasic inflow indicates proximal disease.     See table(s) above for measurements and observations.      Electronically signed by Servando Snare MD on 11/05/2020 at 10:03:05 AM.       Yevonne Aline. Stanford Breed, MD [facility] Phone Number: [phone] 11/23/2020 4:33 PM

## 2020-11-23 NOTE — Progress Notes (Signed)
[facility] [place]  ASSESSMENT / PLAN: Dorothy Barker is a 80 y.o. female with atherosclerosis of native arteries of left lower extremity causing ulceration.  Patient counseled patients with chronic limb threatening ischemia have an annual risk of cardiovascular mortality of 25% and a annual amputation risk of 25%. Aggressive risk factor modification and intervention for limb salvage is indicated..  Recommend the following which can slow the progression of atherosclerosis and reduce the risk of major adverse cardiac / limb events:  Complete cessation from all tobacco products. Blood glucose control with goal A1c < 7%. Blood pressure control with goal blood pressure < 140/90 mmHg. Lipid reduction therapy with goal LDL-C <100 mg/dL (<70 if symptomatic from PAD).  Aspirin 81mg  PO QD.  Atorvastatin 40-80mg  PO QD (or other "high intensity" statin therapy).  Plan left lower extremity angiogram with possible intervention via right common femoral artery access approach in cath lab 12/01/20. Will plan to use CO2 and limited contrast to avoid risk to solitary kidney and CKD (GFR 42).   CHIEF COMPLAINT: left heel ulceration  HISTORY OF PRESENT ILLNESS: Dorothy Barker is a 80 y.o. female who presents from a skilled nursing facility for evaluation of a left posterior heel ulcer.  She says this developed in the hospital while she was recovering from a stroke.  The ulcer has not improved.  Before her stroke, the patient lived independently and was ambulatory.  She now gets around in a wheelchair.  She denies any ischemic rest pain.  She did not have claudication prior to her stroke.  Past Medical History:  Diagnosis Date  . Cancer ([place])    kidney  . Chronic kidney disease   . Diabetes mellitus without complication ([place])   . Hyperlipidemia   . Hypertension   . Stroke ([place])   . Thyroid disease   . Vitamin D deficiency     Past Surgical History:  Procedure Laterality Date  .  ABDOMINAL HYSTERECTOMY    . KNEE SURGERY Right   . NEPHRECTOMY Left   . TONSILLECTOMY      History reviewed. No pertinent family history.  Social History   Socioeconomic History  . Marital status: Unknown    Spouse name: Not on file  . Number of children: Not on file  . Years of education: Not on file  . Highest education level: Not on file  Occupational History  . Not on file  Tobacco Use  . Smoking status: Passive Smoke Exposure - Never Smoker  . Smokeless tobacco: Never Used  Vaping Use  . Vaping Use: Never used  Substance and Sexual Activity  . Alcohol use: Never  . Drug use: Never  . Sexual activity: Not on file  Other Topics Concern  . Not on file  Social History Narrative  . Not on file   Social Determinants of Health   Financial Resource Strain: Not on file  Food Insecurity: Not on file  Transportation Needs: Not on file  Physical Activity: Not on file  Stress: Not on file  Social Connections: Not on file  Intimate Partner Violence: Not on file    Allergies  Allergen Reactions  . Tramadol     Other reaction(s): Other (See Comments) Syncopal episodes   . Amlodipine Besy-Benazepril Hcl Rash  . Codeine Rash  . Naproxen Rash    Current Outpatient Medications  Medication Sig Dispense Refill  . acetaminophen (TYLENOL) 500 MG tablet Take 500 mg by mouth every 8 (eight) hours as  needed.    Francia Greaves THYROID 60 MG tablet Take 60 mg by mouth daily.    Marland Kitchen aspirin 81 MG EC tablet Take 1 tablet by mouth daily.    Marland Kitchen atorvastatin (LIPITOR) 40 MG tablet Take 1 tablet by mouth daily.    . carvedilol (COREG) 6.25 MG tablet Take by mouth.    . Dulaglutide (TRULICITY) 8.41 LK/4.4WN SOPN Inject 0.5 mLs into the skin once a week.    . ergocalciferol (VITAMIN D2) 1.25 MG (50000 UT) capsule Take 1 capsule by mouth once a week.    . Insulin Aspart FlexPen 100 UNIT/ML SOPN Inject into the skin.    Marland Kitchen LANTUS SOLOSTAR 100 UNIT/ML Solostar Pen Inject into the skin.    Marland Kitchen  lisinopril-hydrochlorothiazide (ZESTORETIC) 20-12.5 MG tablet Take 2 tablets by mouth daily.     No current facility-administered medications for this visit.    REVIEW OF SYSTEMS:  [X]  denotes positive finding, [ ]  denotes negative finding Cardiac  Comments:  Chest pain or chest pressure:    Shortness of breath upon exertion:    Short of breath when lying flat:    Irregular heart rhythm:        Vascular    Pain in calf, thigh, or hip brought on by ambulation:    Pain in feet at night that wakes you up from your sleep:     Blood clot in your veins:    Leg swelling:         Pulmonary    Oxygen at home:    Productive cough:     Wheezing:         Neurologic    Sudden weakness in arms or legs:     Sudden numbness in arms or legs:     Sudden onset of difficulty speaking or slurred speech:    Temporary loss of vision in one eye:     Problems with dizziness:         Gastrointestinal    Blood in stool:     Vomited blood:         Genitourinary    Burning when urinating:     Blood in urine:        Psychiatric    Major depression:         Hematologic    Bleeding problems:    Problems with blood clotting too easily:        Skin    Rashes or ulcers:        Constitutional    Fever or chills:      PHYSICAL EXAM  Vitals:   11/23/20 1517  BP: (!) 142/70  Pulse: 74  Resp: 20  Temp: 97.9 F (36.6 C)  SpO2: 100%  Weight: 78.5 kg  Height: 5' (1.524 m)    Constitutional: elderly. No distress. Appears well nourished.  Neurologic: CN intact. no focal findings. no sensory loss. Psychiatric: Mood and affect symmetric and appropriate. Eyes: No icterus. No conjunctival pallor. Ears, nose, throat: mucous membranes moist. Midline trachea.  Cardiac: regular rate and rhythm.  Respiratory: unlabored. Abdominal: soft, non-tender, non-distended.  Peripheral vascular:  No palpable pedal pulses  Unstageable left heel decubitus ulcer Extremity: No edema. No cyanosis. No pallor.   Skin: No gangrene. No ulceration.  Lymphatic: No Stemmer's sign. No palpable lymphadenopathy.  PERTINENT LABORATORY AND RADIOLOGIC DATA LOWER EXTREMITY ARTERIAL DUPLEX STUDY   Indications: Ulceration.    Current ABI: Bilateral [place] Right TBI 0.36 Left TBI 0.18   Comparison  Study: none   Performing Technologist: Ralene Cork RVT     Examination Guidelines: A complete evaluation includes B-mode imaging,  spectral  Doppler, color Doppler, and power Doppler as needed of all accessible  portions  of each vessel. Bilateral testing is considered an integral part of a  complete  examination. Limited examinations for reoccurring indications may be  performed  as noted.          +----------+--------+-----+---------------+----------+--------+  LEFT   PSV cm/sRatioStenosis    Waveform Comments  +----------+--------+-----+---------------+----------+--------+  CFA Distal62              monophasic      +----------+--------+-----+---------------+----------+--------+  DFA    78              biphasic       +----------+--------+-----+---------------+----------+--------+  SFA Prox 68              monophasic      +----------+--------+-----+---------------+----------+--------+  SFA Mid  61              monophasic      +----------+--------+-----+---------------+----------+--------+  SFA Distal357      50-74% stenosismonophasic      +----------+--------+-----+---------------+----------+--------+  POP Prox 40              monophasic      +----------+--------+-----+---------------+----------+--------+  POP Distal37              monophasic      +----------+--------+-----+---------------+----------+--------+   A focal velocity elevation of 357 cm/s was obtained at distal SFA with  post stenotic turbulence with a VR of  5.9. Findings are characteristic of  50-74% stenosis.       Summary:  Left: 50-74% distal SFA stenosis by velocity and 75-99% by ratio.  Monophasic inflow indicates proximal disease.     See table(s) above for measurements and observations.      Electronically signed by Servando Snare MD on 11/05/2020 at 10:03:05 AM.       Yevonne Aline. Stanford Breed, MD [facility] Phone Number: [phone] 11/23/2020 4:33 PM

## 2020-11-25 ENCOUNTER — Encounter (HOSPITAL_BASED_OUTPATIENT_CLINIC_OR_DEPARTMENT_OTHER): Payer: Medicare PPO | Admitting: Internal Medicine

## 2020-11-27 ENCOUNTER — Emergency Department (HOSPITAL_COMMUNITY)
Admission: EM | Admit: 2020-11-27 | Discharge: 2020-11-27 | Disposition: A | Discharge status: Home / Self Care | Payer: Medicare PPO | Attending: Emergency Medicine | Admitting: Emergency Medicine

## 2020-11-27 ENCOUNTER — Encounter (HOSPITAL_COMMUNITY): Payer: Self-pay | Admitting: Emergency Medicine

## 2020-11-27 ENCOUNTER — Emergency Department (HOSPITAL_COMMUNITY): Payer: Medicare PPO

## 2020-11-27 ENCOUNTER — Other Ambulatory Visit: Payer: Self-pay

## 2020-11-27 DIAGNOSIS — R202 Paresthesia of skin: Secondary | ICD-10-CM

## 2020-11-27 DIAGNOSIS — Z7982 Long term (current) use of aspirin: Secondary | ICD-10-CM | POA: Insufficient documentation

## 2020-11-27 DIAGNOSIS — Z7722 Contact with and (suspected) exposure to environmental tobacco smoke (acute) (chronic): Secondary | ICD-10-CM | POA: Diagnosis not present

## 2020-11-27 DIAGNOSIS — Z79899 Other long term (current) drug therapy: Secondary | ICD-10-CM | POA: Insufficient documentation

## 2020-11-27 DIAGNOSIS — E119 Type 2 diabetes mellitus without complications: Secondary | ICD-10-CM | POA: Diagnosis not present

## 2020-11-27 DIAGNOSIS — L89629 Pressure ulcer of left heel, unspecified stage: Secondary | ICD-10-CM | POA: Insufficient documentation

## 2020-11-27 DIAGNOSIS — N189 Chronic kidney disease, unspecified: Secondary | ICD-10-CM | POA: Diagnosis not present

## 2020-11-27 DIAGNOSIS — Z85528 Personal history of other malignant neoplasm of kidney: Secondary | ICD-10-CM | POA: Diagnosis not present

## 2020-11-27 DIAGNOSIS — R531 Weakness: Secondary | ICD-10-CM | POA: Insufficient documentation

## 2020-11-27 DIAGNOSIS — Z8673 Personal history of transient ischemic attack (TIA), and cerebral infarction without residual deficits: Secondary | ICD-10-CM | POA: Diagnosis not present

## 2020-11-27 DIAGNOSIS — I129 Hypertensive chronic kidney disease with stage 1 through stage 4 chronic kidney disease, or unspecified chronic kidney disease: Secondary | ICD-10-CM | POA: Insufficient documentation

## 2020-11-27 DIAGNOSIS — L8990 Pressure ulcer of unspecified site, unspecified stage: Secondary | ICD-10-CM

## 2020-11-27 LAB — BASIC METABOLIC PANEL
Anion gap: 6 (ref 5–15)
BUN: 28 mg/dL — ABNORMAL HIGH (ref 8–23)
CO2: 29 mmol/L (ref 22–32)
Calcium: 10.3 mg/dL (ref 8.9–10.3)
Chloride: 101 mmol/L (ref 98–111)
Creatinine, Ser: 1.32 mg/dL — ABNORMAL HIGH (ref 0.44–1.00)
GFR, Estimated: 41 mL/min — ABNORMAL LOW (ref >60)
Glucose, Bld: 302 mg/dL — ABNORMAL HIGH (ref 70–99)
Potassium: 3.6 mmol/L (ref 3.5–5.1)
Sodium: 136 mmol/L (ref 135–145)

## 2020-11-27 LAB — URINALYSIS, ROUTINE W REFLEX MICROSCOPIC
Bilirubin Urine: NEGATIVE
Glucose, UA: >=500 mg/dL — AB
Ketones, ur: NEGATIVE mg/dL
Leukocytes,Ua: NEGATIVE
Nitrite: NEGATIVE
Protein, ur: 100 mg/dL — AB
Specific Gravity, Urine: 1.008 (ref 1.005–1.030)
pH: 7 (ref 5.0–8.0)

## 2020-11-27 LAB — CBC
HCT: 39.3 % (ref 36.0–46.0)
Hemoglobin: 13 g/dL (ref 12.0–15.0)
MCH: 29.6 pg (ref 26.0–34.0)
MCHC: 33.1 g/dL (ref 30.0–36.0)
MCV: 89.5 fL (ref 80.0–100.0)
Platelets: 187 10*3/uL (ref 150–400)
RBC: 4.39 MIL/uL (ref 3.87–5.11)
RDW: 12.6 % (ref 11.5–15.5)
WBC: 7.1 10*3/uL (ref 4.0–10.5)
nRBC: 0 % (ref 0.0–0.2)

## 2020-11-27 LAB — URINALYSIS, MICROSCOPIC (REFLEX)

## 2020-11-27 MED ORDER — CEPHALEXIN 250 MG PO CAPS: 250.0000 mg | ORAL_CAPSULE | Freq: Four times a day (QID) | ORAL | 0 refills | Status: DC

## 2020-11-27 MED ORDER — CEPHALEXIN 250 MG PO CAPS
250.0000 mg | ORAL_CAPSULE | Freq: Once | ORAL | Status: AC
  Administered 2020-11-27: 250 mg via ORAL
  Filled 2020-11-27: qty 1

## 2020-11-27 NOTE — ED Triage Notes (Signed)
20 g in left ac

## 2020-11-27 NOTE — ED Triage Notes (Signed)
CBG was 410  BP 200/100,

## 2020-11-27 NOTE — ED Triage Notes (Signed)
EMS stated, pt has weakness and some numbness per pt in rt. Hand , started this morning unable to determine when it started .Last normal yesterday afternoon at 1630. No obvious weakness noted. Pt. Stated, better now than this morning. Stroke before.

## 2020-11-27 NOTE — Discharge Instructions (Addendum)
The testing today was reassuring.  The CT scan showed prior lacunar infarcts.  She may be experiencing another lacunar infarct causing the right hand numbness.  The right hand numbness has improved so it is safe to go home today.  We are referring her to a neurologist to see for further evaluation.  We are going to treat her for a possible urinary tract infection with cephalexin.  Urine culture was sent which should have results in 1 or 2 days, to confirm presence or absence of a UTI.  If her condition worsens return here for further evaluation peer

## 2020-11-27 NOTE — ED Provider Notes (Signed)
[facility] EMERGENCY DEPARTMENT Provider Note   CSN: [id number] Arrival date & time: 11/27/20  1203     History Chief Complaint  Patient presents with  . Weakness    Dorothy Barker is a 80 y.o. female.  HPI She presents for evaluation of numbness in right hand, and has been found to be hyperglycemic.  She states that it started this morning but now is better.  She denies headache, neck pain, back pain, nausea, vomiting, focal weakness.  She states that she has a problem with her veins in her left leg which causes her left foot to be black and that she is going to see the doctor at [facility], next week for surgery.  There are no other known modifying factors.    Past Medical History:  Diagnosis Date  . 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 Chronic kidney disease   . Diabetes mellitus without complication ([place])   . Hyperlipidemia   . Hypertension   . Stroke ([place])   . Thyroid disease   . Vitamin D deficiency     There are no problems to display for this patient.   Past Surgical History:  Procedure Laterality Date  . ABDOMINAL HYSTERECTOMY    . KNEE SURGERY Right   . NEPHRECTOMY Left   . TONSILLECTOMY       OB History   No obstetric history on file.     No family history on file.  Social History   Tobacco Use  . Smoking status: Passive Smoke Exposure - Never Smoker  . Smokeless tobacco: Never Used  Vaping Use  . Vaping Use: Never used  Substance Use Topics  . Alcohol use: Never  . Drug use: Never    Home Medications Prior to Admission medications   Medication Sig Start Date End Date Taking? Authorizing Provider  acetaminophen (TYLENOL) 500 MG tablet Take 1,000 mg by mouth every 8 (eight) hours.   Yes [provider]  aspirin 81 MG EC tablet Take 81 mg by mouth every morning. 09/21/20  Yes [provider]  atorvastatin (LIPITOR) 40 MG tablet Take 40 mg by mouth at bedtime. 10/11/20  Yes [provider]   carvedilol (COREG) 6.25 MG tablet Take 6.25 mg by mouth 2 (two) times daily with a meal. 09/21/20  Yes [provider]  cephALEXin (KEFLEX) 250 MG capsule Take 1 capsule (250 mg total) by mouth 4 (four) times daily. 11/27/20  Yes Daleen Bo, MD  Dulaglutide (TRULICITY) 3.41 PF/7.9KW SOPN Inject 0.75 mg into the skin every Thursday. 09/21/20  Yes [provider]  ergocalciferol (VITAMIN D2) 1.25 MG (50000 UT) capsule Take 50,000 Units by mouth every Thursday. 09/21/20  Yes [provider]  insulin glargine (LANTUS) 100 unit/mL SOPN Inject 16 Units into the skin at bedtime.   Yes [provider]  lisinopril-hydrochlorothiazide (ZESTORETIC) 20-12.5 MG tablet Take 2 tablets by mouth every morning. 09/21/20  Yes [provider]  thyroid (ARMOUR) 60 MG tablet Take 60 mg by mouth every morning.   Yes [provider]  insulin aspart (NOVOLOG FLEXPEN) 100 UNIT/ML FlexPen Inject 2-4 Units into the skin See admin instructions. Inject 2-4 units subcutaneously before breakfast and supper: CBG 151-200 2 units, >200 4 units Patient not taking: Reported on 11/27/2020    [provider]    Allergies    Tramadol, Amlodipine besy-benazepril hcl, Codeine, and Naproxen  Review of Systems   Review of Systems  All  other systems reviewed and are negative.   Physical Exam Updated Vital Signs BP (!) 164/64   Pulse 71   Temp 97.6 F (36.4 C) (Oral)   Resp 15   SpO2 97%   Physical Exam Vitals and nursing note reviewed.  Constitutional:      General: She is not in acute distress.    Appearance: She is well-developed. She is obese. She is not ill-appearing, toxic-appearing or diaphoretic.     Comments: Elderly, frail  HENT:     Head: Normocephalic and atraumatic.     Right Ear: External ear normal.     Left Ear: External ear normal.     Mouth/Throat:     Mouth: Mucous membranes are moist.     Pharynx: No posterior oropharyngeal erythema.  Eyes:      Extraocular Movements: Extraocular movements intact.     Conjunctiva/sclera: Conjunctivae normal.     Pupils: Pupils are equal, round, and reactive to light.  Neck:     Trachea: Phonation normal.  Cardiovascular:     Rate and Rhythm: Normal rate and regular rhythm.     Heart sounds: Normal heart sounds.  Pulmonary:     Effort: Pulmonary effort is normal.     Breath sounds: Normal breath sounds.  Abdominal:     General: There is no distension.     Palpations: Abdomen is soft.     Tenderness: There is no abdominal tenderness.  Musculoskeletal:        General: Normal range of motion.     Cervical back: Normal range of motion and neck supple.  Skin:    General: Skin is warm and dry.     Comments: Feel has a dressing on it.  Dressing removed and the posterior heel has an area about as large as a quarter which appears to be necrosis from pressure sore.  This area is nontender to palpation, there is no associated fluctuance or drainage.  There is no associated erythema.  The wound was redressed.  Neurological:     Mental Status: She is alert and oriented to person, place, and time.     Cranial Nerves: No cranial nerve deficit.     Sensory: No sensory deficit.     Motor: No abnormal muscle tone.     Coordination: Coordination normal.     Comments: She reports slight difference in palpable sensation of the right hand as compared to the left.  No other appreciable dysesthesia.  No dysarthria or aphasia.  No nystagmus.  Psychiatric:        Mood and Affect: Mood normal.        Behavior: Behavior normal.        Thought Content: Thought content normal.        Judgment: Judgment normal.     ED Results / Procedures / Treatments   Labs (all labs ordered are listed, but only abnormal results are displayed) Labs Reviewed  BASIC METABOLIC PANEL - Abnormal; Notable for the following components:      Result Value   Glucose, Bld 302 (*)    BUN 28 (*)    Creatinine, Ser 1.32 (*)    GFR,  Estimated 41 (*)    All other components within normal limits  URINALYSIS, ROUTINE W REFLEX MICROSCOPIC - Abnormal; Notable for the following components:   APPearance CLOUDY (*)    Glucose, UA >=500 (*)    Hgb urine dipstick SMALL (*)    Protein, ur 100 (*)  All other components within normal limits  URINALYSIS, MICROSCOPIC (REFLEX) - Abnormal; Notable for the following components:   Bacteria, UA RARE (*)    All other components within normal limits  URINE CULTURE  CBC  CBG MONITORING, ED    EKG EKG Interpretation  Date/Time:  Saturday November 27 2020 12:14:55 EDT Ventricular Rate:  80 PR Interval:  341 QRS Duration: 137 QT Interval:  397 QTC Calculation: 458 R Axis:   -43 Text Interpretation: Sinus rhythm Prolonged PR interval RBBB and LAFB Probable left ventricular hypertrophy No old tracing to compare Confirmed by Daleen Bo [phone]) on 11/27/2020 12:48:30 PM   Radiology CT Head Wo Contrast  Result Date: 11/27/2020 CLINICAL DATA:  Right-sided weakness.  Frequent falls. EXAM: CT HEAD WITHOUT CONTRAST TECHNIQUE: Contiguous axial images were obtained from the base of the skull through the vertex without intravenous contrast. COMPARISON:  None. FINDINGS: Brain: No evidence of acute infarction, hemorrhage, hydrocephalus, extra-axial collection or mass lesion/mass effect. Chronic lacunar infarcts in the right basal ganglia and left thalamus. Mild-to-moderate generalized cerebral atrophy. Scattered mild periventricular and subcortical white matter hypodensities are nonspecific, but favored to reflect chronic microvascular ischemic changes. Vascular: Atherosclerotic vascular calcification of the carotid siphons. No hyperdense vessel. Skull: Negative for fracture or focal lesion. Sinuses/Orbits: No acute finding. Large retention cyst in the left maxillary sinus. Other: None. IMPRESSION: 1. No acute intracranial abnormality. 2. Chronic microvascular ischemic changes with old lacunar infarcts  in the right basal ganglia and left thalamus. Electronically Signed   By: Titus Dubin M.D.   On: 11/27/2020 13:40    Procedures Procedures   Medications Ordered in ED Medications  cephALEXin (KEFLEX) capsule 250 mg (has no administration in time range)    ED Course  I have reviewed the triage vital signs and the nursing notes.  Pertinent labs & imaging results that were available during my care of the patient were reviewed by me and considered in my medical decision making (see chart for details).  Clinical Course as of 11/27/20 1714  Sat Nov 27, 2020  1704 Patient's daughter is now here and states that the patient has a follow-up with neurology, later this month.  That appointment is in [place], and the patient now lives in [place].  The daughter requested referral to a neurologist in [place].  She states she is concerned that her mother is more confused than usual.  She states that earlier today the patient was weak and had trouble transferring from bed to commode.  We discussed all the findings, and recommended treatment.  Patient will be started on oral antibiotic here, then culture sent to determine if she actually has a UTI.  Urine culture was sent because the patient is symptomatic with confusion and has an abnormal urinalysis.  Daughter states that patient has an upcoming angiogram with possible stent placement in the left leg for arterial insufficiency.  This is scheduled for later this week for [EW]    Clinical Course User Index [EW] Daleen Bo, MD   MDM Rules/Calculators/A&P                           Patient Vitals for the past 24 hrs:  BP Temp Temp src Pulse Resp SpO2  11/27/20 1630 (!) 164/64 -- -- 71 15 97 %  11/27/20 1615 (!) 156/71 -- -- 72 15 97 %  11/27/20 1515 (!) 178/91 -- -- 79 (!) 29 97 %  11/27/20 1400 (!) 178/82 -- --  78 19 97 %  11/27/20 1215 (!) 208/81 -- -- 80 19 97 %  11/27/20 1214 (!) 208/81 97.6 F (36.4 C) Oral 78 17 99 %     12:27 PM Reevaluation with update and discussion. After initial assessment and treatment, an updated evaluation reveals patient states the numbness in her right hand is almost gone at this time.  Patient is comfortable at this time she is moving both arms and legs easily.  Findings discussed with patient, and daughter in the room, all questions answered. Daleen Bo   Medical Decision Making:  This patient is presenting for evaluation of numbness of right hand, which does require a range of treatment options, and is a complaint that involves a moderate risk of morbidity and mortality. The differential diagnoses include neuropathy, CVA, metabolic disorder. I decided to review old records, and in summary elderly female presenting with short-term onset of weakness isolated to the right hand.  I did not require additional historical information from anyone.  Clinical Laboratory Tests Ordered, included CBC, Metabolic panel and Urinalysis. Review indicates normal except glucose high, BUN high, creatinine high, GFR low, urine with elevated red and white cells, with rare bacteria.  Urine culture sent.. Radiologic Tests Ordered, included CT head.  I independently Visualized: Radiographic images, which show no acute changes but presence of lacunar infarcts    Critical Interventions-like evaluation, laboratory testing, CT imaging, observation and reassessment  After These Interventions, the Patient was reevaluated and was found stable for discharge.  Patient with improving numbness,, and evidence of prior lacunar infarcts.  She has stable diabetes at this time.  Daughter is concerned about confusion and weakness.  Doubt acute CVA, serious bacterial infection or metabolic instability. Patient started on empiric antibiotic with cephalexin, pending urine culture results.  CRITICAL CARE-no Performed by: Daleen Bo  Nursing Notes Reviewed/ Care Coordinated Applicable Imaging Reviewed Interpretation  of Laboratory Data incorporated into ED treatment  The patient appears reasonably screened and/or stabilized for discharge and I doubt any other medical condition or other [facility] requiring further screening, evaluation, or treatment in the ED at this time prior to discharge.    Plan: Home Medications-continue usual; Home Treatments-rest, fluids; return here if the recommended treatment, does not improve the symptoms; Recommended follow up-schedule follow-up as planned.  See neurologist later this month and [place] or call the local neurology office for follow-up appointment, if that works better.     Final Clinical Impression(s) / ED Diagnoses Final diagnoses:  Paresthesia  Weakness  Pressure injury of skin, unspecified injury stage, unspecified location    Rx / DC Orders ED Discharge Orders         Ordered    cephALEXin (KEFLEX) 250 MG capsule  4 times daily        11/27/20 1708           Daleen Bo, MD 11/27/20 1715

## 2020-11-30 ENCOUNTER — Encounter: Payer: Medicare PPO | Admitting: Vascular Surgery

## 2020-11-30 LAB — URINE CULTURE: Culture: >=100000 — AB

## 2020-12-01 ENCOUNTER — Other Ambulatory Visit: Payer: Self-pay

## 2020-12-01 ENCOUNTER — Telehealth: Payer: Self-pay | Admitting: *Deleted

## 2020-12-01 ENCOUNTER — Ambulatory Visit (HOSPITAL_COMMUNITY)
Admission: RE | Admit: 2020-12-01 | Discharge: 2020-12-01 | Disposition: A | Discharge status: Home / Self Care | Payer: Medicare PPO | Attending: Vascular Surgery | Admitting: Vascular Surgery

## 2020-12-01 ENCOUNTER — Encounter (HOSPITAL_COMMUNITY)
Admission: RE | Disposition: A | Discharge status: Home / Self Care | Payer: Self-pay | Source: Home / Self Care | Attending: Vascular Surgery

## 2020-12-01 DIAGNOSIS — L97429 Non-pressure chronic ulcer of left heel and midfoot with unspecified severity: Secondary | ICD-10-CM | POA: Diagnosis not present

## 2020-12-01 DIAGNOSIS — Z886 Allergy status to analgesic agent status: Secondary | ICD-10-CM | POA: Diagnosis not present

## 2020-12-01 DIAGNOSIS — E11621 Type 2 diabetes mellitus with foot ulcer: Secondary | ICD-10-CM | POA: Diagnosis not present

## 2020-12-01 DIAGNOSIS — Z7982 Long term (current) use of aspirin: Secondary | ICD-10-CM | POA: Diagnosis not present

## 2020-12-01 DIAGNOSIS — I70244 Atherosclerosis of native arteries of left leg with ulceration of heel and midfoot: Secondary | ICD-10-CM | POA: Diagnosis not present

## 2020-12-01 DIAGNOSIS — Z885 Allergy status to narcotic agent status: Secondary | ICD-10-CM | POA: Diagnosis not present

## 2020-12-01 DIAGNOSIS — Z79899 Other long term (current) drug therapy: Secondary | ICD-10-CM | POA: Diagnosis not present

## 2020-12-01 DIAGNOSIS — Z888 Allergy status to other drugs, medicaments and biological substances status: Secondary | ICD-10-CM | POA: Diagnosis not present

## 2020-12-01 DIAGNOSIS — Z20822 Contact with and (suspected) exposure to covid-19: Secondary | ICD-10-CM | POA: Diagnosis not present

## 2020-12-01 DIAGNOSIS — E1151 Type 2 diabetes mellitus with diabetic peripheral angiopathy without gangrene: Secondary | ICD-10-CM | POA: Diagnosis present

## 2020-12-01 DIAGNOSIS — Z794 Long term (current) use of insulin: Secondary | ICD-10-CM | POA: Insufficient documentation

## 2020-12-01 HISTORY — PX: PERIPHERAL VASCULAR INTERVENTION: CATH118257

## 2020-12-01 HISTORY — PX: ABDOMINAL AORTOGRAM W/LOWER EXTREMITY: CATH118223

## 2020-12-01 LAB — POCT I-STAT, CHEM 8
BUN: 38 mg/dL — ABNORMAL HIGH (ref 8–23)
Calcium, Ion: 1.39 mmol/L (ref 1.15–1.40)
Chloride: 102 mmol/L (ref 98–111)
Creatinine, Ser: 1.4 mg/dL — ABNORMAL HIGH (ref 0.44–1.00)
Glucose, Bld: 232 mg/dL — ABNORMAL HIGH (ref 70–99)
HCT: 41 % (ref 36.0–46.0)
Hemoglobin: 13.9 g/dL (ref 12.0–15.0)
Potassium: 4 mmol/L (ref 3.5–5.1)
Sodium: 140 mmol/L (ref 135–145)
TCO2: 28 mmol/L (ref 22–32)

## 2020-12-01 LAB — SARS CORONAVIRUS 2 BY RT PCR (HOSPITAL ORDER, PERFORMED IN ~~LOC~~ HOSPITAL LAB): SARS Coronavirus 2: NEGATIVE

## 2020-12-01 SURGERY — ABDOMINAL AORTOGRAM W/LOWER EXTREMITY
Anesthesia: LOCAL

## 2020-12-01 MED ORDER — HYDRALAZINE HCL 20 MG/ML IJ SOLN: 5.0000 mg | INTRAMUSCULAR | Status: DC | PRN

## 2020-12-01 MED ORDER — ACETAMINOPHEN 325 MG PO TABS: 650.0000 mg | ORAL_TABLET | ORAL | Status: DC | PRN

## 2020-12-01 MED ORDER — FENTANYL CITRATE (PF) 100 MCG/2ML IJ SOLN
INTRAMUSCULAR | Status: AC
  Filled 2020-12-01: qty 2

## 2020-12-01 MED ORDER — LIDOCAINE HCL (PF) 1 % IJ SOLN
INTRAMUSCULAR | Status: AC
  Filled 2020-12-01: qty 30

## 2020-12-01 MED ORDER — LIDOCAINE HCL (PF) 1 % IJ SOLN
INTRAMUSCULAR | Status: DC | PRN
  Administered 2020-12-01: 20 mL via INTRADERMAL

## 2020-12-01 MED ORDER — CLOPIDOGREL BISULFATE 75 MG PO TABS: 75.0000 mg | ORAL_TABLET | Freq: Every day | ORAL | Status: DC

## 2020-12-01 MED ORDER — SODIUM CHLORIDE 0.9% FLUSH: 3.0000 mL | INTRAVENOUS | Status: DC | PRN

## 2020-12-01 MED ORDER — HEPARIN (PORCINE) IN NACL 1000-0.9 UT/500ML-% IV SOLN
INTRAVENOUS | Status: AC
  Filled 2020-12-01: qty 1000

## 2020-12-01 MED ORDER — SODIUM CHLORIDE 0.9 % IV SOLN: 250.0000 mL | INTRAVENOUS | Status: DC | PRN

## 2020-12-01 MED ORDER — ONDANSETRON HCL 4 MG/2ML IJ SOLN: 4.0000 mg | Freq: Four times a day (QID) | INTRAMUSCULAR | Status: DC | PRN

## 2020-12-01 MED ORDER — LABETALOL HCL 5 MG/ML IV SOLN
INTRAVENOUS | Status: AC
  Filled 2020-12-01: qty 4

## 2020-12-01 MED ORDER — PROTAMINE SULFATE 10 MG/ML IV SOLN
INTRAVENOUS | Status: AC
  Filled 2020-12-01: qty 5

## 2020-12-01 MED ORDER — IODIXANOL 320 MG/ML IV SOLN
INTRAVENOUS | Status: DC | PRN
  Administered 2020-12-01: 28 mL

## 2020-12-01 MED ORDER — SODIUM CHLORIDE 0.9 % IV SOLN: INTRAVENOUS | Status: DC

## 2020-12-01 MED ORDER — HEPARIN SODIUM (PORCINE) 1000 UNIT/ML IJ SOLN
INTRAMUSCULAR | Status: AC
  Filled 2020-12-01: qty 1

## 2020-12-01 MED ORDER — SODIUM CHLORIDE 0.9% FLUSH: 3.0000 mL | Freq: Two times a day (BID) | INTRAVENOUS | Status: DC

## 2020-12-01 MED ORDER — CLOPIDOGREL BISULFATE 300 MG PO TABS
ORAL_TABLET | ORAL | Status: DC | PRN
  Administered 2020-12-01: 300 mg via ORAL

## 2020-12-01 MED ORDER — LABETALOL HCL 5 MG/ML IV SOLN: 10.0000 mg | INTRAVENOUS | Status: DC | PRN

## 2020-12-01 MED ORDER — SODIUM CHLORIDE 0.9 % WEIGHT BASED INFUSION: 1.0000 mL/kg/h | INTRAVENOUS | Status: DC

## 2020-12-01 MED ORDER — CLOPIDOGREL BISULFATE 300 MG PO TABS
ORAL_TABLET | ORAL | Status: AC
  Filled 2020-12-01: qty 1

## 2020-12-01 MED ORDER — FENTANYL CITRATE (PF) 100 MCG/2ML IJ SOLN
INTRAMUSCULAR | Status: DC | PRN
  Administered 2020-12-01: 50 ug via INTRAVENOUS

## 2020-12-01 MED ORDER — MIDAZOLAM HCL 2 MG/2ML IJ SOLN
INTRAMUSCULAR | Status: AC
  Filled 2020-12-01: qty 2

## 2020-12-01 MED ORDER — HEPARIN SODIUM (PORCINE) 1000 UNIT/ML IJ SOLN
INTRAMUSCULAR | Status: DC | PRN
  Administered 2020-12-01: 8000 [IU] via INTRAVENOUS

## 2020-12-01 MED ORDER — MIDAZOLAM HCL 2 MG/2ML IJ SOLN
INTRAMUSCULAR | Status: DC | PRN
  Administered 2020-12-01: 1 mg via INTRAVENOUS

## 2020-12-01 MED ORDER — HEPARIN (PORCINE) IN NACL 1000-0.9 UT/500ML-% IV SOLN
INTRAVENOUS | Status: DC | PRN
  Administered 2020-12-01 (×2): 500 mL

## 2020-12-01 MED ORDER — CLOPIDOGREL BISULFATE 75 MG PO TABS: 300.0000 mg | ORAL_TABLET | Freq: Once | ORAL | Status: DC

## 2020-12-01 MED ORDER — PROTAMINE SULFATE 10 MG/ML IV SOLN
INTRAVENOUS | Status: DC | PRN
  Administered 2020-12-01: 5 mg via INTRAVENOUS
  Administered 2020-12-01: 45 mg via INTRAVENOUS

## 2020-12-01 MED ORDER — LABETALOL HCL 5 MG/ML IV SOLN
INTRAVENOUS | Status: DC | PRN
  Administered 2020-12-01: 10 mg via INTRAVENOUS

## 2020-12-01 SURGICAL SUPPLY — 23 items
BALLN MUSTANG 5X60X135 (BALLOONS) ×3
BALLOON MUSTANG 5X60X135 (BALLOONS) ×2 IMPLANT
CATH NAVICROSS ANG 65CM (CATHETERS) ×2 IMPLANT
CATH NAVICROSS ANGLED 90CM (MICROCATHETER) ×3 IMPLANT
CATH OMNI FLUSH 5F 65CM (CATHETERS) ×3 IMPLANT
CATHETER NAVICROSS ANG 65CM (CATHETERS) ×3
CLOSURE PERCLOSE PROSTYLE (VASCULAR PRODUCTS) ×3 IMPLANT
DEVICE TORQUE H2O (MISCELLANEOUS) ×3 IMPLANT
FILTER CO2 0.2 MICRON (VASCULAR PRODUCTS) ×3 IMPLANT
GLIDEWIRE ADV .035X260CM (WIRE) ×3 IMPLANT
GUIDEWIRE ANGLED .035X150CM (WIRE) ×3 IMPLANT
KIT MICROPUNCTURE NIT STIFF (SHEATH) ×3 IMPLANT
KIT PV (KITS) ×3 IMPLANT
RESERVOIR CO2 (VASCULAR PRODUCTS) ×3 IMPLANT
SET FLUSH CO2 (MISCELLANEOUS) ×3 IMPLANT
SHEATH HIGHFLEX ANSEL 6FRX55 (SHEATH) ×3 IMPLANT
SHEATH PINNACLE 5F 10CM (SHEATH) ×3 IMPLANT
SHEATH PROBE COVER 6X72 (BAG) ×3 IMPLANT
STENT ELUVIA 6X60X130 (Permanent Stent) ×3 IMPLANT
TRANSDUCER W/STOPCOCK (MISCELLANEOUS) ×3 IMPLANT
TRAY PV CATH (CUSTOM PROCEDURE TRAY) ×3 IMPLANT
WIRE BENTSON .035X145CM (WIRE) ×3 IMPLANT
WIRE HI TORQ VERSACORE J 260CM (WIRE) ×3 IMPLANT

## 2020-12-01 NOTE — Progress Notes (Signed)
Daughter given oral instructions via phone with good understanding. [facility] contacted with post op instructions including need for sitter tonight to monitor pt.

## 2020-12-01 NOTE — Telephone Encounter (Signed)
Post ED Visit - Positive Culture Follow-up  Culture report reviewed by antimicrobial stewardship pharmacist: [place] Team []  Elenor Quinones, Pharm.D. []  Heide Guile, Pharm.D., BCPS AQ-ID []  Parks Neptune, Pharm.D., BCPS []  Alycia Rossetti, Pharm.D., BCPS []  [place], [place].D., BCPS, AAHIVP []  Legrand Como, Pharm.D., BCPS, AAHIVP []  Salome Arnt, PharmD, BCPS []  Johnnette Gourd, PharmD, BCPS []  Hughes Better, PharmD, BCPS []  Leeroy Cha, PharmD []  Laqueta Linden, PharmD, BCPS []  Albertina Parr, PharmD  [place] Team []  Leodis Sias, PharmD []  Lindell Spar, PharmD []  Royetta Asal, PharmD []  Graylin Shiver, Rph []  Rema Fendt) Glennon Mac, PharmD []  Arlyn Dunning, PharmD []  Netta Cedars, PharmD []  Dia Sitter, PharmD []  Leone Haven, PharmD []  Gretta Arab, PharmD []  Theodis Shove, PharmD []  Peggyann Juba, PharmD []  Reuel Boom, PharmD   Positive urine culture Treated with Cephalexin, organism sensitive to the same and no further patient follow-up is required at this time.  Harlon Flor [facility] 12/01/2020, 10:31 AM

## 2020-12-01 NOTE — Discharge Instructions (Signed)
Femoral Site Care  This sheet gives you information about how to care for yourself after your procedure. Your health care provider may also give you more specific instructions. If you have problems or questions, contact your health care provider. What can I expect after the procedure? After the procedure, it is common to have:  Bruising that usually fades within 1-2 weeks.  Tenderness at the site. Follow these instructions at home: Wound care  Follow instructions from your health care provider about how to take care of your insertion site. Make sure you: ? Wash your hands with soap and water before you change your bandage (dressing). If soap and water are not available, use hand sanitizer. ? Change your dressing as told by your health care provider. ? Leave stitches (sutures), skin glue, or adhesive strips in place. These skin closures may need to stay in place for 2 weeks or longer. If adhesive strip edges start to loosen and curl up, you may trim the loose edges. Do not remove adhesive strips completely unless your health care provider tells you to do that.  Do not take baths, swim, or use a hot tub until your health care provider approves.  You may shower 24-48 hours after the procedure or as told by your health care provider. ? Gently wash the site with plain soap and water. ? Pat the area dry with a clean towel. ? Do not rub the site. This may cause bleeding.  Do not apply powder or lotion to the site. Keep the site clean and dry.  Check your femoral site every day for signs of infection. Check for: ? Redness, swelling, or pain. ? Fluid or blood. ? Warmth. ? Pus or a bad smell. Activity  For the first 2-3 days after your procedure, or as long as directed: ? Avoid climbing stairs as much as possible. ? Do not squat.  Do not lift anything that is heavier than 10 lb (4.5 kg), or the limit that you are told, until your health care provider says that it is safe.  Rest as  directed. ? Avoid sitting for a long time without moving. Get up to take short walks every 1-2 hours.  Do not drive for 24 hours if you were given a medicine to help you relax (sedative). General instructions  Take over-the-counter and prescription medicines only as told by your health care provider.  Keep all follow-up visits as told by your health care provider. This is important. Contact a health care provider if you have:  A fever or chills.  You have redness, swelling, or pain around your insertion site. Get help right away if:  The catheter insertion area swells very fast.  You pass out.  You suddenly start to sweat or your skin gets clammy.  The catheter insertion area is bleeding, and the bleeding does not stop when you hold steady pressure on the area.  The area near or just beyond the catheter insertion site becomes pale, cool, tingly, or numb. These symptoms may represent a serious problem that is an emergency. Do not wait to see if the symptoms will go away. Get medical help right away. Call your local emergency services (911 in the [place] Do not drive yourself to the hospital. Summary  After the procedure, it is common to have bruising that usually fades within 1-2 weeks.  Check your femoral site every day for signs of infection.  Do not lift anything that is heavier than 10 lb (4.5 kg), or   the limit that you are told, until your health care provider says that it is safe. This information is not intended to replace advice given to you by your health care provider. Make sure you discuss any questions you have with your health care provider. Document Revised: 04/09/2020 Document Reviewed: 04/09/2020 Elsevier Patient Education  2021 Elsevier Inc.  

## 2020-12-01 NOTE — Progress Notes (Signed)
Telephone call with Director at facility.  States patient has a call bell to call for help but sitters are not available,  Explained to daughter conversation with facility.  Daughter to ensure patient can reach call bell and staff understands need for quick response.

## 2020-12-01 NOTE — Op Note (Signed)
DATE OF SERVICE: 12/01/2020  PATIENT:  Dorothy Barker  80 y.o. 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  TOURNIQUET:  None  PATIENT DISPOSITION:  PACU - hemodynamically stable.   Delay start of Pharmacological VTE agent (>24hrs) due to surgical blood loss or risk of bleeding: no  INDICATION FOR PROCEDURE: Dorothy Barker is a 80 y.o. female with a decubitus heel ulcer and non-invasive vascular lab evidence of peripheral arterial disease. After careful discussion of risks, benefits, and alternatives the patient was offered angiography with intervention. We specifically discussed access site complications. The patient understood and wished to proceed.  OPERATIVE FINDINGS:  CO2 aortogram revealed no flow limiting stenosis in terminal aorta or iliac arteries bilaterally. Steeply angulated aortic bifurcation Left lower extremity angiography:   Widely patent common femoral, profundofemoral, superficial femoral arteries   Critical stenosis of above-knee popliteal artery  Remainder of popliteal artery widely patent  Anterior tibial artery occluded  Two-vessel runoff to the ankle via the peroneal and PT  PT is dominant flow to the foot.  DESCRIPTION OF PROCEDURE: After identification of the patient in the pre-operative holding area, the patient was transferred to the  operating room. The patient was positioned supine on the operating room table. Anesthesia was induced. The groins was prepped and draped in standard fashion. A surgical pause was performed confirming correct patient, procedure, and operative location.  The right groin was anesthetized with subcutaneous injection of 1% lidocaine. Using ultrasound guidance, the right common femoral artery was accessed with micropuncture technique. Fluoroscopy was used to confirm cannulation over the femoral head. Sheathogram was not performed. The 91F sheath was upsized to 70F.   An 035 glidewire advantage was advanced into the distal aorta. Over the wire an omni flush catheter was advanced to the level of L2. Aortogram was performed - see above for details.   The left common iliac artery was selected with the 035 glidewire advantage. The wire was advanced into the common femoral artery. Over the wire the omni flush catheter was advanced into the external iliac artery. Selective angiography was performed - see above for details.   The decision was made to intervene. The patient was heparinized with 8000 units of heparin. The sheath was exchanged for a 35F x 55cm sheath. Selective angiography of the left lower extremity was performed prior to intervention. The lesions were treated with remarry stenting (6 x 60 mm Eluvia postdilated by 5 x 60 mm Mustang balloon).  Completion angiography revealed of the popliteal critical stenosis.  A Perclose device was used to close the arteriotomy. Hemostasis was excellent upon completion.  Conscious sedation was administered with the use of IV fentanyl and midazolam under continuous physician and nurse monitoring.  Heart rate, blood pressure, and oxygen saturation were continuously monitored.  Total sedation time was 59 minutes  Upon completion of the case instrument and sharps counts were  confirmed correct. The patient was transferred to the PACU in good condition. I was present for all  portions of the procedure.  PLAN: Start dual antiplatelet therapy.  Patient needs to continue aspirin indefinitely.  Patient should continue Plavix for at least 12 months (end date 12/01/2021).  Patient should continue statin therapy indefinitely.  Continue meticulous local wound care to the heel.  Follow-up with me in 1 month with ABI and arterial duplex of left lower extremity.  Yevonne Aline. Stanford Breed, MD [facility] Phone Number: [phone] 12/01/2020 1:55 PM

## 2020-12-01 NOTE — Interval H&P Note (Signed)
History and Physical Interval Note:  12/01/2020 11:54 AM  Dorothy Barker  has presented today for surgery, with the diagnosis of pad.  The various methods of treatment have been discussed with the patient and family. After consideration of risks, benefits and other options for treatment, the patient has consented to  Procedure(s): ABDOMINAL AORTOGRAM W/LOWER EXTREMITY (N/A) as a surgical intervention.  The patient's history has been reviewed, patient examined, no change in status, stable for surgery.  I have reviewed the patient's chart and labs.  Questions were answered to the patient's satisfaction.     Cherre Robins

## 2020-12-02 ENCOUNTER — Other Ambulatory Visit: Payer: Self-pay | Admitting: Vascular Surgery

## 2020-12-02 ENCOUNTER — Encounter (HOSPITAL_COMMUNITY): Payer: Self-pay | Admitting: Vascular Surgery

## 2020-12-02 MED ORDER — CLOPIDOGREL BISULFATE 75 MG PO TABS: 75.0000 mg | ORAL_TABLET | Freq: Every day | ORAL | 11 refills | Status: DC

## 2020-12-09 ENCOUNTER — Other Ambulatory Visit: Payer: Self-pay

## 2020-12-09 ENCOUNTER — Encounter (HOSPITAL_BASED_OUTPATIENT_CLINIC_OR_DEPARTMENT_OTHER): Payer: Medicare PPO | Attending: Internal Medicine | Admitting: Internal Medicine

## 2020-12-09 DIAGNOSIS — I10 Essential (primary) hypertension: Secondary | ICD-10-CM | POA: Diagnosis not present

## 2020-12-09 DIAGNOSIS — E11621 Type 2 diabetes mellitus with foot ulcer: Secondary | ICD-10-CM | POA: Diagnosis not present

## 2020-12-09 DIAGNOSIS — Z95820 Peripheral vascular angioplasty status with implants and grafts: Secondary | ICD-10-CM | POA: Diagnosis not present

## 2020-12-09 DIAGNOSIS — I872 Venous insufficiency (chronic) (peripheral): Secondary | ICD-10-CM | POA: Diagnosis not present

## 2020-12-09 DIAGNOSIS — Z885 Allergy status to narcotic agent status: Secondary | ICD-10-CM | POA: Insufficient documentation

## 2020-12-09 DIAGNOSIS — E11622 Type 2 diabetes mellitus with other skin ulcer: Secondary | ICD-10-CM | POA: Diagnosis not present

## 2020-12-09 DIAGNOSIS — L97822 Non-pressure chronic ulcer of other part of left lower leg with fat layer exposed: Secondary | ICD-10-CM | POA: Insufficient documentation

## 2020-12-09 DIAGNOSIS — L89620 Pressure ulcer of left heel, unstageable: Secondary | ICD-10-CM | POA: Insufficient documentation

## 2020-12-09 DIAGNOSIS — Z888 Allergy status to other drugs, medicaments and biological substances status: Secondary | ICD-10-CM | POA: Diagnosis not present

## 2020-12-09 NOTE — Progress Notes (Signed)
Dorothy Barker ([id number]) Visit Report for 12/09/2020 Chief Complaint Document Details Patient Name: Date of Service: Dorothy Barker 12/09/2020 10:00 A M Medical Record Number: [id number] Patient Account Number: [id number] Date of Birth/Sex: Treating RN: May 06, 1941 (80 y.o. Dorothy Barker Primary Care Provider: MO Dorothy Barker, Barker Other Clinician: Referring Provider: Treating Provider/Extender: Dorothy Barker Dorothy RGA N, Barker Weeks in Treatment: 6 Information Obtained from: Patient Chief Complaint Left heel and leg leg ulcer Electronic Signature(s) Signed: 12/09/2020 11:15:13 AM By: Dorothy Shan DO Entered By: Dorothy Barker on 12/09/2020 10:58:23 -------------------------------------------------------------------------------- Debridement Details Patient Name: Date of Service: Dorothy Barker 12/09/2020 10:00 [place] Record Number: [id number] Patient Account Number: [id number] Date of Birth/Sex: Treating RN: March 19, 1941 (80 y.o. Dorothy Barker Primary Care Provider: MO Dorothy Barker, Barker Other Clinician: Referring Provider: Treating Provider/Extender: Dorothy Barker Dorothy RGA N, Barker Weeks in Treatment: 6 Debridement Performed for Assessment: Wound #1 Left Calcaneus Performed By: Clinician Dorothy Gouty, RN Debridement Type: Chemical/Enzymatic/Mechanical Agent Used: Santyl Severity of Tissue Pre Debridement: Limited to breakdown of skin Level of Consciousness (Pre-procedure): Awake and Alert Pre-procedure Verification/Time Out No Taken: Bleeding: None Response to Treatment: Procedure was tolerated well Level of Consciousness (Post- Awake and Alert procedure): Post Debridement Measurements of Total Wound Length: (cm) 1.7 Stage: Unstageable/Unclassified Width: (cm) 2.7 Depth: (cm) 0.1 Volume: (cm) 0.36 Character of Wound/Ulcer Post Debridement: Requires Further Debridement Severity of Tissue Post Debridement: Limited to breakdown of skin Post Procedure  Diagnosis Same as Pre-procedure Electronic Signature(s) Signed: 12/09/2020 11:15:13 AM By: Dorothy Shan DO Signed: 12/09/2020 5:27:07 PM By: Dorothy Barker Entered By: Dorothy Barker on 12/09/2020 11:06:42 -------------------------------------------------------------------------------- HPI Details Patient Name: Date of Service: Dorothy Barker 12/09/2020 10:00 A M Medical Record Number: [id number] Patient Account Number: [id number] Date of Birth/Sex: Treating RN: 02-Apr-1941 (80 y.o. Dorothy Barker Primary Care Provider: MO Dorothy Barker, Barker Other Clinician: Referring Provider: Treating Provider/Extender: Dorothy Barker Dorothy RGA N, Barker Weeks in Treatment: 6 History of Present Illness HPI Description: 10/26/2020 upon evaluation today patient appears to be doing somewhat poorly in regard to wounds on her left heel and left lower leg. The leg is really not nearly as bad as the heel ulcer however. This is an unstageable pressure ulcer unfortunately that occurred when the patient was in the hospital secondary to a stroke. At this point the patient is seen with her daughter as well and both are able to answer and respond to questions. With that being said that the biggest issue right now is that the eschar on the heel is not really stable I think that this is going to need to be addressed with more specific and appropriate dressings currently. Apparently she did see a podiatrist more recently and they did perform some sharp debridement to clear away necrotic tissue here on the heel. T be honest the patient's daughter is not extremely pleased about that she states that they heal was completely dry and stable at that point. With that o being said obviously I did not see anything at that time and I cannot really speak to what the heel look like and then but right now it definitely appears to be much softer and I do not think just Betadine is good to do the job. I think we have to focus on trying to get  the eschar off of the heel at this point. The patient is in a assisted living facility currently. With that being said the daughter also questions whether  or not she is going require skilled nursing. She does have a history of diabetes mellitus type 2. She also has a history of chronic venous insufficiency. 3/15; patient admitted to the clinic last week. She has an unstageable pressure ulcer on the left heel that apparently occurred at [facility] when she was recovering from the stroke. She [facility] time [facility] and now is in [facility] assisted living. She has home health twice a week we have been using Iodoflex on this area. It was pointed out last week that she does not have palpable pulses below the femoral we ordered arterial studies but her daughter has not heard anything about this we will recheck this. 3/22; unstageable pressure ulcer on the left heel and an area on her left posterior calf. She had her this showed an ABI in the left of 1.15 however monophasic waveforms in the great toe TBI of 0.18 with a pressure of 39. Doppler analysis revealed monophasic waveforms at the CFA which by my way of thinking might suggest proximal disease she had a 50-74 stenosis of the distal SFA with a focal velocity elevation of 357 cm/s. We have been using Iodoflex to both wound areas. The patient does not really complain of a lot of pain but she is not that ambulatory. 3/29 still unstageable area with eschar on the tip of the left heel. This is gradually separating. We have a vascular consult on April 5 4/21; patient was last seen almost 1 month ago. She was seen by Dr. Roselie Awkward and had a angiogram of the left lower extremity with above-the-knee popliteal artery stenting. Patient states she feels well and has no complaints today. Electronic Signature(s) Signed: 12/09/2020 11:15:13 AM By: Dorothy Shan DO Entered By: Dorothy Barker on 12/09/2020  11:13:13 -------------------------------------------------------------------------------- Physical Exam Details Patient Name: Date of Service: Dorothy Barker 12/09/2020 10:00 A M Medical Record Number: [id number] Patient Account Number: [id number] Date of Birth/Sex: Treating RN: [birth year] (80 y.o. Dorothy Barker Primary Care Provider: MO Dorothy Barker, Barker Other Clinician: Referring Provider: Treating Provider/Extender: Dorothy Barker Dorothy RGA N, Barker Weeks in Treatment: 6 Constitutional respirations regular, non-labored and within target range for patient.. Cardiovascular 2+ dorsalis pedis/posterior tibialis pulses. Psychiatric pleasant and cooperative. Notes Left heel: Necrotic black eschar present. Electronic Signature(s) Signed: 12/09/2020 11:15:13 AM By: Dorothy Shan DO Entered By: Dorothy Barker on 12/09/2020 11:02:10 -------------------------------------------------------------------------------- Physician Orders Details Patient Name: Date of Service: Dorothy Barker 12/09/2020 10:00 A M Medical Record Number: [id number] Patient Account Number: [id number] Date of Birth/Sex: Treating RN: [birth year] (80 y.o. Helene Shoe, Meta.Reding Primary Care Provider: MO RGA N, Barker Other Clinician: Referring Provider: Treating Provider/Extender: Dorothy Barker Dorothy RGA N, Barker Weeks in Treatment: 6 Verbal / Phone Orders: No Diagnosis Coding ICD-10 Coding Code Description L89.620 Pressure ulcer of left heel, unstageable I87.2 Venous insufficiency (chronic) (peripheral) E11.621 Type 2 diabetes mellitus with foot ulcer L97.822 Non-pressure chronic ulcer of other part of left lower leg with fat layer exposed Follow-up Appointments ppointment in 2 weeks. - Tuesday Return A Bathing/ Shower/ Hygiene May shower with protection but do not get wound dressing(s) wet. - patient may shower with cast protector with normal showering schedule. patient may wash with soap and water wound, leg,  and foot with dressing changes. Edema Control - Lymphedema / SCD / Other Elevate legs to the level of the heart or above for 30 minutes daily and/or when sitting, a frequency of: Avoid standing for long  periods of time. Moisturize legs daily. Off-Loading Other: - offloading shoe for left heel when pt. is up and walking. Pt. may wear pressure-relieving boots at bedtime. Patient may walk with PT as long as patient is wearing offloading shoe. Home Health New wound care orders this week; continue Home Health for wound care. May utilize formulary equivalent dressing for wound treatment orders unless otherwise specified. [facility] home health three times a week. Every other Tuesday at wound center and all other days daughter to change. Wound Treatment Consults nkle - Referral to [facility] for nail trimming. - (ICD10 E11.621 - Type 2 diabetes mellitus with foot ulcer) Podiatry- [facility] Patient Medications llergies: Lotrel, codeine, tramadol A Notifications Medication Indication Start End Santyl DOSE 1 - topical 250 unit/gram ointment - apply to the black eschar once daily Electronic Signature(s) Signed: 12/09/2020 11:15:13 AM By: Dorothy Shan DO Previous Signature: 12/09/2020 11:06:46 AM Version By: Dorothy Shan DO Entered By: Dorothy Barker on 12/09/2020 11:07:20 Prescription 12/09/2020 -------------------------------------------------------------------------------- Fae Pippin B. Dorothy Shan DO Patient Name: Provider: 11/29/1940 [id number] Date of Birth: NPI#: [id number] Sex: DEA #: [id number] Phone #: License #: [place] Patient Address: [address] [place] [address] [place], [place] [address] [place], [place] [address] [phone] Allergies Lotrel; codeine; tramadol Provider's Orders nkle - ICD10: E11.621 - Referral to [facility] for nail trimming. Podiatry- [facility] Signature: Date(s): Electronic Signature(s) Signed: 12/09/2020 11:15:13 AM By: Dorothy Shan DO Entered By: Dorothy Barker on 12/09/2020 11:07:20 -------------------------------------------------------------------------------- Problem List Details Patient Name: Date of Service: Dorothy Barker 12/09/2020 10:00 A M Medical Record Number: [id number] Patient Account Number: [id number] Date of Birth/Sex: Treating RN: 11-Dec-1940 (80 y.o. 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 Dorothy Barker Primary Care Provider: MO Dorothy Barker, Barker Other Clinician: Referring Provider: Treating Provider/Extender: Dorothy Barker Dorothy RGA N, Barker Weeks in Treatment: 6 Subjective Chief Complaint Information obtained from Patient Left heel and leg leg ulcer History of Present Illness (HPI) 10/26/2020 upon evaluation today patient appears to be doing somewhat poorly in regard to wounds on her left heel and left lower leg.  The leg is really not nearly as bad as the heel ulcer however. This is an unstageable pressure ulcer unfortunately that occurred when the patient was in the hospital secondary to a stroke. At this point the patient is seen with her daughter as well and both are able to answer and respond to questions. With that being said that the biggest issue right now is that the eschar on the heel is not really stable I think that this is going to need to be addressed with more  specific and appropriate dressings currently. Apparently she did see a podiatrist more recently and they did perform some sharp debridement to clear away necrotic tissue here on the heel. T be honest o the patient's daughter is not extremely pleased about that she states that they heal was completely dry and stable at that point. With that being said obviously I did not see anything at that time and I cannot really speak to what the heel look like and then but right now it definitely appears to be much softer and I do not think just Betadine is good to do the job. I think we have to focus on trying to get the eschar off of the heel at this point. The patient is in a assisted living facility currently. With that being said the daughter also questions whether or not she is going require skilled nursing. She does have a history of diabetes mellitus type 2. She also has a history of chronic venous insufficiency. 3/15; patient admitted to the clinic last week. She has an unstageable pressure ulcer on the left heel that apparently occurred at [facility] when she was recovering from the stroke. She spent some time at the rehab unit of [facility] and now is in [facility] assisted living. She has home health twice a week we have been using Iodoflex on this area. It was pointed out last week that she does not have palpable pulses below the femoral we ordered arterial studies but her daughter has not heard anything about this we will recheck  this. 3/22; unstageable pressure ulcer on the left heel and an area on her left posterior calf. She had her this showed an ABI in the left of 1.15 however monophasic waveforms in the great toe TBI of 0.18 with a pressure of 39. Doppler analysis revealed monophasic waveforms at the CFA which by my way of thinking might suggest proximal disease she had a 50-74 stenosis of the distal SFA with a focal velocity elevation of 357 cm/s. We have been using Iodoflex to both wound areas. The patient does not really complain of a lot of pain but she is not that ambulatory. 3/29 still unstageable area with eschar on the tip of the left heel. This is gradually separating. We have a vascular consult on April 5 4/21; patient was last seen almost 1 month ago. She was seen by Dr. Roselie Awkward and had a angiogram of the left lower extremity with above-the-knee popliteal artery stenting. Patient states she feels well and has no complaints today. Patient History Information obtained from Patient. Family History Cancer - Mother,Maternal Grandparents, Diabetes - Child, Hypertension - Father, Stroke - Mother,Father, No family history of Heart Disease, Hereditary Spherocytosis, Kidney Disease, Lung Disease, Seizures, Thyroid Problems, Tuberculosis. Social History Never smoker, Marital Status - Divorced, Alcohol Use - Never, Drug Use - No History, Caffeine Use - Daily. Medical History Eyes Patient has history of Cataracts Denies history of Glaucoma, Optic Neuritis Cardiovascular Patient has history of Hypertension Endocrine Patient has history of Type II Diabetes Denies history of Type I Diabetes Genitourinary Denies history of End Stage Renal Disease Integumentary (Skin) Denies history of History of Burn Musculoskeletal Patient has history of Osteoarthritis Denies history of Gout, Rheumatoid Arthritis, Osteomyelitis Neurologic Patient has history of Dementia Denies history of Neuropathy, Quadriplegia, Paraplegia,  Seizure Disorder Oncologic Denies history of Received Chemotherapy, Received Radiation Psychiatric Denies history of Anorexia/bulimia, Confinement Anxiety Hospitalization/Surgery History - left nephrectomy. - hysterectomy. - right knee replacement. - bil shoulder rotator  cuff repair. - tonsillectomy. - appendectomy. - cholecystectomy. Medical A Surgical History Notes nd Cardiovascular hyperlipidemia Endocrine hypothyroidism Genitourinary hx renal cancer, CKD Neurologic hx CVA Oncologic renal cancer Objective Constitutional respirations regular, non-labored and within target range for patient.. Vitals Time Taken: 10:04 AM, Height: 62 in, Weight: 173 lbs, BMI: 31.6, Temperature: 97.5 F, Pulse: 76 bpm, Respiratory Rate: 18 breaths/min, Blood Pressure: 155/79 mmHg. Cardiovascular 2+ dorsalis pedis/posterior tibialis pulses. Psychiatric pleasant and cooperative. General Notes: Left heel: Necrotic black eschar present. Integumentary (Hair, Skin) Wound #1 status is Open. Original cause of wound was Pressure Injury. The date acquired was: 09/21/2020. The wound has been in treatment 6 weeks. The wound is located on the Left Calcaneus. The wound measures 1.7cm length x 2.7cm width x 0.1cm depth; 3.605cm^2 area and 0.36cm^3 volume. There is Fat Layer (Subcutaneous Tissue) exposed. There is no tunneling or undermining noted. There is a small amount of serous drainage noted. The wound margin is distinct with the outline attached to the wound base. There is no granulation within the wound bed. There is a large (67-100%) amount of necrotic tissue within the wound bed including Eschar. Wound #2 status is Open. Original cause of wound was Pressure Injury. The date acquired was: 10/19/2020. The wound has been in treatment 6 weeks. The wound is located on the Left,Medial Lower Leg. The wound measures 0.2cm length x 0.2cm width x 0.1cm depth; 0.031cm^2 area and 0.003cm^3 volume. There is Fat Layer  (Subcutaneous Tissue) exposed. There is no tunneling noted. There is a small amount of serosanguineous drainage noted. The wound margin is flat and intact. There is medium (34-66%) red granulation within the wound bed. There is a small (1-33%) amount of necrotic tissue within the wound bed including Eschar. Assessment Active Problems ICD-10 Pressure ulcer of left heel, unstageable Venous insufficiency (chronic) (peripheral) Type 2 diabetes mellitus with foot ulcer Non-pressure chronic ulcer of other part of left lower leg with fat layer exposed On 4/13 patient had left lower extremity angiogram with left above-knee popliteal artery stenting by Dr. Stanford Breed. Patient is currently on dual antiplatelet therapy. With improvement in blood flow will attempt to symptomatically debride the eschar. I crosshatched the area today and recommended using Santyl daily. Daughter and patient was agreeable with the plan. Patient's daughter would also like a referral to podiatry for toenail clipping Procedures Wound #1 Pre-procedure diagnosis of Wound #1 is a Pressure Ulcer located on the Left Calcaneus .Severity of Tissue Pre Debridement is: Limited to breakdown of skin. There was a Chemical/Enzymatic/Mechanical debridement performed by Dorothy Gouty, RN.Marland Kitchen Agent used was [employer]. There was no bleeding. The procedure was tolerated well. Post Debridement Measurements: 1.7cm length x 2.7cm width x 0.1cm depth; 0.36cm^3 volume. Post debridement Stage noted as Unstageable/Unclassified. Character of Wound/Ulcer Post Debridement requires further debridement. Severity of Tissue Post Debridement is: Limited to breakdown of skin. Post procedure Diagnosis Wound #1: Same as Pre-Procedure Plan Follow-up Appointments: Return Appointment in 2 weeks. - Tuesday Bathing/ Shower/ Hygiene: May shower with protection but do not get wound dressing(s) wet. - patient may shower with cast protector with normal showering schedule.  patient may wash with soap and water wound, leg, and foot with dressing changes. Edema Control - Lymphedema / SCD / Other: Elevate legs to the level of the heart or above for 30 minutes daily and/or when sitting, a frequency of: Avoid standing for long periods of time. Moisturize legs daily. Off-Loading: Other: - offloading shoe for left heel when pt. is up and walking.  Pt. may wear pressure-relieving boots at bedtime. Patient may walk with PT as long as patient is wearing offloading shoe. Home Health: New wound care orders this week; continue Home Health for wound care. May utilize formulary equivalent dressing for wound treatment orders unless otherwise specified. [facility] home health three times a week. Every other Tuesday at wound center and all other days daughter to change. Consults ordered were: Podiatry- [facility] - Referral to [facility] for nail trimming. The following medication(s) was prescribed: Santyl topical 250 unit/gram ointment 1 apply to the black eschar once daily 1. Crosshatched black eschar with Santyl daily 2. Follow-up in 2 weeks 3. Podiatry referral for nail clipping Electronic Signature(s) Signed: 12/09/2020 11:29:36 AM By: Dorothy Shan DO Previous Signature: 12/09/2020 11:15:13 AM Version By: Dorothy Shan DO Entered By: Dorothy Barker on 12/09/2020 11:29:17 -------------------------------------------------------------------------------- HxROS Details Patient Name: Date of Service: Dorothy Barker 12/09/2020 10:00 A M Medical Record Number: [id number] Patient Account Number: [id number] Date of Birth/Sex: Treating RN: 1940/10/28 (81 y.o. Dorothy Barker Primary Care Provider: MO Dorothy Barker, Barker Other Clinician: Referring Provider: Treating Provider/Extender: Dorothy Barker Dorothy RGA N, Barker Weeks in Treatment: 6 Information Obtained From Patient Eyes Medical History: Positive for: Cataracts Negative for: Glaucoma; Optic  Neuritis Cardiovascular Medical History: Positive for: Hypertension Past Medical History Notes: hyperlipidemia Endocrine Medical History: Positive for: Type II Diabetes Negative for: Type I Diabetes Past Medical History Notes: hypothyroidism Time with diabetes: 10 yrs Treated with: Insulin Blood sugar tested every day: Yes Tested : daily Genitourinary Medical History: Negative for: End Stage Renal Disease Past Medical History Notes: hx renal cancer, CKD Integumentary (Skin) Medical History: Negative for: History of Burn Musculoskeletal Medical History: Positive for: Osteoarthritis Negative for: Gout; Rheumatoid Arthritis; Osteomyelitis Neurologic Medical History: Positive for: Dementia Negative for: Neuropathy; Quadriplegia; Paraplegia; Seizure Disorder Past Medical History Notes: hx CVA Oncologic Medical History: Negative for: Received Chemotherapy; Received Radiation Past Medical History Notes: renal cancer Psychiatric Medical History: Negative for: Anorexia/bulimia; Confinement Anxiety HBO Extended History Items Eyes: Cataracts Immunizations Pneumococcal Vaccine: Received Pneumococcal Vaccination: No Implantable Devices No devices added Hospitalization / Surgery History Type of Hospitalization/Surgery left nephrectomy hysterectomy right knee replacement bil shoulder rotator cuff repair tonsillectomy appendectomy cholecystectomy Family and Social History Cancer: Yes - Mother,Maternal Grandparents; Diabetes: Yes - Child; Heart Disease: No; Hereditary Spherocytosis: No; Hypertension: Yes - Father; Kidney Disease: No; Lung Disease: No; Seizures: No; Stroke: Yes - Mother,Father; Thyroid Problems: No; Tuberculosis: No; Never smoker; Marital Status - Divorced; Alcohol Use: Never; Drug Use: No History; Caffeine Use: Daily; Financial Concerns: No; Food, Clothing or Shelter Needs: No; Support System Lacking: No; Transportation Concerns: No Electronic  Signature(s) Signed: 12/09/2020 11:15:13 AM By: Dorothy Shan DO Signed: 12/09/2020 5:27:07 PM By: Dorothy Barker Entered By: Dorothy Barker on 12/09/2020 11:01:40 -------------------------------------------------------------------------------- SuperBill Details Patient Name: Date of Service: Dorothy Barker 12/09/2020 Medical Record Number: [id number] Patient Account Number: [id number] Date of Birth/Sex: Treating RN: 1941/02/16 (79 y.o. Helene Shoe, Meta.Reding Primary Care Provider: MO RGA N, Barker Other Clinician: Referring Provider: Treating Provider/Extender: Dorothy Barker Dorothy RGA N, Barker Weeks in Treatment: 6 Diagnosis Coding ICD-10 Codes Code Description L89.620 Pressure ulcer of left heel, unstageable I87.2 Venous insufficiency (chronic) (peripheral) E11.621 Type 2 diabetes mellitus with foot ulcer L97.822 Non-pressure chronic ulcer of other part of left lower leg with fat layer exposed Facility Procedures CPT4 Code: [id number] Description: 22979 - [facility] W/O ANES NON SELECT Modifier: Quantity: 1 Electronic Signature(s) Signed: 12/09/2020  11:15:13 AM By: Dorothy Shan DO Entered By: Dorothy Barker on 12/09/2020 11:14:44

## 2020-12-14 NOTE — Progress Notes (Signed)
Dorothy Barker, Dorothy Barker ([id number]) Visit Report for 12/09/2020 Arrival Information Details Patient Name: Date of Service: Dorothy Barker, Dorothy Barker 12/09/2020 10:00 A M Medical Record Number: [id number] Patient Account Number: [id number] Date of Birth/Sex: Treating RN: [birth year] (80 y.o. Dorothy Barker Primary Care Kaileah Shevchenko: MO Geanie Cooley, RUFFIN Other Clinician: Referring Cherre Kothari: Treating Jemima Petko/Extender: Kalman Shan MO RGA N, RUFFIN Weeks in Treatment: 6 Visit Information History Since Last Visit Added or deleted any medications: No Patient Arrived: Wheel Chair Any new allergies or adverse reactions: No Arrival Time: 09:57 Had a fall or experienced change in No Accompanied By: Daughter activities of daily living that may affect Transfer Assistance: Manual risk of falls: Patient Identification Verified: Yes Signs or symptoms of abuse/neglect since last visito No Secondary Verification Process Completed: Yes Hospitalized since last visit: No Patient Requires Transmission-Based Precautions: No Implantable device outside of the clinic excluding No Patient Has Alerts: Yes cellular tissue based products placed in the center Patient Alerts: Patient on Blood Thinner since last visit: Has Dressing in Place as Prescribed: Yes Has Footwear/Offloading in Place as Prescribed: Yes Left: Wedge Shoe Pain Present Now: Yes Electronic Signature(s) Signed: 12/09/2020 5:08:10 PM By: Lorrin Jackson Entered By: Lorrin Jackson on 12/09/2020 10:13:04 -------------------------------------------------------------------------------- Encounter Discharge Information Details Patient Name: Date of Service: Dorothy Barker 12/09/2020 10:00 [place] Record Number: [id number] Patient Account Number: [id number] Date of Birth/Sex: Treating RN: 04/19/1941 (80 y.o. Elam Dutch Primary Care Tilly Pernice: MO Geanie Cooley, RUFFIN Other Clinician: Referring Shadee Montoya: Treating Reshanda Lewey/Extender: Kalman Shan MO RGA N,  RUFFIN Weeks in Treatment: 6 Encounter Discharge Information Items Discharge Condition: Stable Ambulatory Status: Wheelchair Discharge Destination: Home Transportation: Private Auto Accompanied By: daughter Schedule Follow-up Appointment: Yes Clinical Summary of Care: Patient Declined Electronic Signature(s) Signed: 12/10/2020 5:10:31 PM By: Baruch Gouty RN, BSN Entered By: Baruch Gouty on 12/09/2020 10:58:00 -------------------------------------------------------------------------------- Lower Extremity Assessment Details Patient Name: Date of Service: Dorothy Barker 12/09/2020 10:00 [place] Record Number: [id number] Patient Account Number: [id number] Date of Birth/Sex: Treating RN: Sep 25, 1940 (80 y.o. Dorothy Barker Primary Care Farmer Mccahill: MO Geanie Cooley, RUFFIN Other Clinician: Referring Sandy Blouch: Treating Jeovani Weisenburger/Extender: Kalman Shan MO RGA N, RUFFIN Weeks in Treatment: 6 Edema Assessment Assessed: [Left: Yes] [Right: No] Edema: [Left: Ye] [Right: s] Calf Left: Right: Point of Measurement: 28 cm From Medial Instep 34 cm Ankle Left: Right: Point of Measurement: 8 cm From Medial Instep 24 cm Vascular Assessment Pulses: Dorsalis Pedis Palpable: [Left:Yes] Electronic Signature(s) Signed: 12/09/2020 5:08:10 PM By: Lorrin Jackson Entered By: Lorrin Jackson on 12/09/2020 10:07:20 -------------------------------------------------------------------------------- Multi Wound Chart Details Patient Name: Date of Service: Dorothy Barker 12/09/2020 10:00 A M Medical Record Number: [id number] Patient Account Number: [id number] Date of Birth/Sex: Treating RN: 1941/08/17 (80 y.o. Dorothy Barker Primary Care Vallarie Fei: MO Geanie Cooley, RUFFIN Other Clinician: Referring Frans Valente: Treating Amonte Brookover/Extender: Kalman Shan MO RGA N, RUFFIN Weeks in Treatment: 6 Vital Signs Height(in): 58 Pulse(bpm): 64 Weight(lbs): 173 Blood Pressure(mmHg): 155/79 Body Mass  Index(BMI): 32 Temperature(F): 97.5 Respiratory Rate(breaths/min): 18 Photos: [1:No Photos Left Calcaneus] [2:No Photos Left, Medial Lower Leg] [N/A:N/A N/A] Wound Location: [1:Pressure Injury] [2:Pressure Injury] [N/A:N/A] Wounding Event: [1:Pressure Ulcer] [2:Pressure Ulcer] [N/A:N/A] Primary Etiology: [1:Diabetic Wound/Ulcer of the Lower] [2:N/A] [N/A:N/A] Secondary Etiology: [1:Extremity Cataracts, Hypertension, Type II] [2:Cataracts, Hypertension, Type II] [N/A:N/A] Comorbid History: [1:Diabetes, Osteoarthritis, Dementia 09/21/2020] [2:Diabetes, Osteoarthritis, Dementia 10/19/2020] [N/A:N/A] Date Acquired: [1:6] [2:6] [N/A:N/A] Weeks of Treatment: [1:Open] [2:Open] [N/A:N/A] Wound Status: [1:1.7x2.7x0.1] [2:0.2x0.2x0.1] [N/A:N/A] Measurements L x W x  D (cm) [1:3.605] [2:0.031] [N/A:N/A] A (cm) : rea [1:0.36] [2:0.003] [N/A:N/A] Volume (cm) : [1:8.90%] [2:86.90%] [N/A:N/A] % Reduction in A rea: [1:9.10%] [2:87.50%] [N/A:N/A] % Reduction in Volume: [1:Unstageable/Unclassified] [2:Unstageable/Unclassified] [N/A:N/A] Classification: [1:Small] [2:Small] [N/A:N/A] Exudate A mount: [1:Serous] [2:Serosanguineous] [N/A:N/A] Exudate Type: [1:amber] [2:red, brown] [N/A:N/A] Exudate Color: [1:Distinct, outline attached] [2:Flat and Intact] [N/A:N/A] Wound Margin: [1:None Present (0%)] [2:Medium (34-66%)] [N/A:N/A] Granulation A mount: [1:N/A] [2:Red] [N/A:N/A] Granulation Quality: [1:Large (67-100%)] [2:Small (1-33%)] [N/A:N/A] Necrotic A mount: [1:Eschar] [2:Eschar] [N/A:N/A] Necrotic Tissue: [1:Fat Layer (Subcutaneous Tissue): Yes Fat Layer (Subcutaneous Tissue): Yes N/A] Exposed Structures: [1:Fascia: No Tendon: No Muscle: No Joint: No Bone: No Small (1-33%)] [2:Fascia: No Tendon: No Muscle: No Joint: No Bone: No Medium (34-66%)] [N/A:N/A] Treatment Notes Wound #1 (Calcaneus) Wound Laterality: Left Cleanser Soap and Water Discharge Instruction: May shower and wash wound with dial  antibacterial soap and water prior to dressing change. Wound Cleanser Discharge Instruction: Cleanse the wound with wound cleanser prior to applying a clean dressing using gauze sponges, not tissue or cotton balls. Peri-Wound Care Topical Primary Dressing Santyl Ointment Discharge Instruction: Apply nickel thick amount to wound bed as instructed Secondary Dressing Woven Gauze Sponge, Non-Sterile 4x4 in Discharge Instruction: Apply over primary dressing as directed. ABD Pad, 5x9 Discharge Instruction: Apply over primary dressing as directed. Secured With [employer], 4.5x3.1 (in/yd) Discharge Instruction: Secure with Kerlix as directed. 33M Medipore H Soft Cloth Surgical T 4 x 2 (in/yd) ape Discharge Instruction: Secure dressing with tape as directed. Compression Wrap Compression Stockings Add-Ons Wound #2 (Lower Leg) Wound Laterality: Left, Medial Cleanser Peri-Wound Care Topical Primary Dressing leave open to air. Discharge Instruction: Leave open to air. Secondary Dressing Secured With Compression Wrap Compression Stockings Add-Ons Electronic Signature(s) Signed: 12/09/2020 11:15:13 AM By: Kalman Shan DO Signed: 12/09/2020 5:27:07 PM By: Deon Pilling Entered By: Kalman Shan on 12/09/2020 10:58:16 -------------------------------------------------------------------------------- Multi-Disciplinary Care Plan Details Patient Name: Date of Service: Dorothy Barker 12/09/2020 10:00 A M Medical Record Number: [id number] Patient Account Number: [id number] Date of Birth/Sex: Treating RN: 07/02/1941 (80 y.o. Dorothy Barker Primary Care Delilah Mulgrew: MO Geanie Cooley, RUFFIN Other Clinician: Referring Taquana Bartley: Treating Katelee Schupp/Extender: Kalman Shan MO RGA N, RUFFIN Weeks in Treatment: 6 Active Inactive Wound/Skin Impairment Nursing Diagnoses: Impaired tissue integrity Knowledge deficit related to ulceration/compromised skin integrity Goals: Patient will have  a decrease in wound volume by X% from date: (specify in notes) Date Initiated: 10/26/2020 Target Resolution Date: 12/17/2020 Goal Status: Active Patient/caregiver will verbalize understanding of skin care regimen Date Initiated: 10/26/2020 Target Resolution Date: 12/17/2020 Goal Status: Active Ulcer/skin breakdown will have a volume reduction of 30% by week 4 Date Initiated: 10/26/2020 Date Inactivated: 12/09/2020 Target Resolution Date: 12/09/2020 Goal Status: Unmet Unmet Reason: no major changes. Interventions: Assess patient/caregiver ability to obtain necessary supplies Assess patient/caregiver ability to perform ulcer/skin care regimen upon admission and as needed Assess ulceration(s) every visit Notes: Electronic Signature(s) Signed: 12/09/2020 5:27:07 PM By: Deon Pilling Entered By: Deon Pilling on 12/09/2020 10:07:14 -------------------------------------------------------------------------------- Pain Assessment Details Patient Name: Date of Service: Dorothy Barker, Dorothy Barker 12/09/2020 10:00 [place] Record Number: [id number] Patient Account Number: [id number] Date of Birth/Sex: Treating RN: August 15, 1941 (80 y.o. Dorothy Barker Primary Care [place] Festa: MO Geanie Cooley, RUFFIN Other Clinician: Referring Legrande Hao: Treating Marvine Encalade/Extender: Kalman Shan MO RGA N, RUFFIN Weeks in Treatment: 6 Active Problems Location of Pain Severity and Description of Pain Patient Has Paino Yes Site Locations Pain Location: Pain in Ulcers With Dressing Change: Yes Duration of the  Pain. Constant / Intermittento Intermittent Rate the pain. Current Pain Level: 3 Character of Pain Describe the Pain: Aching, Tender Pain Management and Medication Current Pain Management: Medication: No Cold Application: No Rest: Yes Massage: No Activity: No T.E.N.S.: No Heat Application: No Leg drop or elevation: No Is the Current Pain Management Adequate: Inadequate How does your wound impact your activities  of daily livingo Sleep: No Bathing: No Appetite: No Relationship With Others: No Bladder Continence: No Emotions: No Bowel Continence: No Work: No Toileting: No Drive: No Dressing: No Hobbies: No Electronic Signature(s) Signed: 12/09/2020 5:08:10 PM By: Lorrin Jackson Entered By: Lorrin Jackson on 12/09/2020 10:05:14 -------------------------------------------------------------------------------- Patient/Caregiver Education Details Patient Name: Date of Service: Dorothy Barker, Dorothy Y B. 4/21/2022andnbsp10:00 [place] Record Number: [id number] Patient Account Number: [id number] Date of Birth/Gender: Treating RN: 08-04-1941 (80 y.o. Dorothy Barker Primary Care Physician: MO Geanie Cooley, RUFFIN Other Clinician: Referring Physician: Treating Physician/Extender: Kalman Shan MO RGA N, RUFFIN Weeks in Treatment: 6 Education Assessment Education Provided To: Patient Education Topics Provided Wound/Skin Impairment: Handouts: Skin Care Do's and Dont's Methods: Explain/Verbal Responses: Reinforcements needed Electronic Signature(s) Signed: 12/09/2020 5:27:07 PM By: Deon Pilling Entered By: Deon Pilling on 12/09/2020 10:07:27 -------------------------------------------------------------------------------- Wound Assessment Details Patient Name: Date of Service: Dorothy Barker 12/09/2020 10:00 [place] Record Number: [id number] Patient Account Number: [id number] Date of Birth/Sex: Treating RN: 11/13/1940 (80 y.o. Dorothy Barker Primary Care Deseri Loss: MO RGA N, RUFFIN Other Clinician: Referring Lexander Tremblay: Treating Corderius Saraceni/Extender: Kalman Shan MO RGA N, RUFFIN Weeks in Treatment: 6 Wound Status Wound Number: 1 Primary Etiology: Pressure Ulcer Wound Location: Left Calcaneus Secondary Diabetic Wound/Ulcer of the Lower Extremity Etiology: Wounding Event: Pressure Injury Wound Status: Open Date Acquired: 09/21/2020 Comorbid Cataracts, Hypertension, Type II Diabetes,  Osteoarthritis, Weeks Of Treatment: 6 History: Dementia Clustered Wound: No Photos Wound Measurements Length: (cm) 1.7 Width: (cm) 2.7 Depth: (cm) 0.1 Area: (cm) 3.605 Volume: (cm) 0.36 % Reduction in Area: 8.9% % Reduction in Volume: 9.1% Epithelialization: Small (1-33%) Tunneling: No Undermining: No Wound Description Classification: Unstageable/Unclassified Wound Margin: Distinct, outline attached Exudate Amount: Small Exudate Type: Serous Exudate Color: amber Foul Odor After Cleansing: No Slough/Fibrino No Wound Bed Granulation Amount: None Present (0%) Exposed Structure Necrotic Amount: Large (67-100%) Fascia Exposed: No Necrotic Quality: Eschar Fat Layer (Subcutaneous Tissue) Exposed: Yes Tendon Exposed: No Muscle Exposed: No Joint Exposed: No Bone Exposed: No Treatment Notes Wound #1 (Calcaneus) Wound Laterality: Left Cleanser Soap and Water Discharge Instruction: May shower and wash wound with dial antibacterial soap and water prior to dressing change. Wound Cleanser Discharge Instruction: Cleanse the wound with wound cleanser prior to applying a clean dressing using gauze sponges, not tissue or cotton balls. Peri-Wound Care Topical Primary Dressing Santyl Ointment Discharge Instruction: Apply nickel thick amount to wound bed as instructed Secondary Dressing Woven Gauze Sponge, Non-Sterile 4x4 in Discharge Instruction: Apply over primary dressing as directed. ABD Pad, 5x9 Discharge Instruction: Apply over primary dressing as directed. Secured With [employer], 4.5x3.1 (in/yd) Discharge Instruction: Secure with Kerlix as directed. 47M Medipore H Soft Cloth Surgical T 4 x 2 (in/yd) ape Discharge Instruction: Secure dressing with tape as directed. Compression Wrap Compression Stockings Add-Ons Electronic Signature(s) Signed: 12/09/2020 5:08:10 PM By: Lorrin Jackson Signed: 12/14/2020 2:09:33 PM By: Sandre Kitty Entered By: Sandre Kitty  on 12/09/2020 16:44:48 -------------------------------------------------------------------------------- Wound Assessment Details Patient Name: Date of Service: Dorothy Barker 12/09/2020 10:00 A M Medical Record Number: [id number] Patient Account Number: [id number] Date of Birth/Sex: Treating  RN: [birth year] (80 y.o. Dorothy Barker Primary Care Haziel Molner: MO RGA N, RUFFIN Other Clinician: Referring Jafari Mckillop: Treating Toshie Demelo/Extender: Kalman Shan MO RGA N, RUFFIN Weeks in Treatment: 6 Wound Status Wound Number: 2 Primary Pressure Ulcer Etiology: Wound Location: Left, Medial Lower Leg Wound Status: Open Wounding Event: Pressure Injury Comorbid Cataracts, Hypertension, Type II Diabetes, Osteoarthritis, Date Acquired: 10/19/2020 History: Dementia Weeks Of Treatment: 6 Clustered Wound: No Photos Wound Measurements Length: (cm) 0.2 Width: (cm) 0.2 Depth: (cm) 0.1 Area: (cm) 0.031 Volume: (cm) 0.003 % Reduction in Area: 86.9% % Reduction in Volume: 87.5% Epithelialization: Medium (34-66%) Tunneling: No Wound Description Classification: Unstageable/Unclassified Wound Margin: Flat and Intact Exudate Amount: Small Exudate Type: Serosanguineous Exudate Color: red, brown Foul Odor After Cleansing: No Slough/Fibrino No Wound Bed Granulation Amount: Medium (34-66%) Exposed Structure Granulation Quality: Red Fascia Exposed: No Necrotic Amount: Small (1-33%) Fat Layer (Subcutaneous Tissue) Exposed: Yes Necrotic Quality: Eschar Tendon Exposed: No Muscle Exposed: No Joint Exposed: No Bone Exposed: No Treatment Notes Wound #2 (Lower Leg) Wound Laterality: Left, Medial Cleanser Peri-Wound Care Topical Primary Dressing leave open to air. Discharge Instruction: Leave open to air. Secondary Dressing Secured With Compression Wrap Compression Stockings Add-Ons Electronic Signature(s) Signed: 12/09/2020 5:08:10 PM By: Lorrin Jackson Signed: 12/14/2020 2:09:33 PM By:  Sandre Kitty Entered By: Sandre Kitty on 12/09/2020 16:45:05 -------------------------------------------------------------------------------- Vitals Details Patient Name: Date of Service: Dorothy Barker 12/09/2020 10:00 A M Medical Record Number: [id number] Patient Account Number: [id number] Date of Birth/Sex: Treating RN: [birth year] (80 y.o. Dorothy Barker Primary Care Gilman Olazabal: MO RGA N, RUFFIN Other Clinician: Referring Hyun Reali: Treating Tekisha Darcey/Extender: Kalman Shan MO RGA N, RUFFIN Weeks in Treatment: 6 Vital Signs Time Taken: 10:04 Temperature (F): 97.5 Height (in): 62 Pulse (bpm): 76 Weight (lbs): 173 Respiratory Rate (breaths/min): 18 Body Mass Index (BMI): 31.6 Blood Pressure (mmHg): 155/79 Reference Range: 80 - 120 mg / dl Electronic Signature(s) Signed: 12/09/2020 5:08:10 PM By: Lorrin Jackson Entered By: Lorrin Jackson on 12/09/2020 10:04:34

## 2020-12-16 ENCOUNTER — Encounter: Payer: Self-pay | Admitting: Neurology

## 2020-12-16 ENCOUNTER — Ambulatory Visit: Payer: Medicare PPO | Admitting: Neurology

## 2020-12-16 ENCOUNTER — Ambulatory Visit (INDEPENDENT_AMBULATORY_CARE_PROVIDER_SITE_OTHER): Payer: Medicare PPO | Admitting: Podiatry

## 2020-12-16 ENCOUNTER — Other Ambulatory Visit: Payer: Self-pay

## 2020-12-16 VITALS — BP 186/90 | HR 78 | Ht 60.0 in | Wt 174.0 lb

## 2020-12-16 DIAGNOSIS — I63133 Cerebral infarction due to embolism of bilateral carotid arteries: Secondary | ICD-10-CM | POA: Diagnosis not present

## 2020-12-16 DIAGNOSIS — B351 Tinea unguium: Secondary | ICD-10-CM | POA: Diagnosis not present

## 2020-12-16 DIAGNOSIS — M79675 Pain in left toe(s): Secondary | ICD-10-CM

## 2020-12-16 DIAGNOSIS — M79674 Pain in right toe(s): Secondary | ICD-10-CM

## 2020-12-16 NOTE — Progress Notes (Signed)
Reason for visit: Cerebrovascular disease  Referring physician: [place]  Dorothy Barker is a 80 y.o. female  History of present illness:  Dorothy Barker is an 80 year old right-handed white female with a history of diabetes, hypertension, and dyslipidemia.  The patient has peripheral vascular disease and required a stent for the left leg.  The patient has a healing ulcer on the heel of the left foot.  The patient was admitted to the hospital at [facility] around 14 September 2020 with bihemispheric strokes and cerebellar stroke.  The patient felt to have an embolic event.  No definite source of the stroke is been found.  Apparently, the patient did have a 2-week cardiac monitor done following that admission, the results of this are not available to me.  The patient has been taking aspirin and Plavix combination after an emergency room visit on 27 November 2020 when she presented with sudden onset of right hand numbness and clumsiness.  This has not improved since that time.  The patient had a CT scan of the brain that did not show any acute changes, but MRI was never done.  The patient denies any change in vision, speech or swallowing.  She has no other sensation changes or strength changes.  She denies issues controlling the bowels or the bladder.  She comes in the office today with her daughter.  The patient is residing in an extended care facility.  Past Medical History:  Diagnosis Date  . Cancer ([place])    kidney  . Chronic kidney disease   . Diabetes mellitus without complication ([place])   . Hyperlipidemia   . Hypertension   . Stroke ([place])   . Thyroid disease   . Vitamin D deficiency     Past Surgical History:  Procedure Laterality Date  . ABDOMINAL AORTOGRAM W/LOWER EXTREMITY N/A 12/01/2020   Procedure: ABDOMINAL AORTOGRAM W/LOWER EXTREMITY;  Surgeon: Cherre Robins, MD;  Location: [place] [place] [facility];  Service: Cardiovascular;  Laterality: N/A;  . ABDOMINAL HYSTERECTOMY    . KNEE SURGERY  Right   . NEPHRECTOMY Left   . PERIPHERAL VASCULAR INTERVENTION Left 12/01/2020   Procedure: PERIPHERAL VASCULAR INTERVENTION;  Surgeon: Cherre Robins, MD;  Location: [place] [facility];  Service: Cardiovascular;  Laterality: Left;  . TONSILLECTOMY      History reviewed. No pertinent family history.  Social history:  reports that she is a non-smoker but has been exposed to tobacco smoke. She has never used smokeless tobacco. She reports that she does not drink alcohol and does not use drugs.  Medications:  Prior to Admission medications   Medication Sig Start Date End Date Taking? Authorizing Provider  acetaminophen (TYLENOL) 500 MG tablet Take 1,000 mg by mouth every 8 (eight) hours.   Yes [provider]  aspirin 81 MG EC tablet Take 81 mg by mouth every morning. 09/21/20  Yes [provider]  atorvastatin (LIPITOR) 40 MG tablet Take 40 mg by mouth at bedtime. 10/11/20  Yes [provider]  carvedilol (COREG) 6.25 MG tablet Take 6.25 mg by mouth 2 (two) times daily with a meal. 09/21/20  Yes [provider]  cephALEXin (KEFLEX) 250 MG capsule Take 1 capsule (250 mg total) by mouth 4 (four) times daily. 11/27/20  Yes Daleen Bo, MD  clopidogrel (PLAVIX) 75 MG tablet Take 1 tablet (75 mg total) by mouth daily. 12/02/20  Yes Cherre Robins, MD  Dulaglutide (TRULICITY) 2.20 UR/4.2HC SOPN Inject 0.75 mg into the  skin every Thursday. 09/21/20  Yes [provider]  ergocalciferol (VITAMIN D2) 1.25 MG (50000 UT) capsule Take 50,000 Units by mouth every Thursday. 09/21/20  Yes [provider]  insulin aspart (NOVOLOG FLEXPEN) 100 UNIT/ML FlexPen Inject 2-4 Units into the skin See admin instructions. Inject 2-4 units subcutaneously before breakfast and supper: CBG 151-200 2 units, >200 4 units   Yes [provider]  insulin glargine (LANTUS) 100 unit/mL SOPN Inject 16 Units into the skin at bedtime.   Yes [provider]   lisinopril-hydrochlorothiazide (ZESTORETIC) 20-12.5 MG tablet Take 2 tablets by mouth every morning. 09/21/20  Yes [provider]  thyroid (ARMOUR) 60 MG tablet Take 60 mg by mouth every morning.   Yes [provider]      Allergies  Allergen Reactions  . Tramadol Other (See Comments)    Syncopal episodes   . Amlodipine Besy-Benazepril Hcl Rash  . Codeine Rash  . Naproxen Rash    ROS:  Out of a complete 14 system review of symptoms, the patient complains only of the following symptoms, and all other reviewed systems are negative.  Right hand numbness Walking difficulty Left foot ulcer  Blood pressure (!) 186/90, pulse 78, height 5' (1.524 m), weight 174 lb (78.9 kg).  Physical Exam  General: The patient is alert and cooperative at the time of the examination.  Eyes: Pupils are equal, round, and reactive to light. Discs are flat bilaterally.  Neck: The neck is supple, no carotid bruits are noted.  Respiratory: The respiratory examination is clear.  Cardiovascular: The cardiovascular examination reveals a regular rate and rhythm, no obvious murmurs or rubs are noted.  Skin: Extremities are without significant edema.  The patient has bandage around the left foot.  Neurologic Exam  Mental status: The patient is alert and oriented x 3 at the time of the examination. The patient has apparent normal recent and remote memory, with an apparently normal attention span and concentration ability.  Cranial nerves: Facial symmetry is present. There is good sensation of the face to pinprick and soft touch bilaterally. The strength of the facial muscles and the muscles to head turning and shoulder shrug are normal bilaterally. Speech is well enunciated, no aphasia or dysarthria is noted. Extraocular movements are full. Visual fields are full. The tongue is midline, and the patient has symmetric elevation of the soft palate. No obvious hearing deficits are noted.  Motor:  The motor testing reveals 5 over 5 strength of all 4 extremities. Good symmetric motor tone is noted throughout.  Sensory: Sensory testing is intact to pinprick, soft touch, vibration sensation, and position sense on all 4 extremities, with exception some decreased pinprick sensation on the right hand and arm, decreased vibration sensation in both feet. No evidence of extinction is noted.  Coordination: Cerebellar testing reveals mild dysmetria with finger-nose-finger and heel-to-shin on the right arm and right leg as compared to the left.  Gait and station: Gait could not be tested, the patient is unable to bear weight on the left foot, she has a healing ischemic ulcer.  Reflexes: Deep tendon reflexes are symmetric, but are depressed bilaterally. Toes are downgoing bilaterally.   2D echo 09/15/20:  ECHO: ECHO results from current admission-last 7 days  Procedure Date/Time  CUS TTE SURFACE COMPLETE ECHO ADULT [[id number]] Collected: 09/15/20 1355  Order Status: Completed Updated: 09/15/20 1501  Left Ventricular EF 55 %  SUMMARY The left ventricular size is normal. Mild left ventricular hypertrophy Left ventricular systolic  function is normal. LV ejection fraction = 55-60%. The right ventricle is normal in size and function. Injection of agitated saline showed no right-to-left shunt. There is mild aortic regurgitation. There is mild tricuspid regurgitation. No significant stenosis seen Estimated right ventricular systolic pressure is 37 mmHg. Estimated right atrial pressure is 5 mmHg.. Mild pulmonary hypertension. There is no pericardial effusion. There is no comparison study available.    MRA head and neck 09/15/20:  Impression 1. High-grade focal stenosis of the bilateral proximal P2 segments of the posterior cerebral arteries. No evidence of vessel cutoff.  2. No significant stenosis, large vessel occlusion, or aneurysm identified in the anterior circulation. Apparent  stenoses of the left M2 superior division and right M2 inferior division on the time-of-flight imaging that appear to nearly resolve on the post contrast imaging. CTA of the head can further evaluate as clinically indicated.  3. No significant stenosis or occlusion identified in the arteries of the neck.     MRI brain 09/15/20:  Impression  1. Acute/early subacute infarcts involving the right caudate and putamen and medial left temporal lobe. Findings compatible with petechial hemorrhage within the right caudate/putamen infarcts. The distribution of infarcts suggests a possible central embolic etiology.  2. Findings compatible with an early subacute infarct involving the right inferior cerebellum. This infarct does not restrict diffusion as intensely suggesting that it may be more subacute compared to the other infarcts, described above.   Assessment/Plan:  1.  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  Peripheral vascular disease  3.  Diabetes  4.  Hypertension  5.  Dyslipidemia  The patient had a recent event of right hand numbness and clumsiness.  The patient may have suffered another stroke event.  The patient is on aspirin Plavix combination, she will stop the aspirin 1 week.  The patient will be set up for a 30-day cardiac monitor study.  She will have MRI of the brain.  She will follow up here in 3 months.   Jill Alexanders MD 12/16/2020 2:38 PM  [facility] [address] [place], [place] [address]  Phone [phone] Fax [phone]

## 2020-12-16 NOTE — Patient Instructions (Signed)
Stop aspirin in one week.

## 2020-12-17 ENCOUNTER — Encounter: Payer: Self-pay | Admitting: *Deleted

## 2020-12-17 ENCOUNTER — Telehealth: Payer: Self-pay | Admitting: Neurology

## 2020-12-17 NOTE — Telephone Encounter (Signed)
Mcarthur Rossetti Josem Kaufmann #[id number] (12/16/20- 01/15/21) TF  Order sent to GI. They will reach out to the patient to schedule.

## 2020-12-17 NOTE — Progress Notes (Signed)
Patient ID: Dorothy Barker, female   DOB: May 08, 1941, 80 y.o.   MRN: [id number] Patient enrolled for [employer] to ship a 30 day cardiac event monitor to [address], [place], [place]  [address]. Letter with instructions mailed to patient.

## 2020-12-18 NOTE — Progress Notes (Signed)
Subjective:   Patient ID: Dorothy Barker, female   DOB: 80 y.o.   MRN: [id number]   HPI 80 year old female presents the office today for concerns of thick, elongated toenails that she cannot trim her self.  She does have a wound on the left ankle that is currently being treated by another provider and she does not want this removed today.  She has no other concerns today.   Review of Systems  All other systems reviewed and are negative.  Past Medical History:  Diagnosis Date  . Cancer ([place])    kidney  . Chronic kidney disease   . Diabetes mellitus without complication ([place])   . Hyperlipidemia   . Hypertension   . Stroke ([place])   . Thyroid disease   . Vitamin D deficiency     Past Surgical History:  Procedure Laterality Date  . ABDOMINAL AORTOGRAM W/LOWER EXTREMITY N/A 12/01/2020   Procedure: ABDOMINAL AORTOGRAM W/LOWER EXTREMITY;  Surgeon: Cherre Robins, MD;  Location: [place] [facility];  Service: Cardiovascular;  Laterality: N/A;  . ABDOMINAL HYSTERECTOMY    . KNEE SURGERY Right   . NEPHRECTOMY Left   . PERIPHERAL VASCULAR INTERVENTION Left 12/01/2020   Procedure: PERIPHERAL VASCULAR INTERVENTION;  Surgeon: Cherre Robins, MD;  Location: [place] [facility];  Service: Cardiovascular;  Laterality: Left;  . TONSILLECTOMY       Current Outpatient Medications:  .  acetaminophen (TYLENOL) 500 MG tablet, Take 1,000 mg by mouth every 8 (eight) hours., Disp: , Rfl:  .  aspirin 81 MG EC tablet, Take 81 mg by mouth every morning., Disp: , Rfl:  .  atorvastatin (LIPITOR) 40 MG tablet, Take 40 mg by mouth at bedtime., Disp: , Rfl:  .  carvedilol (COREG) 6.25 MG tablet, Take 6.25 mg by mouth 2 (two) times daily with a meal., Disp: , Rfl:  .  cephALEXin (KEFLEX) 250 MG capsule, Take 1 capsule (250 mg total) by mouth 4 (four) times daily., Disp: 20 capsule, Rfl: 0 .  clopidogrel (PLAVIX) 75 MG tablet, Take 1 tablet (75 mg total) by mouth daily., Disp: 30 tablet, Rfl: 11 .  Dulaglutide  (TRULICITY) 8.52 DP/8.2UM SOPN, Inject 0.75 mg into the skin every Thursday., Disp: , Rfl:  .  ergocalciferol (VITAMIN D2) 1.25 MG (50000 UT) capsule, Take 50,000 Units by mouth every Thursday., Disp: , Rfl:  .  insulin aspart (NOVOLOG FLEXPEN) 100 UNIT/ML FlexPen, Inject 2-4 Units into the skin See admin instructions. Inject 2-4 units subcutaneously before breakfast and supper: CBG 151-200 2 units, >200 4 units, Disp: , Rfl:  .  insulin glargine (LANTUS) 100 unit/mL SOPN, Inject 16 Units into the skin at bedtime., Disp: , Rfl:  .  lisinopril-hydrochlorothiazide (ZESTORETIC) 20-12.5 MG tablet, Take 2 tablets by mouth every morning., Disp: , Rfl:  .  thyroid (ARMOUR) 60 MG tablet, Take 60 mg by mouth every morning., Disp: , Rfl:   Allergies  Allergen Reactions  . Tramadol Other (See Comments)    Syncopal episodes   . Amlodipine Besy-Benazepril Hcl Rash  . Codeine Rash  . Naproxen Rash         Objective:  Physical Exam  General: AAO x3, NAD  Dermatological: Nails are hypertrophic, dystrophic, brittle, discolored, elongated 10. No surrounding redness or drainage. Tenderness nails 1-5 bilaterally. No open lesions or pre-ulcerative lesions are identified today however she does have bandage on the left side that has a ulcer but will defer to her other provider.   Vascular: Dorsalis  Pedis artery and Posterior Tibial artery pedal pulses are palpable bilateral with immedate capillary fill time. There is no pain with calf compression, swelling, warmth, erythema.   Neruologic: Sensation decreased with SWMF.    Musculoskeletal:  No pain, crepitus, or limitation noted with foot and ankle range of motion bilateral.     Assessment:   Symptomatic onychomycosis      Plan:  -Treatment options discussed including all alternatives, risks, and complications -Etiology of symptoms were discussed -Nails debrided 10 without complications or bleeding. -Daily foot inspection -Follow-up in 3 months  or sooner if any problems arise. In the meantime, encouraged to call the office with any questions, concerns, change in symptoms.   Celesta Gentile, DPM

## 2020-12-21 ENCOUNTER — Encounter (HOSPITAL_BASED_OUTPATIENT_CLINIC_OR_DEPARTMENT_OTHER): Payer: Medicare PPO | Attending: Internal Medicine | Admitting: Internal Medicine

## 2020-12-21 ENCOUNTER — Other Ambulatory Visit: Payer: Self-pay

## 2020-12-21 DIAGNOSIS — Z8673 Personal history of transient ischemic attack (TIA), and cerebral infarction without residual deficits: Secondary | ICD-10-CM | POA: Insufficient documentation

## 2020-12-21 DIAGNOSIS — Z955 Presence of coronary angioplasty implant and graft: Secondary | ICD-10-CM | POA: Diagnosis not present

## 2020-12-21 DIAGNOSIS — E11621 Type 2 diabetes mellitus with foot ulcer: Secondary | ICD-10-CM | POA: Diagnosis not present

## 2020-12-21 DIAGNOSIS — L89620 Pressure ulcer of left heel, unstageable: Secondary | ICD-10-CM | POA: Diagnosis not present

## 2020-12-21 DIAGNOSIS — E1151 Type 2 diabetes mellitus with diabetic peripheral angiopathy without gangrene: Secondary | ICD-10-CM | POA: Insufficient documentation

## 2020-12-21 DIAGNOSIS — L97822 Non-pressure chronic ulcer of other part of left lower leg with fat layer exposed: Secondary | ICD-10-CM | POA: Diagnosis not present

## 2020-12-21 DIAGNOSIS — I872 Venous insufficiency (chronic) (peripheral): Secondary | ICD-10-CM | POA: Diagnosis not present

## 2020-12-21 DIAGNOSIS — I70244 Atherosclerosis of native arteries of left leg with ulceration of heel and midfoot: Secondary | ICD-10-CM

## 2020-12-23 ENCOUNTER — Other Ambulatory Visit: Payer: Medicare PPO

## 2020-12-24 NOTE — Progress Notes (Signed)
QUIN, BLANKINSHIP ([id number]) Visit Report for 12/21/2020 HPI Details Patient Name: Date of Service: Dorothy, Barker 12/21/2020 10:00 A M Medical Record Number: [id number] Patient Account Number: [id number] Date of Birth/Sex: Treating RN: 02-22-1941 (80 y.o. Dorothy Barker, Dorothy Primary Care Provider: MO Dorothy Barker, Barker Other Clinician: Referring Provider: Treating Provider/Extender: Dorothy Barker MO RGA N, Barker Weeks in Treatment: 8 History of Present Illness HPI Description: 10/26/2020 upon evaluation today patient appears to be doing somewhat poorly in regard to wounds on her left heel and left lower leg. The leg is really not nearly as bad as the heel ulcer however. This is an unstageable pressure ulcer unfortunately that occurred when the patient was in the hospital secondary to a stroke. At this point the patient is seen with her daughter as well and both are able to answer and respond to questions. With that being said that the biggest issue right now is that the eschar on the heel is not really stable I think that this is going to need to be addressed with more specific and appropriate dressings currently. Apparently she did see a podiatrist more recently and they did perform some sharp debridement to clear away necrotic tissue here on the heel. T be honest the patient's daughter is not extremely pleased about that she states that they heal was completely dry and stable at that point. With that o being said obviously I did not see anything at that time and I cannot really speak to what the heel look like and then but right now it definitely appears to be much softer and I do not think just Betadine is good to do the job. I think we have to focus on trying to get the eschar off of the heel at this point. The patient is in a assisted living facility currently. With that being said the daughter also questions whether or not she is going require skilled nursing. She does have a history of diabetes  mellitus type 2. She also has a history of chronic venous insufficiency. 3/15; patient admitted to the clinic last week. She has an unstageable pressure ulcer on the left heel that apparently occurred at [facility] when she was recovering from the stroke. She spent some time at [facility] and now is in [facility]. She has home health twice a week we have been using Iodoflex on this area. It was pointed out last week that she does not have palpable pulses below the femoral we ordered arterial studies but her daughter has not heard anything about this we will recheck this. 3/22; unstageable pressure ulcer on the left heel and an area on her left posterior calf. She had her this showed an ABI in the left of 1.15 however monophasic waveforms in the great toe TBI of 0.18 with a pressure of 39. Doppler analysis revealed monophasic waveforms at the CFA which by my way of thinking might suggest proximal disease she had a 50-74 stenosis of the distal SFA with a focal velocity elevation of 357 cm/s. We have been using Iodoflex to both wound areas. The patient does not really complain of a lot of pain but she is not that ambulatory. 3/29 still unstageable area with eschar on the tip of the left heel. This is gradually separating. We have a vascular consult on April 5 4/21; patient was last seen almost 1 month ago. She was seen by Dr. Roselie Barker and had a angiogram of the left  lower extremity with above-the-knee popliteal artery stenting. Patient states she feels well and has no complaints today. 5/3 the patient had an angiogram I have not reviewed this. Per Dr. Jodene Barker last note she had a stent to the left popliteal artery but I do not know the condition of the tibial vessels. In any case her major wound is on the tip of her left heel still with a black eschar we have been using Environmental health practitioner) Signed: 12/21/2020 4:28:01 PM By: Dorothy Ham MD Entered By:  Dorothy Barker on 12/21/2020 11:03:12 -------------------------------------------------------------------------------- Physical Exam Details Patient Name: Date of Service: Dorothy Barker 12/21/2020 10:00 A M Medical Record Number: [id number] Patient Account Number: [id number] Date of Birth/Sex: Treating RN: Nov 02, 1940 (80 y.o. Benjaman Lobe Primary Care Provider: MO Dorothy Barker, Barker Other Clinician: Referring Provider: Treating Provider/Extender: Dorothy Barker MO RGA N, Barker Weeks in Treatment: 8 Constitutional Patient is hypertensive.. Pulse regular and within target range for patient.Marland Kitchen Respirations regular, non-labored and within target range.Marland Kitchen Appears in no distress. Cardiovascular She does now have a palpable left dorsalis pedis pulse although it is not vibrant. I still do not feel a posterior tibial. Notes Weeks agoWound exam left heel black eschar still present. This was crosshatched debrided this. The area is loosening. No evidence of surrounding infection Electronic Signature(s) Signed: 12/21/2020 4:28:01 PM By: Dorothy Ham MD Entered By: Dorothy Barker on 12/21/2020 11:04:11 -------------------------------------------------------------------------------- Physician Orders Details Patient Name: Date of Service: Dorothy Barker 12/21/2020 10:00 A M Medical Record Number: [id number] Patient Account Number: [id number] Date of Birth/Sex: Treating RN: [birth year] (80 y.o. Dorothy Barker, Dorothy Primary Care Provider: MO Dorothy Barker, Barker Other Clinician: Referring Provider: Treating Provider/Extender: Dorothy Barker MO RGA N, Barker Weeks in Treatment: 8 Verbal / Phone Orders: No Diagnosis Coding Follow-up Appointments ppointment in 2 weeks. - Tuesday Return A Bathing/ Shower/ Hygiene May shower with protection but do not get wound dressing(s) wet. - patient may shower with cast protector with normal showering schedule. patient may wash with soap and water wound, leg, and  foot with dressing changes. Edema Control - Lymphedema / SCD / Other Elevate legs to the level of the heart or above for 30 minutes daily and/or when sitting, a frequency of: Avoid standing for long periods of time. Moisturize legs daily. Off-Loading Other: - offloading shoe for left heel when pt. is up and walking. Pt. may wear pressure-relieving boots at bedtime. Patient may walk with PT as long as patient is wearing offloading shoe. Home Health New wound care orders this week; continue Home Health for wound care. May utilize formulary equivalent dressing for wound treatment orders unless otherwise specified. [facility] home health Monday's, Wednesday's, and Friday's. Every other Tuesday at wound center and all other days daughter to change. Wound Treatment Wound #1 - Calcaneus Wound Laterality: Left Cleanser: Soap and Water (Home Health) 1 x Per [id number] Days Discharge Instructions: May shower and wash wound with dial antibacterial soap and water prior to dressing change. Cleanser: Wound Cleanser (Home Health) 1 x Per Day/15 Days Discharge Instructions: Cleanse the wound with wound cleanser prior to applying a clean dressing using gauze sponges, not tissue or cotton balls. Prim Dressing: Santyl Ointment (Home Health) 1 x Per Day/15 Days ary Discharge Instructions: Apply nickel thick amount to wound bed as instructed Secondary Dressing: Woven Gauze Sponge, Non-Sterile 4x4 in (Home Health) 1 x Per Day/15 Days Discharge Instructions: Apply over primary dressing as directed. Secondary Dressing: ABD Pad, 5x9 (Home Health) 1  x Per WGN/56 Days Discharge Instructions: Apply over primary dressing as directed. Secured With: [employer], 4.5x3.1 (in/yd) (Home Health) 1 x Per Day/15 Days Discharge Instructions: Secure with Kerlix as directed. Secured With: 28M Medipore H Soft Cloth Surgical T 4 x 2 (in/yd) (Home Health) 1 x Per Day/15 Days ape Discharge Instructions: Secure dressing with tape  as directed. Wound #2 - Lower Leg Wound Laterality: Left, Medial Cleanser: Soap and Water (Home Health) 1 x Per OZH/08 Days Discharge Instructions: May shower and wash wound with dial antibacterial soap and water prior to dressing change. Cleanser: Wound Cleanser (Home Health) 1 x Per Day/15 Days Discharge Instructions: Cleanse the wound with wound cleanser prior to applying a clean dressing using gauze sponges, not tissue or cotton balls. Prim Dressing: Santyl Ointment (Home Health) 1 x Per Day/15 Days ary Discharge Instructions: Apply nickel thick amount to wound bed as instructed Secondary Dressing: Woven Gauze Sponge, Non-Sterile 4x4 in (Home Health) 1 x Per Day/15 Days Discharge Instructions: Apply over primary dressing as directed. Secondary Dressing: ABD Pad, 5x9 (Home Health) 1 x Per Day/15 Days Discharge Instructions: Apply over primary dressing as directed. Secondary Dressing: Zetuvit Plus Silicone Border Dressing 4x4 (in/in) (Home Health) 1 x Per Day/15 Days Discharge Instructions: Apply silicone border over primary dressing as directed.Marland KitchenMarland KitchenMarland KitchenMay use abd and kerlix or self adhesive foam Secured With: [employer], 4.5x3.1 (in/yd) (Home Health) 1 x Per Day/15 Days Discharge Instructions: Secure with Kerlix as directed. Secured With: 28M Medipore H Soft Cloth Surgical T 4 x 2 (in/yd) (Home Health) 1 x Per Day/15 Days ape Discharge Instructions: Secure dressing with tape as directed. Electronic Signature(s) Signed: 12/21/2020 4:28:01 PM By: Dorothy Ham MD Signed: 12/24/2020 3:14:58 PM By: Rhae Hammock RN Entered By: Rhae Hammock on 12/21/2020 12:13:50 -------------------------------------------------------------------------------- Problem List Details Patient Name: Date of Service: Dorothy Barker 12/21/2020 10:00 A M Medical Record Number: [id number] Patient Account Number: [id number] Date of Birth/Sex: Treating RN: 02/01/1941 (80 y.o. 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 Dorothy Barker, Dorothy Primary Care Provider: MO Dorothy Barker, Barker Other Clinician: Referring Provider: Treating Provider/Extender: Dorothy Barker MO RGA N, Barker Weeks in Treatment: 8 Subjective History of Present Illness (HPI) 10/26/2020 upon evaluation today patient appears to be doing somewhat poorly in regard to wounds on her left heel and left lower leg. The leg is really not nearly as bad as the heel ulcer however. This is an unstageable pressure ulcer unfortunately that occurred when the patient was in the hospital secondary to a stroke. At this point the patient is seen with her daughter as well and both are able to answer  and respond to questions. With that being said that the biggest issue right now is that the eschar on the heel is not really stable I think that this is going to need to be addressed with  more specific and appropriate dressings currently. Apparently she did see a podiatrist more recently and they did perform some sharp debridement to clear away necrotic tissue here on the heel. T be honest o the patient's daughter is not extremely pleased about that she states that they heal was completely dry and stable at that point. With that being said obviously I did not see anything at that time and I cannot really speak to what the heel look like and then but right now it definitely appears to be much softer and I do not think just Betadine is good to do the job. I think we have to focus on trying to get the eschar off of the heel at this point. The patient is in a assisted living facility currently. With that being said the daughter also questions whether or not she is going require skilled nursing. She does have a history of diabetes mellitus type 2. She also has a history of chronic venous insufficiency. 3/15; patient admitted to the clinic last week. She has an unstageable pressure ulcer on the left heel that apparently occurred at [facility] when she was recovering from the stroke. She spent some time at the rehab unit of [facility] and now is in [facility] assisted living. She has home health twice a week we have been using Iodoflex on this area. It was pointed out last week that she does not have palpable pulses below the femoral we ordered arterial studies but her daughter has not heard anything about this we will recheck this. 3/22; unstageable pressure ulcer on the left heel and an area on her left posterior calf. She had her this showed an ABI in the left of 1.15 however monophasic waveforms in the great toe TBI of 0.18 with a pressure of 39. Doppler analysis revealed monophasic waveforms at the CFA which by my way of thinking might suggest proximal disease she had a 50-74 stenosis of the distal SFA with a focal velocity elevation of 357 cm/s. We have been using Iodoflex to  both wound areas. The patient does not really complain of a lot of pain but she is not that ambulatory. 3/29 still unstageable area with eschar on the tip of the left heel. This is gradually separating. We have a vascular consult on April 5 4/21; patient was last seen almost 1 month ago. She was seen by Dr. Roselie Barker and had a angiogram of the left lower extremity with above-the-knee popliteal artery stenting. Patient states she feels well and has no complaints today. 5/3 the patient had an angiogram I have not reviewed this. Per Dr. Jodene Barker last note she had a stent to the left popliteal artery but I do not know the condition of the tibial vessels. In any case her major wound is on the tip of her left heel still with a black eschar we have been using Santyl Objective Constitutional Patient is hypertensive.. Pulse regular and within target range for patient.Marland Kitchen Respirations regular, non-labored and within target range.Marland Kitchen Appears in no distress. Vitals Time Taken: 10:23 AM, Height: 62 in, Weight: 173 lbs, BMI: 31.6, Temperature: 97.6 F, Pulse: 67 bpm, Respiratory Rate: 18 breaths/min, Blood Pressure: 174/88 mmHg. Cardiovascular She does now have a palpable left dorsalis pedis pulse although it is not vibrant.  I still do not feel a posterior tibial. General Notes: Weeks agoWound exam left heel black eschar still present. This was crosshatched debrided this. The area is loosening. No evidence of surrounding infection Integumentary (Hair, Skin) Wound #1 status is Open. Original cause of wound was Pressure Injury. The date acquired was: 09/21/2020. The wound has been in treatment 8 weeks. The wound is located on the Left Calcaneus. The wound measures 2cm length x 2.6cm width x 0.1cm depth; 4.084cm^2 area and 0.408cm^3 volume. There is Fat Layer (Subcutaneous Tissue) exposed. There is no tunneling or undermining noted. There is a small amount of serous drainage noted. The wound margin is distinct with the  outline attached to the wound base. There is no granulation within the wound bed. There is a large (67-100%) amount of necrotic tissue within the wound bed including Eschar. Wound #2 status is Open. Original cause of wound was Pressure Injury. The date acquired was: 10/19/2020. The wound has been in treatment 8 weeks. The wound is located on the Left,Medial Lower Leg. The wound measures 0.2cm length x 0.2cm width x 0.1cm depth; 0.031cm^2 area and 0.003cm^3 volume. There is Fat Layer (Subcutaneous Tissue) exposed. There is no tunneling or undermining noted. There is a small amount of serosanguineous drainage noted. The wound margin is distinct with the outline attached to the wound base. There is large (67-100%) red granulation within the wound bed. There is a small (1-33%) amount of necrotic tissue within the wound bed including Adherent Slough. 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 - Tuesday Bathing/ Shower/ Hygiene: May shower with protection but do not get wound dressing(s) wet. - patient may shower with cast protector with normal showering schedule. patient may wash with soap and water wound, leg, and foot with dressing changes. Edema Control - Lymphedema / SCD / Other: Elevate legs to the level of the heart or above for 30 minutes daily and/or when sitting, a frequency of: Avoid standing for long periods of time. Moisturize legs daily. Off-Loading: Other: - offloading shoe for left heel when pt. is up and walking. Pt. may wear pressure-relieving boots at bedtime. Patient may walk with PT as long as patient is wearing offloading shoe. Home  Health: New wound care orders this week; continue Home Health for wound care. May utilize formulary equivalent dressing for wound treatment orders unless otherwise specified. [facility] home health Monday's, Wednesday's, and Friday's. Every other Tuesday at wound center and all other days daughter to change. WOUND #1: - Calcaneus Wound Laterality: Left Cleanser: Soap and Water (Home Health) 1 x Per IEP/32 Days Discharge Instructions: May shower and wash wound with dial antibacterial soap and water prior to dressing change. Cleanser: Wound Cleanser (Home Health) 1 x Per Day/15 Days Discharge Instructions: Cleanse the wound with wound cleanser prior to applying a clean dressing using gauze sponges, not tissue or cotton balls. Prim Dressing: Santyl Ointment (Home Health) 1 x Per Day/15 Days ary Discharge Instructions: Apply nickel thick amount to wound bed as instructed Secondary Dressing: Woven Gauze Sponge, Non-Sterile 4x4 in (Home Health) 1 x Per Day/15 Days Discharge Instructions: Apply over primary dressing as directed. Secondary Dressing: ABD Pad, 5x9 (Home Health) 1 x Per RJJ/88 Days Discharge  Instructions: Apply over primary dressing as directed. Secured With: [employer], 4.5x3.1 (in/yd) (Home Health) 1 x Per Day/15 Days Discharge Instructions: Secure with Kerlix as directed. Secured With: 36M Medipore H Soft Cloth Surgical T 4 x 2 (in/yd) (Home Health) 1 x Per Day/15 Days ape Discharge Instructions: Secure dressing with tape as directed. WOUND #2: - Lower Leg Wound Laterality: Left, Medial Cleanser: Soap and Water (Home Health) 1 x Per Day/15 Days Discharge Instructions: May shower and wash wound with dial antibacterial soap and water prior to dressing change. Cleanser: Wound Cleanser (Home Health) 1 x Per Day/15 Days Discharge Instructions: Cleanse the wound with wound cleanser prior to applying a clean dressing using gauze sponges, not tissue or cotton balls. Prim Dressing:  Santyl Ointment (Home Health) 1 x Per Day/15 Days ary Discharge Instructions: Apply nickel thick amount to wound bed as instructed Secondary Dressing: Woven Gauze Sponge, Non-Sterile 4x4 in (Home Health) 1 x Per Day/15 Days Discharge Instructions: Apply over primary dressing as directed. Secondary Dressing: ABD Pad, 5x9 (Home Health) 1 x Per Day/15 Days Discharge Instructions: Apply over primary dressing as directed. Secured With: [employer], 4.5x3.1 (in/yd) (Home Health) 1 x Per Day/15 Days Discharge Instructions: Secure with Kerlix as directed. Secured With: 36M Medipore H Soft Cloth Surgical T 4 x 2 (in/yd) (Home Health) 1 x Per Day/15 Days ape Discharge Instructions: Secure dressing with tape as directed. 1. Santyl to continue to change daily 2. She has home health coming out 2 days a week on the week she is here in 3 days a week otherwise 3. I need to review her angiogram Electronic Signature(s) Signed: 12/21/2020 4:28:01 PM By: Dorothy Ham MD Entered By: Dorothy Barker on 12/21/2020 11:04:49 -------------------------------------------------------------------------------- SuperBill Details Patient Name: Date of Service: Dorothy Barker 12/21/2020 Medical Record Number: [id number] Patient Account Number: [id number] Date of Birth/Sex: Treating RN: Aug 13, 1941 (79 y.o. 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 Facility Procedures CPT4 Code:  [id number] Description: 27782 - WOUND CARE VISIT-LEV 5 EST PT Modifier: Quantity: 1 Physician Procedures : CPT4 Code Description Modifier [id number] - WC PHYS LEVEL 3 - EST PT ICD-10 Diagnosis Description L89.620 Pressure ulcer of left heel, unstageable I70.244 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

## 2020-12-24 NOTE — Progress Notes (Signed)
LEONARDO, PLAIA ([id number]) Visit Report for 12/21/2020 Arrival Information Details Patient Name: Date of Service: IDALIZ, TINKLE 12/21/2020 10:00 A M Medical Record Number: [id number] Patient Account Number: [id number] Date of Birth/Sex: Treating RN: 1940/10/11 (80 y.o. [name], [name] Primary Care Jettie Lazare: MO Geanie Cooley, RUFFIN Other Clinician: Referring Dametria Tuzzolino: Treating Zariah Jost/Extender: Linton Ham MO RGA N, RUFFIN Weeks in Treatment: 8 Visit Information History Since Last Visit Added or deleted any medications: No Patient Arrived: Wheel Chair Any new allergies or adverse reactions: No Arrival Time: 10:21 Had a fall or experienced change in No Accompanied By: daughter activities of daily living that may affect Transfer Assistance: None risk of falls: Patient Identification Verified: Yes Signs or symptoms of abuse/neglect since last visito No Secondary Verification Process Completed: Yes Hospitalized since last visit: No Patient Requires Transmission-Based Precautions: No Implantable device outside of the clinic excluding No Patient Has Alerts: Yes cellular tissue based products placed in the center Patient Alerts: Patient on Blood Thinner since last visit: Has Dressing in Place as Prescribed: Yes Pain Present Now: No Electronic Signature(s) Signed: 12/21/2020 10:51:39 AM By: Sandre Kitty Entered By: Sandre Kitty on 12/21/2020 10:22:20 -------------------------------------------------------------------------------- Clinic Level of Care Assessment Details Patient Name: Date of Service: FALEN, LEHRMANN 12/21/2020 10:00 A M Medical Record Number: [id number] Patient Account Number: [id number] Date of Birth/Sex: Treating RN: February 15, 1941 (80 y.o. [name], [name] Primary Care Myalee Stengel: MO RGA N, RUFFIN Other Clinician: Referring Isay Perleberg: Treating Chidinma Clites/Extender: Linton Ham MO RGA N, RUFFIN Weeks in Treatment: 8 Clinic Level of Care Assessment Items TOOL 4  Quantity Score X- 1 0 Use when only an EandM is performed on FOLLOW-UP visit ASSESSMENTS - Nursing Assessment / Reassessment X- 1 10 Reassessment of Co-morbidities (includes updates in patient status) X- 1 5 Reassessment of Adherence to Treatment Plan ASSESSMENTS - Wound and Skin A ssessment / Reassessment []  - 0 Simple Wound Assessment / Reassessment - one wound X- 2 5 Complex Wound Assessment / Reassessment - multiple wounds X- 1 10 Dermatologic / Skin Assessment (not related to wound area) ASSESSMENTS - Focused Assessment X- 1 5 Circumferential Edema Measurements - multi extremities X- 1 10 Nutritional Assessment / Counseling / Intervention []  - 0 Lower Extremity Assessment (monofilament, tuning fork, pulses) []  - 0 Peripheral Arterial Disease Assessment (using hand held doppler) ASSESSMENTS - Ostomy and/or Continence Assessment and Care []  - 0 Incontinence Assessment and Management []  - 0 Ostomy Care Assessment and Management (repouching, etc.) PROCESS - Coordination of Care X - Simple Patient / Family Education for ongoing care 1 15 []  - 0 Complex (extensive) Patient / Family Education for ongoing care X- 1 10 Staff obtains Programmer, systems, Records, T Results / Process Orders est X- 1 10 Staff telephones HHA, Nursing Homes / Clarify orders / etc []  - 0 Routine Transfer to another Facility (non-emergent condition) []  - 0 Routine Hospital Admission (non-emergent condition) []  - 0 New Admissions / Biomedical engineer / Ordering NPWT Apligraf, etc. , []  - 0 Emergency Hospital Admission (emergent condition) X- 1 10 Simple Discharge Coordination []  - 0 Complex (extensive) Discharge Coordination PROCESS - Special Needs []  - 0 Pediatric / Minor Patient Management []  - 0 Isolation Patient Management []  - 0 Hearing / Language / Visual special needs []  - 0 Assessment of Community assistance (transportation, D/C planning, etc.) []  - 0 Additional assistance /  Altered mentation []  - 0 Support Surface(s) Assessment (bed, cushion, seat, etc.) INTERVENTIONS - Wound Cleansing / Measurement []  - 0 Simple Wound  Cleansing - one wound X- 2 5 Complex Wound Cleansing - multiple wounds X- 1 5 Wound Imaging (photographs - any number of wounds) X- 1 5 Wound Tracing (instead of photographs) []  - 0 Simple Wound Measurement - one wound X- 2 5 Complex Wound Measurement - multiple wounds INTERVENTIONS - Wound Dressings []  - 0 Small Wound Dressing one or multiple wounds X- 2 15 Medium Wound Dressing one or multiple wounds []  - 0 Large Wound Dressing one or multiple wounds X- 1 5 Application of Medications - topical []  - 0 Application of Medications - injection INTERVENTIONS - Miscellaneous []  - 0 External ear exam []  - 0 Specimen Collection (cultures, biopsies, blood, body fluids, etc.) []  - 0 Specimen(s) / Culture(s) sent or taken to Lab for analysis []  - 0 Patient Transfer (multiple staff / Civil Service fast streamer / Similar devices) []  - 0 Simple Staple / Suture removal (25 or less) []  - 0 Complex Staple / Suture removal (26 or more) []  - 0 Hypo / Hyperglycemic Management (close monitor of Blood Glucose) []  - 0 Ankle / Brachial Index (ABI) - do not check if billed separately X- 1 5 Vital Signs Has the patient been seen at the hospital within the last three years: Yes Total Score: 165 Level Of Care: New/Established - Level 5 Electronic Signature(s) Signed: 12/24/2020 3:14:58 PM By: Rhae Hammock RN Entered By: Rhae Hammock on 12/21/2020 12:17:07 -------------------------------------------------------------------------------- Encounter Discharge Information Details Patient Name: Date of Service: [name] 12/21/2020 10:00 A M Medical Record Number: [id number] Patient Account Number: [id number] Date of Birth/Sex: Treating RN: Feb 15, 1941 (80 y.o. Elam Dutch Primary Care Loyed Wilmes: MO Geanie Cooley, RUFFIN Other Clinician: Referring  Merced Brougham: Treating Tiphany Fayson/Extender: Linton Ham MO RGA N, RUFFIN Weeks in Treatment: 8 Encounter Discharge Information Items Discharge Condition: Stable Ambulatory Status: Wheelchair Discharge Destination: Home Transportation: Private Auto Accompanied By: daughter Schedule Follow-up Appointment: Yes Clinical Summary of Care: Patient Declined Electronic Signature(s) Signed: 12/21/2020 5:08:22 PM By: Baruch Gouty RN, BSN Entered By: Baruch Gouty on 12/21/2020 11:27:12 -------------------------------------------------------------------------------- Lower Extremity Assessment Details Patient Name: Date of Service: [name] 12/21/2020 10:00 A M Medical Record Number: [id number] Patient Account Number: [id number] Date of Birth/Sex: Treating RN: [birth year] (80 y.o. Sue Lush Primary Care Winifred Balogh: MO Geanie Cooley, RUFFIN Other Clinician: Referring Jaimere Feutz: Treating Shermaine Brigham/Extender: Linton Ham MO RGA N, RUFFIN Weeks in Treatment: 8 Edema Assessment Assessed: [Left: Yes] [Right: No] Edema: [Left: Ye] [Right: s] Calf Left: Right: Point of Measurement: 28 cm From Medial Instep 34 cm Ankle Left: Right: Point of Measurement: 8 cm From Medial Instep 24 cm Vascular Assessment Pulses: Dorsalis Pedis Palpable: [Left:Yes] Electronic Signature(s) Signed: 12/21/2020 4:38:34 PM By: Lorrin Jackson Entered By: Lorrin Jackson on 12/21/2020 10:44:20 -------------------------------------------------------------------------------- Multi Wound Chart Details Patient Name: Date of Service: [name] 12/21/2020 10:00 A M Medical Record Number: [id number] Patient Account Number: [id number] Date of Birth/Sex: Treating RN: [birth year] (80 y.o. [name], [name] Primary Care Hosam Mcfetridge: MO Geanie Cooley, RUFFIN Other Clinician: Referring Buster Schueller: Treating Ainara Eldridge/Extender: Linton Ham MO RGA N, RUFFIN Weeks in Treatment: 8 Vital Signs Height(in): 90 Pulse(bpm):  19 Weight(lbs): 173 Blood Pressure(mmHg): 174/88 Body Mass Index(BMI): 32 Temperature(F): 97.6 Respiratory Rate(breaths/min): 18 Photos: [1:No Photos Left Calcaneus] [2:No Photos Left, Medial Lower Leg] [N/A:N/A N/A] Wound Location: [1:Pressure Injury] [2:Pressure Injury] [N/A:N/A] Wounding Event: [1:Pressure Ulcer] [2:Pressure Ulcer] [N/A:N/A] Primary Etiology: [1:Diabetic Wound/Ulcer of the Lower] [2:N/A] [N/A:N/A] Secondary Etiology: [1:Extremity Cataracts, Hypertension, Type II] [2:Cataracts, Hypertension, Type II] [N/A:N/A] Comorbid  History: [1:Diabetes, Osteoarthritis, Dementia 09/21/2020] [2:Diabetes, Osteoarthritis, Dementia 10/19/2020] [N/A:N/A] Date Acquired: [1:8] [2:8] [N/A:N/A] Weeks of Treatment: [1:Open] [2:Open] [N/A:N/A] Wound Status: [1:2x2.6x0.1] [2:0.2x0.2x0.1] [N/A:N/A] Measurements L x W x D (cm) [1:4.084] [2:0.031] [N/A:N/A] A (cm) : rea [1:0.408] [2:0.003] [N/A:N/A] Volume (cm) : [1:-3.20%] [2:86.90%] [N/A:N/A] % Reduction in A rea: [1:-3.00%] [2:87.50%] [N/A:N/A] % Reduction in Volume: [1:Unstageable/Unclassified] [2:Unstageable/Unclassified] [N/A:N/A] Classification: [1:Small] [2:Small] [N/A:N/A] Exudate A mount: [1:Serous] [2:Serosanguineous] [N/A:N/A] Exudate Type: [1:amber] [2:red, brown] [N/A:N/A] Exudate Color: [1:Distinct, outline attached] [2:Distinct, outline attached] [N/A:N/A] Wound Margin: [1:None Present (0%)] [2:Large (67-100%)] [N/A:N/A] Granulation A mount: [1:N/A] [2:Red] [N/A:N/A] Granulation Quality: [1:Large (67-100%)] [2:Small (1-33%)] [N/A:N/A] Necrotic A mount: [1:Eschar] [2:Adherent Slough] [N/A:N/A] Necrotic Tissue: [1:Fat Layer (Subcutaneous Tissue): Yes Fat Layer (Subcutaneous Tissue): Yes N/A] Exposed Structures: [1:Fascia: No Tendon: No Muscle: No Joint: No Bone: No Small (1-33%)] [2:Fascia: No Tendon: No Muscle: No Joint: No Bone: No Medium (34-66%)] [N/A:N/A] Treatment Notes Electronic Signature(s) Signed: 12/21/2020 4:28:01 PM  By: Linton Ham MD Signed: 12/24/2020 3:14:58 PM By: Rhae Hammock RN Entered By: Linton Ham on 12/21/2020 11:02:24 -------------------------------------------------------------------------------- Multi-Disciplinary Care Plan Details Patient Name: Date of Service: [name]. 12/21/2020 10:00 A M Medical Record Number: [id number] Patient Account Number: [id number] Date of Birth/Sex: Treating RN: 1941-02-11 (80 y.o. [name], [name] Primary Care Myrene Bougher: MO Geanie Cooley, RUFFIN Other Clinician: Referring Bionca Mckey: Treating Alixandrea Milleson/Extender: Linton Ham MO RGA N, RUFFIN Weeks in Treatment: 8 Active Inactive Wound/Skin Impairment Nursing Diagnoses: Impaired tissue integrity Knowledge deficit related to ulceration/compromised skin integrity Goals: Patient will have a decrease in wound volume by X% from date: (specify in notes) Date Initiated: 10/26/2020 Target Resolution Date: 01/14/2021 Goal Status: Active Patient/caregiver will verbalize understanding of skin care regimen Date Initiated: 10/26/2020 Target Resolution Date: 01/14/2021 Goal Status: Active Ulcer/skin breakdown will have a volume reduction of 30% by week 4 Date Initiated: 10/26/2020 Date Inactivated: 12/09/2020 Target Resolution Date: 12/09/2020 Goal Status: Unmet Unmet Reason: no major changes. Interventions: Assess patient/caregiver ability to obtain necessary supplies Assess patient/caregiver ability to perform ulcer/skin care regimen upon admission and as needed Assess ulceration(s) every visit Notes: Electronic Signature(s) Signed: 12/24/2020 3:14:58 PM By: Rhae Hammock RN Entered By: Rhae Hammock on 12/21/2020 12:14:11 -------------------------------------------------------------------------------- Pain Assessment Details Patient Name: Date of Service: [name] 12/21/2020 10:00 A M Medical Record Number: [id number] Patient Account Number: [id number] Date of Birth/Sex: Treating  RN: [birth year] (80 y.o. [name], [name] Primary Care Tiffini Blacksher: MO Geanie Cooley, RUFFIN Other Clinician: Referring Quantisha Marsicano: Treating Shandell Jallow/Extender: Linton Ham MO RGA N, RUFFIN Weeks in Treatment: 8 Active Problems Location of Pain Severity and Description of Pain Patient Has Paino No Site Locations Pain Management and Medication Current Pain Management: Electronic Signature(s) Signed: 12/21/2020 10:51:39 AM By: Sandre Kitty Signed: 12/24/2020 3:14:58 PM By: Rhae Hammock RN Entered By: Sandre Kitty on 12/21/2020 10:24:10 -------------------------------------------------------------------------------- Patient/Caregiver Education Details Patient Name: Date of Service: Romey, KA Y B. 5/3/2022andnbsp10:00 [place] Record Number: [id number] Patient Account Number: [id number] Date of Birth/Gender: Treating RN: 1940-12-18 (80 y.o. [name], [name] Primary Care Physician: MO Geanie Cooley, RUFFIN Other Clinician: Referring Physician: Treating Physician/Extender: Linton Ham MO RGA N, RUFFIN Weeks in Treatment: 8 Education Assessment Education Provided To: Patient Education Topics Provided Basic Hygiene: Methods: Explain/Verbal Responses: State content correctly Wound/Skin Impairment: Methods: Explain/Verbal Responses: State content correctly Electronic Signature(s) Signed: 12/24/2020 3:14:58 PM By: Rhae Hammock RN Entered By: Rhae Hammock on 12/21/2020 12:14:21 -------------------------------------------------------------------------------- Wound Assessment Details Patient Name: Date of Service: Shona Simpson B. 12/21/2020 10:00  A M Medical Record Number: [id number] Patient Account Number: [id number] Date of Birth/Sex: Treating RN: 10/30/1940 (80 y.o. [name], [name] Primary Care Starkisha Tullis: MO Geanie Cooley, RUFFIN Other Clinician: Referring Orpha Dain: Treating Modean Mccullum/Extender: Linton Ham MO RGA N, RUFFIN Weeks in Treatment: 8 Wound  Status Wound Number: 1 Primary Etiology: Pressure Ulcer Wound Location: Left Calcaneus Secondary Diabetic Wound/Ulcer of the Lower Extremity Etiology: Wounding Event: Pressure Injury Wound Status: Open Date Acquired: 09/21/2020 Comorbid Cataracts, Hypertension, Type II Diabetes, Osteoarthritis, Weeks Of Treatment: 8 History: Dementia Clustered Wound: No Photos Wound Measurements Length: (cm) 2 Width: (cm) 2.6 Depth: (cm) 0.1 Area: (cm) 4.084 Volume: (cm) 0.408 % Reduction in Area: -3.2% % Reduction in Volume: -3% Epithelialization: Small (1-33%) Tunneling: No Undermining: No Wound Description Classification: Unstageable/Unclassified Wound Margin: Distinct, outline attached Exudate Amount: Small Exudate Type: Serous Exudate Color: amber Foul Odor After Cleansing: No Slough/Fibrino No Wound Bed Granulation Amount: None Present (0%) Exposed Structure Necrotic Amount: Large (67-100%) Fascia Exposed: No Necrotic Quality: Eschar Fat Layer (Subcutaneous Tissue) Exposed: Yes Tendon Exposed: No Muscle Exposed: No Joint Exposed: No Bone Exposed: No Treatment Notes Wound #1 (Calcaneus) Wound Laterality: Left Cleanser Soap and Water Discharge Instruction: May shower and wash wound with dial antibacterial soap and water prior to dressing change. Wound Cleanser Discharge Instruction: Cleanse the wound with wound cleanser prior to applying a clean dressing using gauze sponges, not tissue or cotton balls. Peri-Wound Care Topical Primary Dressing Santyl Ointment Discharge Instruction: Apply nickel thick amount to wound bed as instructed Secondary Dressing Woven Gauze Sponge, Non-Sterile 4x4 in Discharge Instruction: Apply over primary dressing as directed. ABD Pad, 5x9 Discharge Instruction: Apply over primary dressing as directed. Secured With [employer], 4.5x3.1 (in/yd) Discharge Instruction: Secure with Kerlix as directed. 24M Medipore H Soft Cloth Surgical T 4  x 2 (in/yd) ape Discharge Instruction: Secure dressing with tape as directed. Compression Wrap Compression Stockings Add-Ons Electronic Signature(s) Signed: 12/21/2020 4:47:39 PM By: Sandre Kitty Signed: 12/24/2020 3:14:58 PM By: Rhae Hammock RN Entered By: Sandre Kitty on 12/21/2020 16:23:04 -------------------------------------------------------------------------------- Wound Assessment Details Patient Name: Date of Service: [name] 12/21/2020 10:00 A M Medical Record Number: [id number] Patient Account Number: [id number] Date of Birth/Sex: Treating RN: 17-Apr-1941 (80 y.o. [name], [name] Primary Care Charels Stambaugh: MO Geanie Cooley, RUFFIN Other Clinician: Referring Aaden Buckman: Treating Eldwin Volkov/Extender: Linton Ham MO RGA N, RUFFIN Weeks in Treatment: 8 Wound Status Wound Number: 2 Primary Pressure Ulcer Etiology: Wound Location: Left, Medial Lower Leg Wound Status: Open Wounding Event: Pressure Injury Comorbid Cataracts, Hypertension, Type II Diabetes, Osteoarthritis, Date Acquired: 10/19/2020 History: Dementia Weeks Of Treatment: 8 Clustered Wound: No Photos Wound Measurements Length: (cm) 0.2 Width: (cm) 0.2 Depth: (cm) 0.1 Area: (cm) 0.031 Volume: (cm) 0.003 % Reduction in Area: 86.9% % Reduction in Volume: 87.5% Epithelialization: Medium (34-66%) Tunneling: No Undermining: No Wound Description Classification: Unstageable/Unclassified Wound Margin: Distinct, outline attached Exudate Amount: Small Exudate Type: Serosanguineous Exudate Color: red, brown Foul Odor After Cleansing: No Slough/Fibrino No Wound Bed Granulation Amount: Large (67-100%) Exposed Structure Granulation Quality: Red Fascia Exposed: No Necrotic Amount: Small (1-33%) Fat Layer (Subcutaneous Tissue) Exposed: Yes Necrotic Quality: Adherent Slough Tendon Exposed: No Muscle Exposed: No Joint Exposed: No Bone Exposed: No Treatment Notes Wound #2 (Lower Leg) Wound  Laterality: Left, Medial Cleanser Soap and Water Discharge Instruction: May shower and wash wound with dial antibacterial soap and water prior to dressing change. Wound Cleanser Discharge Instruction: Cleanse the wound with wound cleanser prior to applying a clean dressing  using gauze sponges, not tissue or cotton balls. Peri-Wound Care Topical Primary Dressing Santyl Ointment Discharge Instruction: Apply nickel thick amount to wound bed as instructed Secondary Dressing Zetuvit Plus 4x4 in Discharge Instruction: Apply over primary dressing as directed. Secured With Compression Wrap Compression Stockings Environmental education officer) Signed: 12/21/2020 4:47:39 PM By: Sandre Kitty Signed: 12/24/2020 3:14:58 PM By: Rhae Hammock RN Entered By: Sandre Kitty on 12/21/2020 16:22:49 -------------------------------------------------------------------------------- Vitals Details Patient Name: Date of Service: [name] 12/21/2020 10:00 A M Medical Record Number: [id number] Patient Account Number: [id number] Date of Birth/Sex: Treating RN: Nov 03, 1940 (80 y.o. [name], [name] Primary Care Caliah Kopke: MO Geanie Cooley, RUFFIN Other Clinician: Referring Ryin Ambrosius: Treating Verland Sprinkle/Extender: Linton Ham MO RGA N, RUFFIN Weeks in Treatment: 8 Vital Signs Time Taken: 10:23 Temperature (F): 97.6 Height (in): 62 Pulse (bpm): 67 Weight (lbs): 173 Respiratory Rate (breaths/min): 18 Body Mass Index (BMI): 31.6 Blood Pressure (mmHg): 174/88 Reference Range: 80 - 120 mg / dl Electronic Signature(s) Signed: 12/21/2020 10:51:39 AM By: Sandre Kitty Entered By: Sandre Kitty on 12/21/2020 10:24:05

## 2021-01-04 ENCOUNTER — Other Ambulatory Visit: Payer: Self-pay

## 2021-01-04 ENCOUNTER — Ambulatory Visit
Admission: RE | Admit: 2021-01-04 | Discharge: 2021-01-04 | Disposition: A | Discharge status: Home / Self Care | Payer: Medicare PPO | Source: Ambulatory Visit | Attending: Neurology | Admitting: Neurology

## 2021-01-04 ENCOUNTER — Ambulatory Visit (HOSPITAL_COMMUNITY)
Admission: RE | Admit: 2021-01-04 | Discharge: 2021-01-04 | Disposition: A | Discharge status: Home / Self Care | Payer: Medicare PPO | Source: Ambulatory Visit | Attending: Vascular Surgery | Admitting: Vascular Surgery

## 2021-01-04 ENCOUNTER — Ambulatory Visit (INDEPENDENT_AMBULATORY_CARE_PROVIDER_SITE_OTHER)
Admit: 2021-01-04 | Discharge: 2021-01-04 | Disposition: A | Discharge status: Home / Self Care | Payer: Medicare PPO | Attending: Vascular Surgery | Admitting: Vascular Surgery

## 2021-01-04 ENCOUNTER — Ambulatory Visit (INDEPENDENT_AMBULATORY_CARE_PROVIDER_SITE_OTHER): Payer: Medicare PPO | Admitting: Physician Assistant

## 2021-01-04 VITALS — BP 157/85 | HR 77 | Temp 97.7°F | Resp 20 | Ht 60.0 in | Wt 170.0 lb

## 2021-01-04 DIAGNOSIS — I63133 Cerebral infarction due to embolism of bilateral carotid arteries: Secondary | ICD-10-CM

## 2021-01-04 DIAGNOSIS — I70244 Atherosclerosis of native arteries of left leg with ulceration of heel and midfoot: Secondary | ICD-10-CM

## 2021-01-04 NOTE — Progress Notes (Signed)
[facility] [place] HISTORY AND PHYSICAL   History of Present Illness:  Patient is a 80 y.o. 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   S/P angiogram with intervention left above knee popliteal artery stenting (6x37mm Eluvia).    She was instructed to continue dual platelet therapy with ASA and Plavix, as well as daily Statin.  She has a special shoe to keep weight off the heel.    Past Medical History:  Diagnosis Date  . Cancer ([place])    kidney  . Chronic kidney disease   . Diabetes mellitus without complication ([place])   . Hyperlipidemia   . Hypertension   . Stroke ([place])   . Thyroid disease   . Vitamin D deficiency     Past Surgical History:  Procedure Laterality Date  . ABDOMINAL AORTOGRAM W/LOWER EXTREMITY N/A 12/01/2020   Procedure: ABDOMINAL AORTOGRAM W/LOWER EXTREMITY;  Surgeon: Cherre Robins, MD;  Location: [facility];  Service: Cardiovascular;  Laterality: N/A;  . ABDOMINAL HYSTERECTOMY    . KNEE SURGERY Right   . NEPHRECTOMY Left   . PERIPHERAL VASCULAR INTERVENTION Left 12/01/2020   Procedure: PERIPHERAL VASCULAR INTERVENTION;  Surgeon: Cherre Robins, MD;  Location: [facility];  Service: Cardiovascular;  Laterality: Left;  . TONSILLECTOMY      ROS:   General:  No weight loss, Fever, chills  HEENT: No recent headaches, no nasal bleeding, no visual changes, no sore throat  Neurologic: No dizziness, blackouts, seizures. No recent symptoms of stroke or mini- stroke. No recent episodes of slurred speech, or temporary blindness.  Cardiac: No recent episodes of chest pain/pressure, no shortness of breath at rest.  No shortness of breath with exertion.  Denies history of atrial fibrillation or irregular heartbeat  Vascular: No history of rest pain in feet.  No history of claudication.  No history of non-healing ulcer, No history of DVT    Pulmonary: No home oxygen, no productive cough, no hemoptysis,  No asthma or wheezing  Musculoskeletal:  [ ]  Arthritis, [ ]  Low back pain,  [ ]  Joint pain  Hematologic:No history of hypercoagulable state.  No history of easy bleeding.  No history of anemia  Gastrointestinal: No hematochezia or melena,  No gastroesophageal reflux, no trouble swallowing  Urinary: [ ]  chronic Kidney disease, [ ]  on HD - [ ]  MWF or [ ]  TTHS, [ ]  Burning with urination, [ ]  Frequent urination, [ ]  Difficulty urinating;   Skin: No rashes  Psychological: No history of anxiety,  No history of depression  Social History Social History   Tobacco Use  . Smoking status: Passive Smoke Exposure - Never Smoker  . Smokeless tobacco: Never Used  Vaping Use  . Vaping Use: Never used  Substance Use Topics  . Alcohol use: Never  . Drug use: Never    Family History No family history on file.  Allergies  Allergies  Allergen Reactions  . Tramadol Other (See Comments)    Syncopal episodes   . Amlodipine Besy-Benazepril Hcl Rash  . Codeine Rash  . Naproxen Rash     Current Outpatient Medications  Medication Sig Dispense Refill  . acetaminophen (TYLENOL) 500 MG tablet Take 1,000 mg by mouth every 8 (eight) hours.    Marland Kitchen aspirin 81 MG EC tablet Take 81 mg by mouth every morning.    Marland Kitchen atorvastatin (LIPITOR) 40 MG tablet Take 40 mg by  mouth at bedtime.    . carvedilol (COREG) 6.25 MG tablet Take 6.25 mg by mouth 2 (two) times daily with a meal.    . cephALEXin (KEFLEX) 250 MG capsule Take 1 capsule (250 mg total) by mouth 4 (four) times daily. 20 capsule 0  . clopidogrel (PLAVIX) 75 MG tablet Take 1 tablet (75 mg total) by mouth daily. 30 tablet 11  . Dulaglutide (TRULICITY) 9.73 ZH/2.9JM SOPN Inject 0.75 mg into the skin every Thursday.    . ergocalciferol (VITAMIN D2) 1.25 MG (50000 UT) capsule Take 50,000 Units by mouth every Thursday.    . insulin aspart (NOVOLOG FLEXPEN) 100 UNIT/ML FlexPen Inject 2-4  Units into the skin See admin instructions. Inject 2-4 units subcutaneously before breakfast and supper: CBG 151-200 2 units, >200 4 units    . insulin glargine (LANTUS) 100 unit/mL SOPN Inject 16 Units into the skin at bedtime.    Marland Kitchen lisinopril-hydrochlorothiazide (ZESTORETIC) 20-12.5 MG tablet Take 2 tablets by mouth every morning.    . thyroid (ARMOUR) 60 MG tablet Take 60 mg by mouth every morning.    Marland Kitchen SANTYL ointment Apply topically daily.     No current facility-administered medications for this visit.    Physical Examination  Vitals:   01/04/21 1158  BP: (!) 157/85  Pulse: 77  Resp: 20  Temp: 97.7 F (36.5 C)  TempSrc: Temporal  SpO2: 100%  Weight: 170 lb (77.1 kg)  Height: 5' (1.524 m)    Body mass index is 33.2 kg/m.  General:  Alert and oriented, no acute distress HEENT: Normal Neck: No bruit or JVD Pulmonary: Clear to auscultation bilaterally Cardiac: Regular Rate and Rhythm without murmur Abdomen: Soft, non-tender, non-distended, no mass, no scars Skin: No rash      Musculoskeletal: No deformity or edema  Neurologic: Upper and lower extremity motor 5/5 and symmetric  DATA:  ABI Findings:  +---------+------------------+-----+----------+--------+  Right  Rt Pressure (mmHg)IndexWaveform Comment   +---------+------------------+-----+----------+--------+  Brachial 200                      +---------+------------------+-----+----------+--------+  PTA   131        0.63 monophasic      +---------+------------------+-----+----------+--------+  DP    255        1.23 monophasic      +---------+------------------+-----+----------+--------+  Great Toe97        0.47            +---------+------------------+-----+----------+--------+   +---------+------------------+-----+----------+-------+  Left   Lt Pressure (mmHg)IndexWaveform Comment   +---------+------------------+-----+----------+-------+  Brachial 207                      +---------+------------------+-----+----------+-------+  PTA   255        1.23 monophasic      +---------+------------------+-----+----------+-------+  DP    255        1.23 monophasic      +---------+------------------+-----+----------+-------+  Ellen Henri        0.54            +---------+------------------+-----+----------+-------+   +-------+-----------+-----------+------------+------------+  ABI/TBIToday's ABIToday's TBIPrevious ABIPrevious TBI  +-------+-----------+-----------+------------+------------+  Right [place]     0.47    [place]     0.36      +-------+-----------+-----------+------------+------------+  Left  [place]     0.54    [place]     0.18      +-------+-----------+-----------+------------+------------+      +----------+--------+-----+--------+----------+--------+  LEFT   PSV cm/sRatioStenosisWaveform Comments  +----------+--------+-----+--------+----------+--------+  CFA Distal89          biphasic       +----------+--------+-----+--------+----------+--------+  DFA    58          biphasic       +----------+--------+-----+--------+----------+--------+  SFA Prox 79          triphasic       +----------+--------+-----+--------+----------+--------+  SFA Mid  72          triphasic       +----------+--------+-----+--------+----------+--------+  SFA Distal54          biphasic       +----------+--------+-----+--------+----------+--------+  POP Distal84          monophasic      +----------+--------+-----+--------+----------+--------+     Left Stent(s):  +---------------+--++----------++  Prox to Stent 63monophasic   +---------------+--++----------++  Proximal Stent 47monophasic  +---------------+--++----------++  Mid Stent   40monophasic  +---------------+--++----------++  Distal Stent  45monophasic  +---------------+--++----------++  Distal to Stent53monophasic  +---------------+--++----------++     Summary:  Left: Patent left above knee popliteal artery stent with no stenosis seen.   ASSESSMENT: PAD s/p left popliteal stent to improve arterial flow and assist wound healing. He TBI shows improved index from 0.18 to 0.54 and the arterial duplex demonstrates a patent stent.  The heel wound is black eschar with yellow callous around the edges.  The daughter seems to think the wound is getting smaller.  There are no measurements or comparison pictures of the wound on prior visits.  She will remain with transfer training and non weight bearing on the left heel. She will f/u for wound check.  She will be on Plavix for 12 months and ASA for life, as well as Statin.        Roxy Horseman PA-C [facility] Office: [phone]  MD in clinic [place]

## 2021-01-06 ENCOUNTER — Other Ambulatory Visit: Payer: Self-pay

## 2021-01-06 ENCOUNTER — Encounter (HOSPITAL_BASED_OUTPATIENT_CLINIC_OR_DEPARTMENT_OTHER): Payer: Medicare PPO | Admitting: Internal Medicine

## 2021-01-06 DIAGNOSIS — E11621 Type 2 diabetes mellitus with foot ulcer: Secondary | ICD-10-CM | POA: Diagnosis not present

## 2021-01-07 ENCOUNTER — Telehealth: Payer: Self-pay | Admitting: Neurology

## 2021-01-07 NOTE — Progress Notes (Signed)
Dorothy Barker, Dorothy Barker ([id number]) Visit Report for 01/06/2021 Arrival Information Details Patient Name: Date of Service: Dorothy Barker, Dorothy Barker 01/06/2021 10:00 A M Medical Record Number: [id number] Patient Account Number: [id number] Date of Birth/Sex: Treating RN: 1940-11-02 (80 y.o. Sue Lush Primary Care Jarett Dralle: [place] Geanie Cooley, RUFFIN Other Clinician: Referring Aneisha Skyles: Treating Preston Garabedian/Extender: Linton Ham MO RGA N, RUFFIN Weeks in Treatment: 10 Visit Information History Since Last Visit Added or deleted any medications: No Patient Arrived: Wheel Chair Any new allergies or adverse reactions: No Arrival Time: 10:04 Had a fall or experienced change in No Accompanied By: Daughter activities of daily living that may affect Transfer Assistance: Manual risk of falls: Patient Identification Verified: Yes Signs or symptoms of abuse/neglect since last visito No Secondary Verification Process Completed: Yes Hospitalized since last visit: No Patient Requires Transmission-Based Precautions: No Implantable device outside of the clinic excluding No Patient Has Alerts: Yes cellular tissue based products placed in the center Patient Alerts: Patient on Blood Thinner since last visit: Has Dressing in Place as Prescribed: Yes Has Footwear/Offloading in Place as Prescribed: Yes Left: Wedge Shoe Pain Present Now: No Electronic Signature(s) Signed: 01/06/2021 5:39:50 PM By: Lorrin Jackson Entered By: Lorrin Jackson on 01/06/2021 10:05:50 -------------------------------------------------------------------------------- Encounter Discharge Information Details Patient Name: Date of Service: Dorothy Barker 01/06/2021 10:00 A M Medical Record Number: [id number] Patient Account Number: [id number] Date of Birth/Sex: Treating RN: 1941/03/27 (80 y.o. [name] Primary Care Lylian Sanagustin: MO Geanie Cooley, RUFFIN Other Clinician: Referring Amila Callies: Treating Clarinda Obi/Extender: Linton Ham MO RGA N,  RUFFIN Weeks in Treatment: 10 Encounter Discharge Information Items Post Procedure Vitals Discharge Condition: Stable Temperature (F): 98.4 Ambulatory Status: Wheelchair Pulse (bpm): 81 Discharge Destination: Home Respiratory Rate (breaths/min): 18 Transportation: Private Auto Blood Pressure (mmHg): 157/81 Accompanied By: daughter Schedule Follow-up Appointment: Yes Clinical Summary of Care: Electronic Signature(s) Signed: 01/06/2021 6:00:49 PM By: Deon Pilling Entered By: Deon Pilling on 01/06/2021 13:06:17 -------------------------------------------------------------------------------- Lower Extremity Assessment Details Patient Name: Date of Service: Dorothy Barker, Dorothy Barker 01/06/2021 10:00 A M Medical Record Number: [id number] Patient Account Number: [id number] Date of Birth/Sex: Treating RN: 1941-01-05 (80 y.o. Sue Lush Primary Care Naeemah Jasmer: MO Geanie Cooley, RUFFIN Other Clinician: Referring Gimena Buick: Treating Ramie Palladino/Extender: Linton Ham MO RGA N, RUFFIN Weeks in Treatment: 10 Edema Assessment Assessed: [Left: Yes] [Right: No] Edema: [Left: Ye] [Right: s] Calf Left: Right: Point of Measurement: 28 cm From Medial Instep 32 cm Ankle Left: Right: Point of Measurement: 8 cm From Medial Instep 20.8 cm Vascular Assessment Pulses: Dorsalis Pedis Palpable: [Left:Yes] Electronic Signature(s) Signed: 01/06/2021 5:39:50 PM By: Lorrin Jackson Entered By: Lorrin Jackson on 01/06/2021 10:13:02 -------------------------------------------------------------------------------- Multi Wound Chart Details Patient Name: Date of Service: Dorothy Barker 01/06/2021 10:00 A M Medical Record Number: [id number] Patient Account Number: [id number] Date of Birth/Sex: Treating RN: 06/05/1941 (80 y.o. [name], [name] Primary Care Ivone Licht: MO Geanie Cooley, RUFFIN Other Clinician: Referring Chinaza Rooke: Treating Peg Fifer/Extender: Linton Ham MO RGA N, RUFFIN Weeks in Treatment: 10 Vital  Signs Height(in): 61 Pulse(bpm): 71 Weight(lbs): 173 Blood Pressure(mmHg): 157/81 Body Mass Index(BMI): 32 Temperature(F): 98.4 Respiratory Rate(breaths/min): 18 Photos: [1:No Photos Left Calcaneus] [2:No Photos Left, Medial Lower Leg] [N/A:N/A N/A] Wound Location: [1:Pressure Injury] [2:Pressure Injury] [N/A:N/A] Wounding Event: [1:Pressure Ulcer] [2:Pressure Ulcer] [N/A:N/A] Primary Etiology: [1:Diabetic Wound/Ulcer of the Lower] [2:N/A] [N/A:N/A] Secondary Etiology: [1:Extremity Cataracts, Hypertension, Type II] [2:Cataracts, Hypertension, Type II] [N/A:N/A] Comorbid History: [1:Diabetes, Osteoarthritis, Dementia 09/21/2020] [2:Diabetes, Osteoarthritis, Dementia 10/19/2020] [N/A:N/A] Date Acquired: [1:10] [2:10] [N/A:N/A] Weeks of Treatment: [  1:Open] [2:Open] [N/A:N/A] Wound Status: [1:1.9x2.5x0.1] [2:0.2x0.2x0.1] [N/A:N/A] Measurements L x W x D (cm) [1:3.731] [2:0.031] [N/A:N/A] A (cm) : rea [1:0.373] [2:0.003] [N/A:N/A] Volume (cm) : [1:5.70%] [2:86.90%] [N/A:N/A] % Reduction in A rea: [1:5.80%] [2:87.50%] [N/A:N/A] % Reduction in Volume: [1:Unstageable/Unclassified] [2:Unstageable/Unclassified] [N/A:N/A] Classification: [1:Small] [2:Small] [N/A:N/A] Exudate A mount: [1:Serous] [2:Serosanguineous] [N/A:N/A] Exudate Type: [1:amber] [2:red, brown] [N/A:N/A] Exudate Color: [1:Distinct, outline attached] [2:Flat and Intact] [N/A:N/A] Wound Margin: [1:None Present (0%)] [2:Large (67-100%)] [N/A:N/A] Granulation A mount: [1:N/A] [2:Pink] [N/A:N/A] Granulation Quality: [1:Large (67-100%)] [2:None Present (0%)] [N/A:N/A] Necrotic A mount: [1:Eschar] [2:N/A] [N/A:N/A] Necrotic Tissue: [1:Fat Layer (Subcutaneous Tissue): Yes Fat Layer (Subcutaneous Tissue): Yes N/A] Exposed Structures: [1:Fascia: No Tendon: No Muscle: No Joint: No Bone: No Small (1-33%)] [2:Fascia: No Tendon: No Muscle: No Joint: No Bone: No Large (67-100%)] [N/A:N/A] Epithelialization:  [1:Chemical/Enzymatic/Mechanical] [2:Chemical/Enzymatic/Mechanical] [N/A:N/A] Debridement: [1:N/A] [2:N/A] [N/A:N/A] Instrument: [1:None] [2:None] [N/A:N/A] Bleeding: Debridement Treatment Response: Procedure was tolerated well [2:Procedure was tolerated well] [N/A:N/A] Post Debridement Measurements L x 1.9x2.5x0.1 [2:0.2x0.2x0.1] [N/A:N/A] W x D (cm) [1:0.373] [2:0.003] [N/A:N/A] Post Debridement Volume: (cm) [1:Unstageable/Unclassified] [2:Unstageable/Unclassified] [N/A:N/A] Post Debridement Stage: [1:Debridement] [2:Debridement] [N/A:N/A] Treatment Notes Electronic Signature(s) Signed: 01/06/2021 4:52:14 PM By: Linton Ham MD Signed: 01/07/2021 5:22:38 PM By: Rhae Hammock RN Entered By: Linton Ham on 01/06/2021 10:53:12 -------------------------------------------------------------------------------- Multi-Disciplinary Care Plan Details Patient Name: Date of Service: Dorothy Barker. 01/06/2021 10:00 A M Medical Record Number: [id number] Patient Account Number: [id number] Date of Birth/Sex: Treating RN: 1941/01/16 (80 y.o. [name] Primary Care Landa Mullinax: MO Geanie Cooley, RUFFIN Other Clinician: Referring Raelee Rossmann: Treating Staisha Winiarski/Extender: Linton Ham MO RGA N, RUFFIN Weeks in Treatment: 10 Active Inactive Wound/Skin Impairment Nursing Diagnoses: Impaired tissue integrity Knowledge deficit related to ulceration/compromised skin integrity Goals: Patient will have a decrease in wound volume by X% from date: (specify in notes) Date Initiated: 10/26/2020 Target Resolution Date: 01/14/2021 Goal Status: Active Patient/caregiver will verbalize understanding of skin care regimen Date Initiated: 10/26/2020 Target Resolution Date: 01/14/2021 Goal Status: Active Ulcer/skin breakdown will have a volume reduction of 30% by week 4 Date Initiated: 10/26/2020 Date Inactivated: 12/09/2020 Target Resolution Date: 12/09/2020 Goal Status: Unmet Unmet Reason: no major  changes. Interventions: Assess patient/caregiver ability to obtain necessary supplies Assess patient/caregiver ability to perform ulcer/skin care regimen upon admission and as needed Assess ulceration(s) every visit Notes: Electronic Signature(s) Signed: 01/06/2021 6:00:49 PM By: Deon Pilling Entered By: Deon Pilling on 01/06/2021 10:14:06 -------------------------------------------------------------------------------- Pain Assessment Details Patient Name: Date of Service: Dorothy Barker, Dorothy Barker 01/06/2021 10:00 [place] Record Number: [id number] Patient Account Number: [id number] Date of Birth/Sex: Treating RN: 05-25-1941 (80 y.o. Sue Lush Primary Care Carrah Eppolito: MO Geanie Cooley, RUFFIN Other Clinician: Referring Waymon Laser: Treating Alice Burnside/Extender: Linton Ham MO RGA N, RUFFIN Weeks in Treatment: 10 Active Problems Location of Pain Severity and Description of Pain Patient Has Paino No Site Locations Pain Management and Medication Current Pain Management: Electronic Signature(s) Signed: 01/06/2021 5:39:50 PM By: Lorrin Jackson Entered By: Lorrin Jackson on 01/06/2021 10:12:43 -------------------------------------------------------------------------------- Patient/Caregiver Education Details Patient Name: Date of Service: Dorothy Barker, Dorothy Y B. 5/19/2022andnbsp10:00 A M Medical Record Number: [id number] Patient Account Number: [id number] Date of Birth/Gender: Treating RN: Dec 14, 1940 (80 y.o. [name] Primary Care Physician: MO Geanie Cooley, RUFFIN Other Clinician: Referring Physician: Treating Physician/Extender: Linton Ham MO RGA N, RUFFIN Weeks in Treatment: 10 Education Assessment Education Provided To: Patient Education Topics Provided Wound/Skin Impairment: Handouts: Skin Care Do's and Dont's Methods: Explain/Verbal Responses: Reinforcements needed Electronic Signature(s) Signed: 01/06/2021 6:00:49 PM By: Deon Pilling  Entered By: Deon Pilling on 01/06/2021  10:14:18 -------------------------------------------------------------------------------- Wound Assessment Details Patient Name: Date of Service: Dorothy Barker, Dorothy Barker 01/06/2021 10:00 [place] Record Number: [id number] Patient Account Number: [id number] Date of Birth/Sex: Treating RN: 12-18-1940 (80 y.o. Sue Lush Primary Care Shammond Arave: MO Geanie Cooley, RUFFIN Other Clinician: Referring Lastacia Solum: Treating Lakeshia Dohner/Extender: Linton Ham MO RGA N, RUFFIN Weeks in Treatment: 10 Wound Status Wound Number: 1 Primary Etiology: Pressure Ulcer Wound Location: Left Calcaneus Secondary Diabetic Wound/Ulcer of the Lower Extremity Etiology: Wounding Event: Pressure Injury Wound Status: Open Date Acquired: 09/21/2020 Comorbid Cataracts, Hypertension, Type II Diabetes, Osteoarthritis, Weeks Of Treatment: 10 History: Dementia Clustered Wound: No Photos Wound Measurements Length: (cm) 1.9 Width: (cm) 2.5 Depth: (cm) 0.1 Area: (cm) 3.731 Volume: (cm) 0.373 % Reduction in Area: 5.7% % Reduction in Volume: 5.8% Epithelialization: Small (1-33%) Tunneling: No Undermining: No Wound Description Classification: Unstageable/Unclassified Wound Margin: Distinct, outline attached Exudate Amount: Small Exudate Type: Serous Exudate Color: amber Foul Odor After Cleansing: No Slough/Fibrino No Wound Bed Granulation Amount: None Present (0%) Exposed Structure Necrotic Amount: Large (67-100%) Fascia Exposed: No Necrotic Quality: Eschar Fat Layer (Subcutaneous Tissue) Exposed: Yes Tendon Exposed: No Muscle Exposed: No Joint Exposed: No Bone Exposed: No Treatment Notes Wound #1 (Calcaneus) Wound Laterality: Left Cleanser Wound Cleanser Discharge Instruction: Cleanse the wound with wound cleanser prior to applying a clean dressing using gauze sponges, not tissue or cotton balls. Soap and Water Discharge Instruction: May shower and wash wound with dial antibacterial soap and water prior to  dressing change. Peri-Wound Care Topical Primary Dressing Santyl Ointment Discharge Instruction: Apply nickel thick amount to wound bed as instructed Secondary Dressing Woven Gauze Sponge, Non-Sterile 4x4 in Discharge Instruction: Apply over primary dressing as directed. ABD Pad, 5x9 Discharge Instruction: Apply over primary dressing as directed. Secured With [employer], 4.5x3.1 (in/yd) Discharge Instruction: Secure with Kerlix as directed. 15M Medipore H Soft Cloth Surgical T 4 x 2 (in/yd) ape Discharge Instruction: Secure dressing with tape as directed. Compression Wrap Compression Stockings Add-Ons Electronic Signature(s) Signed: 01/06/2021 4:57:25 PM By: Sandre Kitty Signed: 01/06/2021 5:39:50 PM By: Lorrin Jackson Entered By: Sandre Kitty on 01/06/2021 16:48:30 -------------------------------------------------------------------------------- Wound Assessment Details Patient Name: Date of Service: Dorothy Barker 01/06/2021 10:00 A M Medical Record Number: [id number] Patient Account Number: [id number] Date of Birth/Sex: Treating RN: 28-May-1941 (80 y.o. Sue Lush Primary Care Shavontae Gibeault: MO Geanie Cooley, RUFFIN Other Clinician: Referring Rayel Santizo: Treating Meline Russaw/Extender: Linton Ham MO RGA N, RUFFIN Weeks in Treatment: 10 Wound Status Wound Number: 2 Primary Pressure Ulcer Etiology: Wound Location: Left, Medial Lower Leg Wound Status: Open Wounding Event: Pressure Injury Comorbid Cataracts, Hypertension, Type II Diabetes, Osteoarthritis, Date Acquired: 10/19/2020 History: Dementia Weeks Of Treatment: 10 Clustered Wound: No Photos Wound Measurements Length: (cm) 0.2 Width: (cm) 0.2 Depth: (cm) 0.1 Area: (cm) 0.031 Volume: (cm) 0.003 % Reduction in Area: 86.9% % Reduction in Volume: 87.5% Epithelialization: Large (67-100%) Tunneling: No Undermining: No Wound Description Classification: Unstageable/Unclassified Wound Margin: Flat and  Intact Exudate Amount: Small Exudate Type: Serosanguineous Exudate Color: red, brown Foul Odor After Cleansing: No Slough/Fibrino No Wound Bed Granulation Amount: Large (67-100%) Exposed Structure Granulation Quality: Pink Fascia Exposed: No Necrotic Amount: None Present (0%) Fat Layer (Subcutaneous Tissue) Exposed: Yes Tendon Exposed: No Muscle Exposed: No Joint Exposed: No Bone Exposed: No Treatment Notes Wound #2 (Lower Leg) Wound Laterality: Left, Medial Cleanser Wound Cleanser Discharge Instruction: Cleanse the wound with wound cleanser prior to applying a clean dressing using gauze  sponges, not tissue or cotton balls. Soap and Water Discharge Instruction: May shower and wash wound with dial antibacterial soap and water prior to dressing change. Peri-Wound Care Topical Primary Dressing Santyl Ointment Discharge Instruction: Apply nickel thick amount to wound bed as instructed Secondary Dressing Zetuvit Plus Silicone Border Dressing 4x4 (in/in) Discharge Instruction: Apply silicone border over primary dressing as directed.Marland KitchenMarland KitchenMarland KitchenMay use abd and kerlix or self adhesive foam Secured With Compression Wrap Compression Stockings Add-Ons Electronic Signature(s) Signed: 01/06/2021 4:57:25 PM By: Sandre Kitty Signed: 01/06/2021 5:39:50 PM By: Lorrin Jackson Entered By: Sandre Kitty on 01/06/2021 16:46:21 -------------------------------------------------------------------------------- Vitals Details Patient Name: Date of Service: Dorothy Barker. 01/06/2021 10:00 A M Medical Record Number: [id number] Patient Account Number: [id number] Date of Birth/Sex: Treating RN: [birth year] (80 y.o. Sue Lush Primary Care Coral Timme: MO Geanie Cooley, RUFFIN Other Clinician: Referring Markie Heffernan: Treating Hawley Pavia/Extender: Linton Ham MO RGA N, RUFFIN Weeks in Treatment: 10 Vital Signs Time Taken: 10:00 Temperature (F): 98.4 Height (in): 62 Pulse (bpm): 81 Weight (lbs):  173 Respiratory Rate (breaths/min): 18 Body Mass Index (BMI): 31.6 Blood Pressure (mmHg): 157/81 Reference Range: 80 - 120 mg / dl Electronic Signature(s) Signed: 01/06/2021 5:39:50 PM By: Lorrin Jackson Entered By: Lorrin Jackson on 01/06/2021 10:11:47

## 2021-01-07 NOTE — Progress Notes (Signed)
Dorothy Barker, Dorothy Barker ([id number]) Visit Report for 01/06/2021 Debridement Details Patient Name: Date of Service: Dorothy Barker 01/06/2021 10:00 A M Medical Record Number: [id number] Patient Account Number: [id number] Date of Birth/Sex: Treating RN: 07/20/1941 (80 y.o. Ardis Rowan, [name] Primary Care Provider: MO Rexene Edison, RUFFIN Other Clinician: Referring Provider: Treating Provider/Extender: Baltazar Najjar MO RGA N, RUFFIN Weeks in Treatment: 10 Debridement Performed for Assessment: Wound #1 Left Calcaneus Performed By: Clinician Zenaida Deed, RN Debridement Type: Chemical/Enzymatic/Mechanical Agent Used: Santyl Severity of Tissue Pre Debridement: Limited to breakdown of skin Level of Consciousness (Pre-procedure): Awake and Alert Pre-procedure Verification/Time Out No Taken: Bleeding: None Response to Treatment: Procedure was tolerated well Level of Consciousness (Post- Awake and Alert procedure): Post Debridement Measurements of Total Wound Length: (cm) 1.9 Stage: Unstageable/Unclassified Width: (cm) 2.5 Depth: (cm) 0.1 Volume: (cm) 0.373 Character of Wound/Ulcer Post Debridement: Requires Further Debridement Severity of Tissue Post Debridement: Limited to breakdown of skin Post Procedure Diagnosis Same as Pre-procedure Electronic Signature(s) Signed: 01/06/2021 4:52:14 PM By: Baltazar Najjar MD Signed: 01/07/2021 5:22:38 PM By: Fonnie Mu RN Entered By: Baltazar Najjar on 01/06/2021 10:53:26 -------------------------------------------------------------------------------- Debridement Details Patient Name: Date of Service: Dorothy Barker 01/06/2021 10:00 A M Medical Record Number: [id number] Patient Account Number: [id number] Date of Birth/Sex: Treating RN: 04/13/1941 (80 y.o. Ardis Rowan, [name] Primary Care Provider: MO Rexene Edison, RUFFIN Other Clinician: Referring Provider: Treating Provider/Extender: Baltazar Najjar MO RGA N, RUFFIN Weeks in Treatment: 10 Debridement  Performed for Assessment: Wound #2 Left,Medial Lower Leg Performed By: Clinician Zenaida Deed, RN Debridement Type: Chemical/Enzymatic/Mechanical Agent Used: Santyl Level of Consciousness (Pre-procedure): Awake and Alert Pre-procedure Verification/Time Out No Taken: Bleeding: None Response to Treatment: Procedure was tolerated well Level of Consciousness (Post- Awake and Alert procedure): Post Debridement Measurements of Total Wound Length: (cm) 0.2 Stage: Unstageable/Unclassified Width: (cm) 0.2 Depth: (cm) 0.1 Volume: (cm) 0.003 Character of Wound/Ulcer Post Debridement: Requires Further Debridement Post Procedure Diagnosis Same as Pre-procedure Electronic Signature(s) Signed: 01/06/2021 4:52:14 PM By: Baltazar Najjar MD Signed: 01/07/2021 5:22:38 PM By: Fonnie Mu RN Entered By: Baltazar Najjar on 01/06/2021 10:53:36 -------------------------------------------------------------------------------- HPI Details Patient Name: Date of Service: Dorothy Barker 01/06/2021 10:00 A M Medical Record Number: [id number] Patient Account Number: [id number] Date of Birth/Sex: Treating RN: 03/25/1941 (80 y.o. Ardis Rowan, [name] Primary Care Provider: MO Rexene Edison, RUFFIN Other Clinician: Referring Provider: Treating Provider/Extender: Baltazar Najjar MO RGA N, RUFFIN Weeks in Treatment: 10 History of Present Illness HPI Description: 10/26/2020 upon evaluation today patient appears to be doing somewhat poorly in regard to wounds on her left heel and left lower leg. The leg is really not nearly as bad as the heel ulcer however. This is an unstageable pressure ulcer unfortunately that occurred when the patient was in the hospital secondary to a stroke. At this point the patient is seen with her daughter as well and both are able to answer and respond to questions. With that being said that the biggest issue right now is that the eschar on the heel is not really stable I think that this is  going to need to be addressed with more specific and appropriate dressings currently. Apparently she did see a podiatrist more recently and they did perform some sharp debridement to clear away necrotic tissue here on the heel. T be honest the patient's daughter is not extremely pleased about that she states that they heal was completely dry and stable at that point. With that o being said obviously I did  not see anything at that time and I cannot really speak to what the heel look like and then but right now it definitely appears to be much softer and I do not think just Betadine is good to do the job. I think we have to focus on trying to get the eschar off of the heel at this point. The patient is in a assisted living facility currently. With that being said the daughter also questions whether or not she is going require skilled nursing. She does have a history of diabetes mellitus type 2. She also has a history of chronic venous insufficiency. 3/15; patient admitted to the clinic last week. She has an unstageable pressure ulcer on the left heel that apparently occurred at [facility] when she was recovering from the stroke. She spent [facility] time [facility] and now is in [facility] assisted living. She has home health twice a week we have been using Iodoflex on this area. It was pointed out last week that she does not have palpable pulses below the femoral we ordered arterial studies but her daughter has not heard anything about this we will recheck this. 3/22; unstageable pressure ulcer on the left heel and an area on her left posterior calf. She had her this showed an ABI in the left of 1.15 however monophasic waveforms in the great toe TBI of 0.18 with a pressure of 39. Doppler analysis revealed monophasic waveforms at the CFA which by my way of thinking might suggest proximal disease she had a 50-74 stenosis of the distal SFA with a focal velocity elevation of 357 cm/s. We  have been using Iodoflex to both wound areas. The patient does not really complain of a lot of pain but she is not that ambulatory. 3/29 still unstageable area with eschar on the tip of the left heel. This is gradually separating. We have a vascular consult on April 5 4/21; patient was last seen almost 1 month ago. She was seen by Dr. Roselie Awkward and had a angiogram of the left lower extremity with above-the-knee popliteal artery stenting. Patient states she feels well and has no complaints today. 5/3 the patient had an angiogram I have not reviewed this. Per Dr. Jodene Nam last note she had a stent to the left popliteal artery but I do not know the condition of the tibial vessels. 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 Fortunately the posterior tibial is the dominant flow to the foot. Her popliteal artery was stented. She followed up yesterday her ABI on the left was 1.23 monophasic waveforms but the TBI increased from 0.18-0.54 which is a major improvement. She has an area on the tip of her left heel and a small area on the left medial lower leg Electronic Signature(s) Signed: 01/06/2021 4:52:14 PM By: Linton Ham MD Entered By: Linton Ham on 01/06/2021 10:56:41 -------------------------------------------------------------------------------- Physical Exam Details Patient Name: Date of Service: [name] 01/06/2021 10:00 [place] Record Number: [id number] Patient Account Number: [id number] Date of Birth/Sex: Treating RN: 1941/02/26 (80 y.o. [name], [name] Primary Care Provider: MO Geanie Cooley, RUFFIN Other Clinician: Referring Provider: Treating Provider/Extender: Linton Ham MO [place]  N, RUFFIN Weeks in Treatment: 10 Constitutional Patient is hypertensive.. Pulse regular and within target range for patient.Marland Kitchen Respirations regular, non-labored and within target range.. Temperature is normal and within the target range for the patient.Marland Kitchen Appears in no distress. Notes Wound exam; left heel. I crosshatched the black eschar the last time she was here. This is separating we will continue with the Santyl. There is no evidence of surrounding infection On the left medial lower leg there is a very tiny area here some debris on the surface but I am not convinced that this is not healed already. I did not debride this Electronic Signature(s) Signed: 01/06/2021 4:52:14 PM By: Linton Ham MD Entered By: Linton Ham on 01/06/2021 10:57:31 -------------------------------------------------------------------------------- Physician Orders Details Patient Name: Date of Service: [name] 01/06/2021 10:00 A M Medical Record Number: [id number] Patient Account Number: [id number] Date of Birth/Sex: Treating RN: 1940-11-04 (80 y.o. 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 - Tuesday Return A Bathing/ Shower/ Hygiene May shower with protection but do not get wound dressing(s) wet. - patient may shower with cast protector with normal showering schedule. patient may wash with soap and water wound, leg,  and foot with dressing changes. Edema Control - Lymphedema / SCD / Other Elevate legs to the level of the heart or above for 30 minutes daily and/or when sitting, a frequency of: Avoid standing for long periods of time. Moisturize legs daily. Off-Loading Other: - offloading shoe for left heel when pt. is up and walking. Pt. may wear pressure-relieving boots at bedtime. Patient may walk with PT as long as patient is wearing offloading shoe. Home Health New wound care orders this week; continue Home Health for wound care. May utilize formulary equivalent dressing for wound treatment orders unless otherwise specified. [facility] Monday's, Wednesday's, and Friday's. Every other Tuesday at wound center and all other days daughter to change. Wound Treatment Wound #1 - Calcaneus Wound Laterality: Left Cleanser: Soap and Water (Home Health) 1 x Per [id number] Days Discharge Instructions: May shower and wash wound with dial antibacterial soap and water prior to dressing change. Cleanser: Wound Cleanser (Home Health) 1 x Per Day/15 Days Discharge Instructions: Cleanse the wound with wound cleanser prior to applying a clean dressing using gauze sponges, not tissue or cotton balls. Prim Dressing: Santyl Ointment (Home Health) 1 x Per Day/15 Days ary Discharge Instructions: Apply nickel thick amount to wound bed as instructed Secondary Dressing: Woven Gauze Sponge, Non-Sterile 4x4 in (Home Health) 1 x Per Day/15 Days Discharge Instructions: Apply over primary dressing as directed. Secondary Dressing: ABD Pad, 5x9 (Home Health) 1 x Per Day/15 Days Discharge Instructions: Apply over primary dressing as directed. Secured With: [employer], 4.5x3.1 (in/yd) (Home Health) 1 x Per Day/15 Days Discharge Instructions: Secure with Kerlix as directed. Secured With: 33M Medipore H Soft Cloth Surgical T 4 x 2 (in/yd) (Home Health) 1 x Per Day/15 Days ape Discharge Instructions: Secure dressing with  tape as directed. Wound #2 -  Lower Leg Wound Laterality: Left, Medial Cleanser: Soap and Water (Home Health) 1 x Per RJJ/88 Days Discharge Instructions: May shower and wash wound with dial antibacterial soap and water prior to dressing change. Cleanser: Wound Cleanser (Home Health) 1 x Per Day/15 Days Discharge Instructions: Cleanse the wound with wound cleanser prior to applying a clean dressing using gauze sponges, not tissue or cotton balls. Prim Dressing: Santyl Ointment (Home Health) 1 x Per Day/15 Days ary Discharge Instructions: Apply nickel thick amount to wound bed as instructed Secondary Dressing: Zetuvit Plus Silicone Border Dressing 4x4 (in/in) ([place]) 1 x Per Day/15 Days Discharge Instructions: Apply silicone border over primary dressing as directed.Marland KitchenMarland KitchenMarland KitchenMay use abd and kerlix or self adhesive foam Electronic Signature(s) Signed: 01/06/2021 4:52:14 PM By: Linton Ham MD Signed: 01/06/2021 6:00:49 PM By: Deon Pilling Entered By: Deon Pilling on 01/06/2021 10:28:01 -------------------------------------------------------------------------------- Problem List Details Patient Name: Date of Service: [name] 01/06/2021 10:00 A M Medical Record Number: [id number] Patient Account Number: [id number] Date of Birth/Sex: Treating RN: 03-24-1941 (80 y.o. 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 [name], [name] Primary Care Provider: MO Geanie Cooley, RUFFIN Other Clinician: Referring Provider: Treating Provider/Extender: Linton Ham MO RGA N, RUFFIN Weeks in Treatment: 10 Subjective History of Present Illness (HPI) 10/26/2020 upon evaluation today patient appears to be doing somewhat poorly in regard to wounds on her left heel and left lower leg. The leg is really not nearly as bad as the heel ulcer however. This is an unstageable pressure ulcer unfortunately that occurred when the patient was in the hospital secondary to a stroke. At this point the patient is seen with her daughter as well and both are able to answer and respond to questions. With that being said that the biggest issue right now is that the eschar on the heel is not really stable I think that this is going to need to be addressed with more specific and appropriate dressings currently. Apparently she did see a podiatrist more recently and they did perform some sharp debridement to clear away necrotic tissue here on the heel. T be honest o the patient's daughter is not extremely pleased about that she states that they heal was completely dry and stable at that point. With that being said obviously I did not see anything at that time and I cannot really speak to what the heel look like and then but right now it definitely appears to be much softer and I do not think just Betadine is good to do the job. I think we have to  focus on trying to get the eschar off of the heel at  this point. The patient is in a assisted living facility currently. With that being said the daughter also questions whether or not she is going require skilled nursing. She does have a history of diabetes mellitus type 2. She also has a history of chronic venous insufficiency. 3/15; patient admitted to the clinic last week. She has an unstageable pressure ulcer on the left heel that apparently occurred at [facility] when she was recovering from the stroke. She spent some time at the rehab unit [facility] and now is in [facility] assisted living. She has home health twice a week we have been using Iodoflex on this area. It was pointed out last week that she does not have palpable pulses below the femoral we ordered arterial studies but her daughter has not heard anything about this we will recheck this. 3/22; unstageable pressure ulcer on the left heel and an area on her left posterior calf. She had her this showed an ABI in the left of 1.15 however monophasic waveforms in the great toe TBI of 0.18 with a pressure of 39. Doppler analysis revealed monophasic waveforms at the CFA which by my way of thinking might suggest proximal disease she had a 50-74 stenosis of the distal SFA with a focal velocity elevation of 357 cm/s. We have been using Iodoflex to both wound areas. The patient does not really complain of a lot of pain but she is not that ambulatory. 3/29 still unstageable area with eschar on the tip of the left heel. This is gradually separating. We have a vascular consult on April 5 4/21; patient was last seen almost 1 month ago. She was seen by Dr. Roselie Awkward and had a angiogram of the left lower extremity with above-the-knee popliteal artery stenting. Patient states she feels well and has no complaints today. 5/3 the patient had an angiogram I have not reviewed this. Per Dr. Jodene Nam last note she had a stent to the left popliteal  artery but I do not know the condition of the tibial vessels. 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 Fortunately the posterior tibial is the dominant flow to the foot. Her popliteal artery was stented. She followed up yesterday her ABI on the left was 1.23 monophasic waveforms but the TBI increased from 0.18-0.54 which is a major improvement. She has an area on the tip of her left heel and a small area on the left medial lower leg Objective Constitutional Patient is hypertensive.. Pulse regular and within target range for patient.Marland Kitchen Respirations regular, non-labored and within target range.. Temperature is normal and within the target range for the patient.Marland Kitchen Appears in no distress. Vitals Time Taken: 10:00 AM, Height: 62 in, Weight: 173 lbs, BMI: 31.6, Temperature: 98.4 F, Pulse: 81 bpm, Respiratory Rate: 18 breaths/min, Blood Pressure: 157/81 mmHg. General Notes: Wound exam; left heel. I crosshatched the black eschar the last time she was here. This is separating we will continue with the Santyl. There is no evidence of surrounding infection oo On the left medial lower leg there is a very tiny area here some debris on the surface but I am not convinced that this is not healed already. I did not debride  this Integumentary (Hair, Skin) Wound #1 status is Open. Original cause of wound was Pressure Injury. The date acquired was: 09/21/2020. The wound has been in treatment 10 weeks. The wound is located on the Left Calcaneus. The wound measures 1.9cm length x 2.5cm width x 0.1cm depth; 3.731cm^2 area and 0.373cm^3 volume. There is Fat Layer (Subcutaneous Tissue) exposed. There is no tunneling or  undermining noted. There is a small amount of serous drainage noted. The wound margin is distinct with the outline attached to the wound base. There is no granulation within the wound bed. There is a large (67-100%) amount of necrotic tissue within the wound bed including Eschar. Wound #2 status is Open. Original cause of wound was Pressure Injury. The date acquired was: 10/19/2020. The wound has been in treatment 10 weeks. The wound is located on the Left,Medial Lower Leg. The wound measures 0.2cm length x 0.2cm width x 0.1cm depth; 0.031cm^2 area and 0.003cm^3 volume. There is Fat Layer (Subcutaneous Tissue) exposed. There is no tunneling or undermining noted. There is a small amount of serosanguineous drainage noted. The wound margin is flat and intact. There is large (67-100%) pink granulation within the wound bed. There is no necrotic tissue within the wound bed. 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 There was a Chemical/Enzymatic/Mechanical debridement performed by Zenaida DeedBoehlein, Linda, RN.Marland Kitchen. Agent used was [employer]ant[employer]. There was no bleeding. The procedure was tolerated well. Post Debridement Measurements: 1.9cm length x 2.5cm width x 0.1cm depth; 0.373cm^3 volume. Post debridement Stage noted as Unstageable/Unclassified. Character of Wound/Ulcer Post Debridement requires further debridement. Severity of Tissue Post Debridement is: Limited to breakdown of skin. Post procedure Diagnosis Wound #1: Same as Pre-Procedure Wound #2 Pre-procedure  diagnosis of Wound #2 is a Pressure Ulcer located on the Left,Medial Lower Leg . There was a Chemical/Enzymatic/Mechanical debridement performed by Zenaida DeedBoehlein, Linda, RN.Marland Kitchen. Agent used was [employer]antyl. There was no bleeding. The procedure was tolerated well. Post Debridement Measurements: 0.2cm length x 0.2cm width x 0.1cm depth; 0.003cm^3 volume. Post debridement Stage noted as Unstageable/Unclassified. Character of Wound/Ulcer Post Debridement requires further debridement. Post procedure Diagnosis Wound #2: Same as Pre-Procedure Plan Follow-up Appointments: Return Appointment in 2 weeks. - Tuesday Bathing/ Shower/ Hygiene: May shower with protection but do not get wound dressing(s) wet. - patient may shower with cast protector with normal showering schedule. patient may wash with soap and water wound, leg, and foot with dressing changes. Edema Control - Lymphedema / SCD / Other: Elevate legs to the level of the heart or above for 30 minutes daily and/or when sitting, a frequency of: Avoid standing for long periods of time. Moisturize legs daily. Off-Loading: Other: - offloading shoe for left heel when pt. is up and walking. Pt. may wear pressure-relieving boots at bedtime. Patient may walk with PT as long as patient is wearing offloading shoe. Home Health: New wound care orders this week; continue Home Health for wound care. May utilize formulary equivalent dressing for wound treatment orders unless otherwise specified. [facility] Monday's, Wednesday's, and Friday's. Every other Tuesday at wound center and all other days daughter  to change. WOUND #1: - Calcaneus Wound Laterality: Left Cleanser: Soap and Water (Home Health) 1 x Per QPY/19 Days Discharge Instructions: May shower and wash wound with dial antibacterial soap and water prior to dressing change. Cleanser: Wound Cleanser (Home Health) 1 x Per Day/15 Days Discharge Instructions: Cleanse the wound with wound cleanser prior to  applying a clean dressing using gauze sponges, not tissue or cotton balls. Prim Dressing: Santyl Ointment (Home Health) 1 x Per Day/15 Days ary Discharge Instructions: Apply nickel thick amount to wound bed as instructed Secondary Dressing: Woven Gauze Sponge, Non-Sterile 4x4 in (Home Health) 1 x Per Day/15 Days Discharge Instructions: Apply over primary dressing as directed. Secondary Dressing: ABD Pad, 5x9 (Home Health) 1 x Per Day/15 Days Discharge Instructions: Apply over primary dressing as directed. Secured With: [employer], 4.5x3.1 (in/yd) (Home Health) 1 x Per Day/15 Days Discharge Instructions: Secure with Kerlix as directed. Secured With: 42M [employer] H Soft Cloth Surgical T 4 x 2 (in/yd) (Home Health) 1 x Per Day/15 Days ape Discharge Instructions: Secure dressing with tape as directed. WOUND #2: - Lower Leg Wound Laterality: Left, Medial Cleanser: Soap and Water (Home Health) 1 x Per Day/15 Days Discharge Instructions: May shower and wash wound with dial antibacterial soap and water prior to dressing change. Cleanser: Wound Cleanser (Home Health) 1 x Per Day/15 Days Discharge Instructions: Cleanse the wound with wound cleanser prior to applying a clean dressing using gauze sponges, not tissue or cotton balls. Prim Dressing: Santyl Ointment (Home Health) 1 x Per Day/15 Days ary Discharge Instructions: Apply nickel thick amount to wound bed as instructed Secondary Dressing: Zetuvit Plus Silicone Border Dressing 4x4 (in/in) ([place]) 1 x Per Day/15 Days Discharge Instructions: Apply silicone border over primary dressing as directed.Marland KitchenMarland KitchenMarland KitchenMay use abd and kerlix or self adhesive foam 1. Continue with Santyl to both wound areas. 2. The problem here is that the patient's daughter is out of town next week. She only has home health 3 times a week I am not exactly sure whether Santyl alone would maintain this I added hydrogel to this to help separate the heel wound 3. Her PAD was  intervened on by Dr. Luan Pulling of vein and vascular. Follow-up TBI is a lot better from 0.18 the 0.54. She has palpable dorsalis pedis and posterior tibial pulses in the left foot. This is an Careers information officer) Signed: 01/06/2021 4:52:14 PM By: Linton Ham MD Entered By: Linton Ham on 01/06/2021 10:59:02 -------------------------------------------------------------------------------- SuperBill Details Patient Name: Date of Service: [name] 01/06/2021 Medical Record Number: [id number] Patient Account Number: [id number] Date of Birth/Sex: Treating RN: [birth year] (79 y.o. 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

## 2021-01-07 NOTE — Telephone Encounter (Signed)
I called the patient.  MRI of the brain does not show any definite new stroke event that would explain right hand numbness.  The patient is to go on the cardiac monitor in the next several days.  We will look at this issue once the results are available.   MRI brain 01/06/21:  IMPRESSION:   MRI brain without contrast demonstrating: -Mild atrophy and mild chronic small vessel ischemic disease. -No acute findings.

## 2021-01-08 ENCOUNTER — Ambulatory Visit (INDEPENDENT_AMBULATORY_CARE_PROVIDER_SITE_OTHER): Payer: Medicare PPO

## 2021-01-08 ENCOUNTER — Encounter: Payer: Self-pay | Admitting: Neurology

## 2021-01-08 DIAGNOSIS — I639 Cerebral infarction, unspecified: Secondary | ICD-10-CM

## 2021-01-08 DIAGNOSIS — I4891 Unspecified atrial fibrillation: Secondary | ICD-10-CM | POA: Diagnosis not present

## 2021-01-08 DIAGNOSIS — I63133 Cerebral infarction due to embolism of bilateral carotid arteries: Secondary | ICD-10-CM | POA: Diagnosis not present

## 2021-01-14 ENCOUNTER — Encounter (HOSPITAL_COMMUNITY): Payer: Self-pay | Admitting: Emergency Medicine

## 2021-01-14 ENCOUNTER — Emergency Department (HOSPITAL_COMMUNITY)
Admission: EM | Admit: 2021-01-14 | Discharge: 2021-01-15 | Disposition: A | Discharge status: Home / Self Care | Payer: Medicare PPO | Attending: Emergency Medicine | Admitting: Emergency Medicine

## 2021-01-14 DIAGNOSIS — R202 Paresthesia of skin: Secondary | ICD-10-CM | POA: Diagnosis not present

## 2021-01-14 DIAGNOSIS — I10 Essential (primary) hypertension: Secondary | ICD-10-CM

## 2021-01-14 DIAGNOSIS — Z794 Long term (current) use of insulin: Secondary | ICD-10-CM | POA: Insufficient documentation

## 2021-01-14 DIAGNOSIS — E1122 Type 2 diabetes mellitus with diabetic chronic kidney disease: Secondary | ICD-10-CM | POA: Diagnosis not present

## 2021-01-14 DIAGNOSIS — Z7722 Contact with and (suspected) exposure to environmental tobacco smoke (acute) (chronic): Secondary | ICD-10-CM | POA: Insufficient documentation

## 2021-01-14 DIAGNOSIS — Z79899 Other long term (current) drug therapy: Secondary | ICD-10-CM | POA: Diagnosis not present

## 2021-01-14 DIAGNOSIS — Z85528 Personal history of other malignant neoplasm of kidney: Secondary | ICD-10-CM | POA: Insufficient documentation

## 2021-01-14 DIAGNOSIS — Z7902 Long term (current) use of antithrombotics/antiplatelets: Secondary | ICD-10-CM | POA: Insufficient documentation

## 2021-01-14 DIAGNOSIS — R531 Weakness: Secondary | ICD-10-CM | POA: Insufficient documentation

## 2021-01-14 DIAGNOSIS — N189 Chronic kidney disease, unspecified: Secondary | ICD-10-CM | POA: Insufficient documentation

## 2021-01-14 DIAGNOSIS — Z7982 Long term (current) use of aspirin: Secondary | ICD-10-CM | POA: Diagnosis not present

## 2021-01-14 DIAGNOSIS — E039 Hypothyroidism, unspecified: Secondary | ICD-10-CM | POA: Insufficient documentation

## 2021-01-14 DIAGNOSIS — R030 Elevated blood-pressure reading, without diagnosis of hypertension: Secondary | ICD-10-CM | POA: Diagnosis present

## 2021-01-14 DIAGNOSIS — I129 Hypertensive chronic kidney disease with stage 1 through stage 4 chronic kidney disease, or unspecified chronic kidney disease: Secondary | ICD-10-CM | POA: Diagnosis not present

## 2021-01-14 LAB — COMPREHENSIVE METABOLIC PANEL
ALT: 19 U/L (ref 0–44)
AST: 20 U/L (ref 15–41)
Albumin: 3.6 g/dL (ref 3.5–5.0)
Alkaline Phosphatase: 73 U/L (ref 38–126)
Anion gap: 7 (ref 5–15)
BUN: 33 mg/dL — ABNORMAL HIGH (ref 8–23)
CO2: 28 mmol/L (ref 22–32)
Calcium: 10.2 mg/dL (ref 8.9–10.3)
Chloride: 103 mmol/L (ref 98–111)
Creatinine, Ser: 1.34 mg/dL — ABNORMAL HIGH (ref 0.44–1.00)
GFR, Estimated: 40 mL/min — ABNORMAL LOW (ref >60)
Glucose, Bld: 210 mg/dL — ABNORMAL HIGH (ref 70–99)
Potassium: 3.9 mmol/L (ref 3.5–5.1)
Sodium: 138 mmol/L (ref 135–145)
Total Bilirubin: 0.4 mg/dL (ref 0.3–1.2)
Total Protein: 7.1 g/dL (ref 6.5–8.1)

## 2021-01-14 LAB — CBC WITH DIFFERENTIAL/PLATELET
Abs Immature Granulocytes: 0.02 10*3/uL (ref 0.00–0.07)
Basophils Absolute: 0 10*3/uL (ref 0.0–0.1)
Basophils Relative: 1 %
Eosinophils Absolute: 0.2 10*3/uL (ref 0.0–0.5)
Eosinophils Relative: 2 %
HCT: 37.6 % (ref 36.0–46.0)
Hemoglobin: 12.3 g/dL (ref 12.0–15.0)
Immature Granulocytes: 0 %
Lymphocytes Relative: 33 %
Lymphs Abs: 2.7 10*3/uL (ref 0.7–4.0)
MCH: 29.8 pg (ref 26.0–34.0)
MCHC: 32.7 g/dL (ref 30.0–36.0)
MCV: 91 fL (ref 80.0–100.0)
Monocytes Absolute: 0.5 10*3/uL (ref 0.1–1.0)
Monocytes Relative: 7 %
Neutro Abs: 4.6 10*3/uL (ref 1.7–7.7)
Neutrophils Relative %: 57 %
Platelets: 224 10*3/uL (ref 150–400)
RBC: 4.13 MIL/uL (ref 3.87–5.11)
RDW: 13.2 % (ref 11.5–15.5)
WBC: 8 10*3/uL (ref 4.0–10.5)
nRBC: 0 % (ref 0.0–0.2)

## 2021-01-14 MED ORDER — HYDRALAZINE HCL 10 MG PO TABS
10.0000 mg | ORAL_TABLET | Freq: Once | ORAL | Status: AC
  Administered 2021-01-14: 10 mg via ORAL
  Filled 2021-01-14: qty 1

## 2021-01-14 MED ORDER — CARVEDILOL 3.125 MG PO TABS
6.2500 mg | ORAL_TABLET | Freq: Once | ORAL | Status: AC
  Administered 2021-01-14: 6.25 mg via ORAL
  Filled 2021-01-14: qty 2

## 2021-01-14 NOTE — ED Triage Notes (Signed)
Per EMS- Patient is from [place]. Staff reported that the patient has been hypertensive x 2 days. Patient has a history of stsrokes x 2.

## 2021-01-14 NOTE — ED Provider Notes (Signed)
[facility] DEPT Provider Note   CSN: [id number] Arrival date & time: 01/14/21  1516     History Chief Complaint  Patient presents with  . Hypertension    Dorothy Barker is a 80 y.o. female.  Patient is an 80 year old female with a history of CKD, diabetes, hypertension, hyperlipidemia, stroke who is presenting today from her living facility due to elevated blood pressure for the last 2 days.  Patient reports no new symptoms.  She has been feeling her normal self and denies any headache, chest pain, shortness of breath, dizziness, weakness.  She states for weeks now she has had some weakness and numbness in her right hand but they cannot seem to find what is causing it.  Had an MRI 10 days ago that was negative.  Patient is currently wearing cardiac monitor for further evaluation.  She does take Coreg 6.25 and Zestoretic which she has been on for some time.  She is assuming that she is taking her medications regularly as her facility provides them for her.  She has been eating and drinking normally.  She has not skipped any of her medications.  She does not know what her normal blood pressure is but facility reported 180/100 prior to arrival today.  The history is provided by the patient.  Hypertension       Past Medical History:  Diagnosis Date  . Cancer ([place])    kidney  . Chronic kidney disease   . Diabetes mellitus without complication ([place])   . Hyperlipidemia   . Hypertension   . Stroke ([place])   . Thyroid disease   . Vitamin D deficiency     Patient Active Problem List   Diagnosis Date Noted  . Decubitus ulcer of heel, left, unstageable ([place]) 10/18/2020  . 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  . Venous insufficiency of both lower extremities 10/18/2020  . Fall 09/14/2020  . Renal cyst, right 07/26/2020  . Numbness and tingling of foot 07/02/2017  . Type 2 diabetes mellitus with diabetic neuropathy, without long-term current use of  insulin ([place]) 07/02/2017  . Onychomycosis due to dermatophyte 12/29/2016  . Other acquired hammer toe 01/03/2016  . Essential hypertension 03/03/2008  . Hypothyroidism 03/03/2008    Past Surgical History:  Procedure Laterality Date  . ABDOMINAL AORTOGRAM W/LOWER EXTREMITY N/A 12/01/2020   Procedure: ABDOMINAL AORTOGRAM W/LOWER EXTREMITY;  Surgeon: Cherre Robins, MD;  Location: [facility];  Service: Cardiovascular;  Laterality: N/A;  . ABDOMINAL HYSTERECTOMY    . KNEE SURGERY Right   . NEPHRECTOMY Left   . PERIPHERAL VASCULAR INTERVENTION Left 12/01/2020   Procedure: PERIPHERAL VASCULAR INTERVENTION;  Surgeon: Cherre Robins, MD;  Location: [facility];  Service: Cardiovascular;  Laterality: Left;  . TONSILLECTOMY       OB History   No obstetric history on file.     No family history on file.  Social History   Tobacco Use  . Smoking status: Passive Smoke Exposure - Never Smoker  . Smokeless tobacco: Never Used  Vaping Use  . Vaping Use: Never used  Substance Use Topics  . Alcohol use: Never  . Drug use: Never    Home Medications Prior to Admission medications   Medication Sig Start Date End Date Taking? Authorizing Provider  acetaminophen (TYLENOL) 500 MG tablet Take 1,000 mg by mouth every 8 (eight) hours.    [provider]  aspirin 81 MG EC tablet Take 81 mg by mouth  every morning. 09/21/20   [provider]  atorvastatin (LIPITOR) 40 MG tablet Take 40 mg by mouth at bedtime. 10/11/20   [provider]  carvedilol (COREG) 6.25 MG tablet Take 6.25 mg by mouth 2 (two) times daily with a meal. 09/21/20   [provider]  cephALEXin (KEFLEX) 250 MG capsule Take 1 capsule (250 mg total) by mouth 4 (four) times daily. 11/27/20   Daleen Bo, MD  clopidogrel (PLAVIX) 75 MG tablet Take 1 tablet (75 mg total) by mouth daily. 12/02/20   Cherre Robins, MD  Dulaglutide (TRULICITY) 0.76 AU/6.3FH SOPN Inject 0.75 mg into the skin  every Thursday. 09/21/20   [provider]  ergocalciferol (VITAMIN D2) 1.25 MG (50000 UT) capsule Take 50,000 Units by mouth every Thursday. 09/21/20   [provider]  insulin aspart (NOVOLOG FLEXPEN) 100 UNIT/ML FlexPen Inject 2-4 Units into the skin See admin instructions. Inject 2-4 units subcutaneously before breakfast and supper: CBG 151-200 2 units, >200 4 units    [provider]  insulin glargine (LANTUS) 100 unit/mL SOPN Inject 16 Units into the skin at bedtime.    [provider]  lisinopril-hydrochlorothiazide (ZESTORETIC) 20-12.5 MG tablet Take 2 tablets by mouth every morning. 09/21/20   [provider]  SANTYL ointment Apply topically daily. 12/10/20   [provider]  thyroid (ARMOUR) 60 MG tablet Take 60 mg by mouth every morning.    [provider]    Allergies    Tramadol, Amlodipine besy-benazepril hcl, Codeine, and Naproxen  Review of Systems   Review of Systems  All other systems reviewed and are negative.   Physical Exam Updated Vital Signs BP (!) 174/95 (BP Location: Left Arm)   Pulse (!) 16   Temp 98 F (36.7 C) (Oral)   Resp 16   SpO2 96%   Physical Exam Vitals and nursing note reviewed.  Constitutional:      General: She is not in acute distress.    Appearance: Normal appearance. She is well-developed.  HENT:     Head: Normocephalic and atraumatic.     Mouth/Throat:     Mouth: Mucous membranes are moist.  Eyes:     Pupils: Pupils are equal, round, and reactive to light.  Cardiovascular:     Rate and Rhythm: Normal rate and regular rhythm.     Pulses: Normal pulses.     Heart sounds: Normal heart sounds. No murmur heard. No friction rub.  Pulmonary:     Effort: Pulmonary effort is normal.     Breath sounds: Normal breath sounds. No wheezing or rales.  Abdominal:     General: Bowel sounds are normal. There is no distension.     Palpations: Abdomen is soft.     Tenderness: There is no  abdominal tenderness. There is no guarding or rebound.  Musculoskeletal:        General: No tenderness. Normal range of motion.     Cervical back: Normal range of motion and neck supple.     Right lower leg: No edema.     Left lower leg: No edema.     Comments: No edema  Skin:    General: Skin is warm and dry.     Capillary Refill: Capillary refill takes less than 2 seconds.     Findings: No rash.     Comments: No pronator drift bilateral, sensation intact.  Normal speech without aphasia. 5/5 strength in all 4 ext.    Neurological:  Mental Status: She is alert and oriented to person, place, and time.     Cranial Nerves: No cranial nerve deficit.  Psychiatric:        Behavior: Behavior normal.        Thought Content: Thought content normal.     ED Results / Procedures / Treatments   Labs (all labs ordered are listed, but only abnormal results are displayed) Labs Reviewed  COMPREHENSIVE METABOLIC PANEL - Abnormal; Notable for the following components:      Result Value   Glucose, Bld 210 (*)    BUN 33 (*)    Creatinine, Ser 1.34 (*)    GFR, Estimated 40 (*)    All other components within normal limits  CBC WITH DIFFERENTIAL/PLATELET    EKG EKG Interpretation  Date/Time:  Friday Jan 14 2021 15:52:53 EDT Ventricular Rate:  80 PR Interval:  208 QRS Duration: 142 QT Interval:  404 QTC Calculation: 466 R Axis:   -26 Text Interpretation: Sinus rhythm Right bundle branch block 12 Lead; [place] No significant change since last tracing Confirmed by Blanchie Dessert [phone]) on 01/14/2021 5:51:45 PM   Radiology No results found.  Procedures Procedures   Medications Ordered in ED Medications - No data to display  ED Course  I have reviewed the triage vital signs and the nursing notes.  Pertinent labs & imaging results that were available during my care of the patient were reviewed by me and considered in my medical decision making (see chart for details).     MDM Rules/Calculators/A&P                          Patient is an 80 year old female presenting today with hypertension.  Patient has no other symptoms.  Patient recently had MRI 10 days ago due to ongoing weakness in her right hand but no evidence of recent stroke.  Today patient's neurologic exam is at baseline.  There is no focal symptoms concerning for stroke.  Patient does have a history of CKD.  Will check renal function to ensure no acute changes.  Patient has been taking her medications.  Here blood pressure is 174/95.  When looking back through prior records she seems to run in the 150s over 80s.  Patient reports that she has been eating and drinking.  Her diet has not significantly changed.  6:55 PM Patient's labs are reassuring with stable creatinine of 1.34, normal hemoglobin, EKG without changes with chronic right bundle branch block.  On recheck blood pressure is 187/74.  She does take Coreg twice daily.  She missed her 5 PM dose and will give that now and recheck in 1 hour.  She is otherwise well-appearing and feel that she can have blood pressure medication adjusted by PCP in the near future.  9:30 PM BP initially improved but pt now becoming agitated because she wants to go home.  noone can get [place] ptar to get  Home.  Pt needs to f/u with pcp about BP med changes.  Continue on current therapy at this time.  MDM Number of Diagnoses or Management Options   Amount and/or Complexity of Data Reviewed Clinical lab tests: ordered and reviewed Tests in the medicine section of CPT: ordered and reviewed Independent visualization of images, tracings, or specimens: yes     Final Clinical Impression(s) / ED Diagnoses Final diagnoses:  Hypertension, unspecified type    Rx / DC  Orders ED Discharge Orders    None       Blanchie Dessert, MD 01/14/21 2132

## 2021-01-14 NOTE — ED Notes (Signed)
[facility] called for transport.  

## 2021-01-14 NOTE — ED Notes (Signed)
Updated daughter, Einar Grad, via phone

## 2021-01-14 NOTE — Discharge Instructions (Signed)
No signs of stroke today.  Kidney function was normal.  Labs otherwise looked normal.  The blood pressure medication needs to be adjusted by primary doctor.

## 2021-01-15 NOTE — ED Notes (Signed)
Pt discharged from this ED in stable condition at this time. All discharge instructions and follow up care reviewed with pt with no further questions at this time.Pt sent back to [place] via ptar

## 2021-01-20 ENCOUNTER — Other Ambulatory Visit: Payer: Self-pay

## 2021-01-20 ENCOUNTER — Encounter (HOSPITAL_BASED_OUTPATIENT_CLINIC_OR_DEPARTMENT_OTHER): Payer: Medicare PPO | Attending: Internal Medicine | Admitting: Internal Medicine

## 2021-01-20 ENCOUNTER — Telehealth: Payer: Self-pay | Admitting: *Deleted

## 2021-01-20 DIAGNOSIS — I872 Venous insufficiency (chronic) (peripheral): Secondary | ICD-10-CM | POA: Diagnosis not present

## 2021-01-20 DIAGNOSIS — L97822 Non-pressure chronic ulcer of other part of left lower leg with fat layer exposed: Secondary | ICD-10-CM | POA: Insufficient documentation

## 2021-01-20 DIAGNOSIS — E10621 Type 1 diabetes mellitus with foot ulcer: Secondary | ICD-10-CM | POA: Diagnosis not present

## 2021-01-20 DIAGNOSIS — I70244 Atherosclerosis of native arteries of left leg with ulceration of heel and midfoot: Secondary | ICD-10-CM | POA: Insufficient documentation

## 2021-01-20 DIAGNOSIS — L89620 Pressure ulcer of left heel, unstageable: Secondary | ICD-10-CM | POA: Insufficient documentation

## 2021-01-20 DIAGNOSIS — I4892 Unspecified atrial flutter: Secondary | ICD-10-CM

## 2021-01-20 NOTE — Telephone Encounter (Signed)
reviewed monitor data.  The patient has a history of recurrent stroke.  Monitor shows atrial flutter.  The patient has not established with cardiology.  I recommended initiation of of Eliquis 5 mg twice daily based on her weight and creatinine clearance.  Will request an appointment in the atrial fib clinic. She is on plavix and had peripheral stenting earlier this year by Dr Stanford Breed. I will reach out to him to see if she needs to stay on this or whether we could switch her to low dose ASA since Eliquis will be started.

## 2021-01-20 NOTE — Telephone Encounter (Addendum)
Called to review Dr. Antionette Char (DOD) plan.  Left message to call back.

## 2021-01-20 NOTE — Telephone Encounter (Signed)
Monitor alert received for 01/20/2021 2:48pm showing Aflutter.  Pt currently on Plavix with history of CVA.  Alert taken to Dr Burt Knack for review and recommendation.

## 2021-01-20 NOTE — Progress Notes (Signed)
Dorothy, Barker ([id number]) Visit Report for 01/20/2021 Debridement Details Patient Name: Date of Service: Dorothy Barker, Dorothy Barker 01/20/2021 10:15 A M Medical Record Number: [id number] Patient Account Number: [id number] Date of Birth/Sex: Treating RN: 08/23/1940 (80 y.o. Helene Shoe, Meta.Reding Primary Care Provider: PCP, NO Other Clinician: Referring Provider: Treating Provider/Extender: Linton Ham MO RGA N, RUFFIN Weeks in Treatment: 12 Debridement Performed for Assessment: Wound #1 Left Calcaneus Performed By: Physician Ricard Dillon., MD Debridement Type: Debridement Severity of Tissue Pre Debridement: Fat layer exposed Level of Consciousness (Pre-procedure): Awake and Alert Pre-procedure Verification/Time Out Yes - 10:47 Taken: Start Time: 10:48 Pain Control: Lidocaine 4% T opical Solution T Area Debrided (L x W): otal 1.8 (cm) x 2.5 (cm) = 4.5 (cm) Tissue and other material debrided: Non-Viable, Eschar Level: Non-Viable Tissue Debridement Description: Selective/Open Wound Instrument: Curette Bleeding: None Hemostasis Achieved: Pressure End Time: 10:50 Procedural Pain: 0 Post Procedural Pain: 0 Response to Treatment: Procedure was tolerated well Level of Consciousness (Post- Awake and Alert procedure): Post Debridement Measurements of Total Wound Length: (cm) 1.8 Stage: Unstageable/Unclassified Width: (cm) 2.5 Depth: (cm) 0.1 Volume: (cm) 0.353 Character of Wound/Ulcer Post Debridement: Requires Further Debridement Severity of Tissue Post Debridement: Fat layer exposed Post Procedure Diagnosis Same as Pre-procedure Electronic Signature(s) Signed: 01/20/2021 4:50:31 PM By: Linton Ham MD Signed: 01/20/2021 5:16:10 PM By: Deon Pilling Entered By: Linton Ham on 01/20/2021 11:09:46 -------------------------------------------------------------------------------- HPI Details Patient Name: Date of Service: Dorothy Barker 01/20/2021 10:15 A M Medical Record Number:  [id number] Patient Account Number: [id number] Date of Birth/Sex: Treating RN: 07-27-1941 (80 y.o. Dorothy Barker Primary Care Provider: PCP, NO Other Clinician: Referring Provider: Treating Provider/Extender: Linton Ham MO RGA N, RUFFIN Weeks in Treatment: 12 History of Present Illness HPI Description: 10/26/2020 upon evaluation today patient appears to be doing somewhat poorly in regard to wounds on her left heel and left lower leg. The leg is really not nearly as bad as the heel ulcer however. This is an unstageable pressure ulcer unfortunately that occurred when the patient was in the hospital secondary to a stroke. At this point the patient is seen with her daughter as well and both are able to answer and respond to questions. With that being said that the biggest issue right now is that the eschar on the heel is not really stable I think that this is going to need to be addressed with more specific and appropriate dressings currently. Apparently she did see a podiatrist more recently and they did perform some sharp debridement to clear away necrotic tissue here on the heel. T be honest the patient's daughter is not extremely pleased about that she states that they heal was completely dry and stable at that point. With that o being said obviously I did not see anything at that time and I cannot really speak to what the heel look like and then but right now it definitely appears to be much softer and I do not think just Betadine is good to do the job. I think we have to focus on trying to get the eschar off of the heel at this point. The patient is in a assisted living facility currently. With that being said the daughter also questions whether or not she is going require skilled nursing. She does have a history of diabetes mellitus type 2. She also has a history of chronic venous insufficiency. 3/15; patient admitted to the clinic last week. She has an unstageable pressure ulcer on the  left heel that apparently occurred at [facility] when she was recovering from the stroke. She spent some time at [facility] and now is in [place] assisted living. She has home health twice a week we have been using Iodoflex on this area. It was pointed out last week that she does not have palpable pulses below the femoral we ordered arterial studies but her daughter has not heard anything about this we will recheck this. 3/22; unstageable pressure ulcer on the left heel and an area on her left posterior calf. She had her this showed an ABI in the left of 1.15 however monophasic waveforms in the great toe TBI of 0.18 with a pressure of 39. Doppler analysis revealed monophasic waveforms at the CFA which by my way of thinking might suggest proximal disease she had a 50-74 stenosis of the distal SFA with a focal velocity elevation of 357 cm/s. We have been using Iodoflex to both wound areas. The patient does not really complain of a lot of pain but she is not that ambulatory. 3/29 still unstageable area with eschar on the tip of the left heel. This is gradually separating. We have a vascular consult on April 5 4/21; patient was last seen almost 1 month ago. She was seen by Dr. Roselie Awkward and had a angiogram of the left lower extremity with above-the-knee popliteal artery stenting. Patient states she feels well and has no complaints today. 5/3 the patient had an angiogram I have not reviewed this. Per Dr. Jodene Nam last note she had a stent to the left popliteal artery but I do not know the condition of the tibial vessels. 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 Fortunately the posterior  tibial is the dominant flow to the foot. Her popliteal artery was stented. She followed up yesterday her ABI on the left was 1.23 monophasic waveforms but the TBI increased from 0.18-0.54 which is a major improvement. She has an area on the tip of her left heel and a small area on the left medial lower leg 6/2; the patient has been revascularized as noted above. She is using Santyl on the left heel. The area on the left medial lower leg is closed. Still debris which was ischemic eschar on the left heel. I crosshatched this 3 weeks ago this is gradually been falling off. Electronic Signature(s) Signed: 01/20/2021 4:50:31 PM By: Linton Ham MD Entered By: Linton Ham on 01/20/2021 11:09:26 -------------------------------------------------------------------------------- Physical Exam Details Patient Name: Date of Service: Dorothy Barker 01/20/2021 10:15 A M Medical Record Number: [id number] Patient Account Number: [id number] Date of Birth/Sex: Treating RN: 04-27-1941 (80 y.o. Dorothy Barker Primary Care Provider: PCP, NO Other Clinician: Referring Provider: Treating Provider/Extender: Linton Ham MO RGA N, RUFFIN Weeks in Treatment: 12 Constitutional Patient is hypertensive.. Pulse regular and within target range for patient.Marland Kitchen Respirations regular, non-labored and within target range.. Temperature is normal and within the target range for the patient.Marland Kitchen Appears in no distress. Notes Wound exam; left heel I used a #5 curette to remove some of the separating eschar. There is still more to go the tissue underneath does not look unhealthy as far as I can  tell. No evidence of surrounding infection Electronic Signature(s) Signed: 01/20/2021 4:50:31 PM By: Linton Ham MD Entered By: Linton Ham on 01/20/2021 11:10:27 -------------------------------------------------------------------------------- Physician Orders Details Patient Name: Date of Service: Dorothy Barker 01/20/2021  10:15 A M Medical Record Number: [id number] Patient Account Number: [id number] Date of Birth/Sex: Treating RN: [birth year] (80 y.o. 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 - Dr. Dellia Nims Return A Bathing/ Shower/ Hygiene May shower with protection but do not get wound dressing(s) wet. - patient may shower with cast protector with normal showering schedule. patient may wash with soap and water wound, leg, and foot with dressing changes. Edema Control - Lymphedema / SCD / Other Elevate legs to the level of the heart or above for 30 minutes daily and/or when sitting, a frequency of: Avoid standing for long periods of time. Moisturize legs daily. Off-Loading Other: - offloading shoe for left heel when pt. is up and walking. Pt. may wear pressure-relieving boots at bedtime. Patient may walk with PT as long as patient is wearing offloading shoe. Home Health No change in wound care orders this week; continue Home Health for wound care. May utilize formulary equivalent dressing for wound treatment orders unless otherwise specified. [facility] home health Monday's, Wednesday's, and Friday's. Every other Tuesday at wound center and all other days daughter to change. Wound Treatment Wound #1 - Calcaneus Wound Laterality: Left Cleanser: Soap and Water (Home Health) 1 x Per SWF/09 Days Discharge Instructions: May shower and wash  wound with dial antibacterial soap and water prior to dressing change. Cleanser: Wound Cleanser (Home Health) 1 x Per Day/15 Days Discharge Instructions: Cleanse the wound with wound cleanser prior to applying a clean dressing using gauze sponges, not tissue or cotton balls. Prim Dressing: Santyl Ointment (Home Health) 1 x Per Day/15 Days ary Discharge Instructions: Apply nickel thick amount to wound bed as instructed Secondary Dressing: Woven Gauze Sponge, Non-Sterile 4x4 in (Home Health) 1 x Per Day/15 Days Discharge Instructions: Apply over primary dressing as directed. Secondary Dressing: ABD Pad, 5x9 (Home Health) 1 x Per Day/15 Days Discharge Instructions: Apply over primary dressing as directed. Secured With: [employer], 4.5x3.1 (in/yd) (Home Health) 1 x Per Day/15 Days Discharge Instructions: Secure with Kerlix as directed. Secured With: 85M Medipore H Soft Cloth Surgical T 4 x 2 (in/yd) (Home Health) 1 x Per Day/15 Days ape Discharge Instructions: Secure dressing with tape as directed. Electronic Signature(s) Signed: 01/20/2021 4:50:31 PM By: Linton Ham MD Signed: 01/20/2021 5:16:10 PM By: Deon Pilling Entered By: Deon Pilling on 01/20/2021 10:51:38 -------------------------------------------------------------------------------- Problem List Details Patient Name: Date of Service: Dorothy Barker 01/20/2021 10:15 A M Medical Record Number: [id number] Patient Account Number: [id number] Date of Birth/Sex: Treating RN: 05/05/1941 (80 y.o. 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 Dorothy Barker Primary Care Provider: PCP, NO Other Clinician: Referring Provider: Treating Provider/Extender: Linton Ham MO RGA N, RUFFIN Weeks in Treatment: 12 Subjective History of Present Illness (HPI) 10/26/2020 upon evaluation today patient appears to be doing somewhat poorly in regard to wounds on her left heel and left lower leg. The leg is really not nearly as bad as the heel ulcer however. This is an unstageable pressure ulcer unfortunately that occurred when the patient was in the hospital secondary to a stroke. At this point the patient is seen with her daughter as well and both are able to answer and respond to questions. With that being said that the biggest issue right now is that the eschar on the heel is not really stable I think that this is going to need to be addressed with more specific and appropriate dressings currently. Apparently she did see a podiatrist more recently and they did perform some sharp debridement to clear away necrotic tissue here on the heel. T be honest o the patient's daughter is not extremely pleased about that she states that they heal was completely dry and stable at that point. With that being said obviously I did not see anything at that time and I cannot really speak to what the  heel look like and then but right now it definitely appears to be much softer and I do not think just Betadine is good to do the job. I think we have to focus on trying to get the eschar off of the heel at this point. The patient is in a assisted living facility currently. With that being said the daughter also questions whether or not she is going require skilled nursing. She does have a history of diabetes mellitus type 2. She also has a history of chronic venous insufficiency. 3/15; patient admitted to the clinic last week. She has an unstageable pressure ulcer on the left heel that apparently occurred at [facility] when she was recovering from the stroke. She spent some time at the rehab unit of [facility] and now is in [facility] assisted living. She has home health twice a week we have been using Iodoflex on this area. It was pointed out last week that she does not have palpable pulses below the femoral we ordered arterial studies but her daughter has not heard anything about this we will recheck this. 3/22; unstageable pressure ulcer on the left heel and an area on her left posterior calf. She had her this showed an ABI in the left of 1.15 however monophasic waveforms in the great toe TBI of 0.18 with a pressure of 39. Doppler analysis revealed monophasic waveforms at the CFA which by my way of thinking might suggest proximal disease she had a 50-74 stenosis of the distal SFA with a focal velocity elevation of 357 cm/s. We have been using Iodoflex to both  wound areas. The patient does not really complain of a lot of pain but she is not that ambulatory. 3/29 still unstageable area with eschar on the tip of the left heel. This is gradually separating. We have a vascular consult on April 5 4/21; patient was last seen almost 1 month ago. She was seen by Dr. Roselie Awkward and had a angiogram of the left lower extremity with above-the-knee popliteal artery stenting. Patient states she feels well and  has no complaints today. 5/3 the patient had an angiogram I have not reviewed this. Per Dr. Jodene Nam last note she had a stent to the left popliteal artery but I do not know the condition of the tibial vessels. 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 Fortunately the posterior tibial is the dominant flow to the foot. Her popliteal artery was stented. She followed up yesterday her ABI on the left was 1.23 monophasic waveforms but the TBI increased from 0.18-0.54 which is a major improvement. She has an area on the tip of her left heel and a small area on the left medial lower leg 6/2; the patient has been revascularized as noted above. She is using Santyl on the left heel. The area on the left medial lower leg is closed. Still debris which was ischemic eschar on the left heel. I crosshatched this 3 weeks ago this is gradually been falling off. Objective Constitutional Patient is hypertensive.. Pulse regular and within target range for patient.Marland Kitchen Respirations regular, non-labored and within target range.. Temperature is normal and within the target range for the patient.Marland Kitchen Appears in no distress. Vitals Time Taken: 10:15 AM, Height: 62 in, Weight: 173 lbs, BMI: 31.6, Temperature: 98.2 F, Pulse: 74 bpm, Respiratory Rate: 18 breaths/min, Blood Pressure: 161/79 mmHg. General Notes: Wound exam; left heel I used a #5 curette to remove some of the separating eschar. There is still more to go the tissue underneath does not look unhealthy as far as I can tell. No evidence of surrounding infection Integumentary (Hair, Skin) Wound #1 status is Open. Original cause of wound was Pressure Injury. The date acquired  was: 09/21/2020. The wound has been in treatment 12 weeks. The wound is located on the Left Calcaneus. The wound measures 1.8cm length x 2.5cm width x 0.1cm depth; 3.534cm^2 area and 0.353cm^3 volume. There is Fat Layer (Subcutaneous Tissue) exposed. There is no tunneling or undermining noted. There is a small amount of serous drainage noted. The wound margin is distinct with the outline attached to the wound base. There is no granulation within the wound bed. There is a large (67-100%) amount of necrotic tissue within the wound bed including Eschar. Wound #2 status is Open. Original cause of wound was Pressure Injury. The date acquired was: 10/19/2020. The wound has been in treatment 12 weeks. The wound is located on the Left,Medial Lower Leg. The wound measures 0cm length x 0cm width x 0cm depth; 0cm^2 area and 0cm^3 volume. 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 There was a Selective/Open Wound Non-Viable Tissue Debridement with a total area of 4.5 sq cm performed by Ricard Dillon., MD. With the following instrument(s): Curette to remove Non-Viable tissue/material. Material removed includes Eschar after achieving pain control using Lidocaine 4% T opical Solution. A time out was conducted at 10:47, prior to the start of the procedure. There was no bleeding. The procedure was tolerated well with a pain level of 0 throughout and a pain level of 0 following the procedure. Post Debridement Measurements: 1.8cm length x 2.5cm width x 0.1cm depth; 0.353cm^3  volume. Post debridement Stage noted as Unstageable/Unclassified. Character of Wound/Ulcer Post Debridement requires further debridement. Severity of Tissue Post Debridement is: Fat layer exposed. Post procedure Diagnosis Wound #1: Same as Pre-Procedure Plan Follow-up Appointments: Return Appointment in 2 weeks. - Dr. Dellia Nims Bathing/ Shower/ Hygiene: May shower with protection but do not get wound dressing(s) wet. - patient may shower with cast protector with normal showering schedule. patient may wash with soap and water wound, leg, and foot with dressing changes. Edema Control - Lymphedema / SCD / Other: Elevate legs to the level of the heart or above for 30 minutes daily and/or when sitting, a frequency of: Avoid standing for long periods of time. Moisturize legs daily. Off-Loading: Other: - offloading shoe for left heel when pt. is up and walking. Pt. may wear pressure-relieving boots at bedtime. Patient may walk with PT as long as patient is wearing offloading shoe. Home Health: No change in wound care orders this week; continue Home Health for wound care. May utilize formulary equivalent dressing for wound treatment orders unless otherwise specified. [facility] home health Monday's, Wednesday's, and Friday's. Every other Tuesday at wound center and all other days daughter to change. WOUND #1: - Calcaneus Wound Laterality: Left Cleanser: Soap and Water (Home Health) 1 x Per MWU/13 Days Discharge Instructions: May shower and wash wound with dial antibacterial soap and water prior to dressing change. Cleanser: Wound Cleanser (Home Health) 1 x Per Day/15 Days Discharge Instructions: Cleanse the wound with wound cleanser prior to applying a clean dressing using gauze sponges, not tissue or cotton balls. Prim Dressing: Santyl Ointment (Home Health) 1 x Per Day/15 Days ary Discharge Instructions: Apply nickel thick amount to wound bed as instructed Secondary Dressing: Woven Gauze Sponge,  Non-Sterile 4x4 in (Home Health) 1 x Per Day/15 Days Discharge Instructions: Apply over primary dressing as directed. Secondary Dressing: ABD Pad, 5x9 (Home Health) 1 x Per Day/15 Days Discharge Instructions: Apply over primary dressing as directed. Secured With: [employer], 4.5x3.1 (in/yd) (Home Health) 1 x Per Day/15 Days Discharge Instructions: Secure with Kerlix as directed. Secured With: 74M Medipore H Soft Cloth Surgical T 4 x 2 (in/yd) (Home Health) 1 x Per Day/15 Days ape Discharge Instructions: Secure dressing with tape as directed. 1. Debridement as noted 2. Continue Santyl change daily 3. The area on the left medial lower leg is healed Electronic Signature(s) Signed: 01/20/2021 4:50:31 PM By: Linton Ham MD Entered By: Linton Ham on 01/20/2021 11:10:59 -------------------------------------------------------------------------------- SuperBill Details Patient Name: Date of Service: Dorothy Barker 01/20/2021 Medical Record Number: [id number] Patient Account Number: [id number] Date of Birth/Sex: Treating RN: 01/02/1941 (79 y.o. 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

## 2021-01-20 NOTE — Telephone Encounter (Signed)
preventice called to report a critical EKG for patient

## 2021-01-21 MED ORDER — APIXABAN 5 MG PO TABS: 5.0000 mg | ORAL_TABLET | Freq: Two times a day (BID) | ORAL | 11 refills | Status: DC

## 2021-01-21 NOTE — Telephone Encounter (Signed)
PT is returning a call 

## 2021-01-21 NOTE — Telephone Encounter (Signed)
Spoke to the patient's daughter Angela Nevin. Gave report with recommendations from Dr. Burt Knack. Daughter verbalized understanding. Medication sent to requested pharmacy.

## 2021-01-21 NOTE — Telephone Encounter (Signed)
Left message to call back. Details given that we got a monitor report and Dr. Burt Knack would like to start a new mediation.

## 2021-01-21 NOTE — Progress Notes (Signed)
FAYTH, TREFRY ([id number]) Visit Report for 01/20/2021 Arrival Information Details Patient Name: Date of Service: TERRY, ABILA 01/20/2021 10:15 A M Medical Record Number: [id number] Patient Account Number: [id number] Date of Birth/Sex: Treating RN: 01-22-1941 (80 y.o. [name] Primary Care Brandelyn Henne: PCP, NO Other Clinician: Referring Berlin Viereck: Treating Arrielle Mcginn/Extender: Linton Ham MO RGA N, RUFFIN Weeks in Treatment: 12 Visit Information History Since Last Visit Added or deleted any medications: No Patient Arrived: Wheel Chair Any new allergies or adverse reactions: No Arrival Time: 10:14 Had a fall or experienced change in No Accompanied By: caregiver activities of daily living that may affect Transfer Assistance: None risk of falls: Patient Identification Verified: Yes Signs or symptoms of abuse/neglect since last visito No Secondary Verification Process Completed: Yes Hospitalized since last visit: No Patient Requires Transmission-Based Precautions: No Implantable device outside of the clinic excluding No Patient Has Alerts: Yes cellular tissue based products placed in the center Patient Alerts: Patient on Blood Thinner since last visit: Has Dressing in Place as Prescribed: Yes Pain Present Now: No Electronic Signature(s) Signed: 01/20/2021 1:41:47 PM By: Sandre Kitty Entered By: Sandre Kitty on 01/20/2021 10:15:17 -------------------------------------------------------------------------------- Encounter Discharge Information Details Patient Name: Date of Service: [name] 01/20/2021 10:15 A M Medical Record Number: [id number] Patient Account Number: [id number] Date of Birth/Sex: Treating RN: 03/08/1941 (80 y.o. Elam Dutch Primary Care Courtni Balash: PCP, NO Other Clinician: Referring Papa Piercefield: Treating Corina Stacy/Extender: Linton Ham MO RGA N, RUFFIN Weeks in Treatment: 12 Encounter Discharge Information Items Post Procedure  Vitals Discharge Condition: Stable Temperature (F): 98.2 Ambulatory Status: Wheelchair Pulse (bpm): 74 Discharge Destination: Home Respiratory Rate (breaths/min): 18 Transportation: Private Auto Blood Pressure (mmHg): 161/79 Accompanied By: daughter Schedule Follow-up Appointment: Yes Clinical Summary of Care: Patient Declined Electronic Signature(s) Signed: 01/20/2021 5:38:56 PM By: Baruch Gouty RN, BSN Entered By: Baruch Gouty on 01/20/2021 11:06:32 -------------------------------------------------------------------------------- Lower Extremity Assessment Details Patient Name: Date of Service: [name] 01/20/2021 10:15 A M Medical Record Number: [id number] Patient Account Number: [id number] Date of Birth/Sex: Treating RN: 1941-05-24 (80 y.o. Benjaman Lobe Primary Care Yaniah Thiemann: PCP, NO Other Clinician: Referring Geoge Lawrance: Treating Jemina Scahill/Extender: Linton Ham MO RGA N, RUFFIN Weeks in Treatment: 12 Edema Assessment Assessed: [Left: Yes] [Right: No] Edema: [Left: Ye] [Right: s] Calf Left: Right: Point of Measurement: 28 cm From Medial Instep 32 cm Ankle Left: Right: Point of Measurement: 8 cm From Medial Instep 20.8 cm Vascular Assessment Pulses: Dorsalis Pedis Palpable: [Left:Yes] Posterior Tibial Palpable: [Left:Yes] Electronic Signature(s) Signed: 01/20/2021 12:00:56 PM By: Rhae Hammock RN Entered By: Rhae Hammock on 01/20/2021 10:38:40 -------------------------------------------------------------------------------- Multi Wound Chart Details Patient Name: Date of Service: [name] 01/20/2021 10:15 A M Medical Record Number: [id number] Patient Account Number: [id number] Date of Birth/Sex: Treating RN: 03/11/1941 (80 y.o. [name] Primary Care Deontrae Drinkard: PCP, NO Other Clinician: Referring Kery Batzel: Treating Vickey Ewbank/Extender: Linton Ham MO RGA N, RUFFIN Weeks in Treatment: 12 Vital Signs Height(in):  79 Pulse(bpm): 18 Weight(lbs): 173 Blood Pressure(mmHg): 161/79 Body Mass Index(BMI): 32 Temperature(F): 98.2 Respiratory Rate(breaths/min): 18 Photos: [1:No Photos Left Calcaneus] [2:No Photos Left, Medial Lower Leg] [N/A:N/A N/A] Wound Location: [1:Pressure Injury] [2:Pressure Injury] [N/A:N/A] Wounding Event: [1:Pressure Ulcer] [2:Pressure Ulcer] [N/A:N/A] Primary Etiology: [1:Diabetic Wound/Ulcer of the Lower] [2:N/A] [N/A:N/A] Secondary Etiology: [1:Extremity Cataracts, Hypertension, Type II] [2:N/A] [N/A:N/A] Comorbid History: [1:Diabetes, Osteoarthritis, Dementia 09/21/2020] [2:10/19/2020] [N/A:N/A] Date Acquired: [1:12] [2:12] [N/A:N/A] Weeks of Treatment: [1:Open] [2:Open] [N/A:N/A] Wound Status: [1:1.8x2.5x0.1] [2:0x0x0] [N/A:N/A] Measurements L x W x D (cm) [  1:3.534] [2:0] [N/A:N/A] A (cm) : rea [1:0.353] [2:0] [N/A:N/A] Volume (cm) : [1:10.70%] [2:100.00%] [N/A:N/A] % Reduction in A rea: [1:10.90%] [2:100.00%] [N/A:N/A] % Reduction in Volume: [1:Unstageable/Unclassified] [2:Unstageable/Unclassified] [N/A:N/A] Classification: [1:Small] [2:N/A] [N/A:N/A] Exudate A mount: [1:Serous] [2:N/A] [N/A:N/A] Exudate Type: [1:amber] [2:N/A] [N/A:N/A] Exudate Color: [1:Distinct, outline attached] [2:N/A] [N/A:N/A] Wound Margin: [1:None Present (0%)] [2:N/A] [N/A:N/A] Granulation A mount: [1:Large (67-100%)] [2:N/A] [N/A:N/A] Necrotic A mount: [1:Eschar] [2:N/A] [N/A:N/A] Necrotic Tissue: [1:Fat Layer (Subcutaneous Tissue): Yes N/A] [N/A:N/A] Exposed Structures: [1:Fascia: No Tendon: No Muscle: No Joint: No Bone: No Small (1-33%)] [2:N/A] [N/A:N/A] Epithelialization: [1:Debridement - Selective/Open Wound N/A] [N/A:N/A] Debridement: Pre-procedure Verification/Time Out 10:47 [2:N/A] [N/A:N/A] Taken: [1:Lidocaine 4% Topical Solution] [2:N/A] [N/A:N/A] Pain Control: [1:Necrotic/Eschar] [2:N/A] [N/A:N/A] Tissue Debrided: [1:Non-Viable Tissue] [2:N/A] [N/A:N/A] Level: [1:4.5] [2:N/A]  [N/A:N/A] Debridement A (sq cm): [1:rea Curette] [2:N/A] [N/A:N/A] Instrument: [1:None] [2:N/A] [N/A:N/A] Bleeding: [1:Pressure] [2:N/A] [N/A:N/A] Hemostasis A chieved: [1:0] [2:N/A] [N/A:N/A] Procedural Pain: [1:0] [2:N/A] [N/A:N/A] Post Procedural Pain: [1:Procedure was tolerated well] [2:N/A] [N/A:N/A] Debridement Treatment Response: [1:1.8x2.5x0.1] [2:N/A] [N/A:N/A] Post Debridement Measurements L x W x D (cm) [1:0.353] [2:N/A] [N/A:N/A] Post Debridement Volume: (cm) [1:Unstageable/Unclassified] [2:N/A] [N/A:N/A] Post Debridement Stage: [1:Debridement] [2:N/A] [N/A:N/A] Treatment Notes Wound #1 (Calcaneus) Wound Laterality: Left Cleanser Wound Cleanser Discharge Instruction: Cleanse the wound with wound cleanser prior to applying a clean dressing using gauze sponges, not tissue or cotton balls. Soap and Water Discharge Instruction: May shower and wash wound with dial antibacterial soap and water prior to dressing change. Peri-Wound Care Topical Primary Dressing Santyl Ointment Discharge Instruction: Apply nickel thick amount to wound bed as instructed Secondary Dressing Woven Gauze Sponge, Non-Sterile 4x4 in Discharge Instruction: Apply over primary dressing as directed. ABD Pad, 5x9 Discharge Instruction: Apply over primary dressing as directed. Secured With [employer], 4.5x3.1 (in/yd) Discharge Instruction: Secure with Kerlix as directed. 83M Medipore H Soft Cloth Surgical T 4 x 2 (in/yd) ape Discharge Instruction: Secure dressing with tape as directed. Compression Wrap Compression Stockings Add-Ons Wound #2 (Lower Leg) Wound Laterality: Left, Medial Cleanser Peri-Wound Care Topical Primary Dressing Secondary Dressing Secured With Compression Wrap Compression Stockings Add-Ons Electronic Signature(s) Signed: 01/20/2021 4:50:31 PM By: Linton Ham MD Signed: 01/20/2021 5:16:10 PM By: Deon Pilling Entered By: Linton Ham on 01/20/2021  11:08:29 -------------------------------------------------------------------------------- Multi-Disciplinary Care Plan Details Patient Name: Date of Service: [name] 01/20/2021 10:15 A M Medical Record Number: [id number] Patient Account Number: [id number] Date of Birth/Sex: Treating RN: [birth year] (80 y.o. [name] Primary Care Tanuj Mullens: PCP, NO Other Clinician: Referring Jacquilyn Seldon: Treating Elizabethann Lackey/Extender: Linton Ham MO RGA N, RUFFIN Weeks in Treatment: 12 Active Inactive Wound/Skin Impairment Nursing Diagnoses: Impaired tissue integrity Knowledge deficit related to ulceration/compromised skin integrity Goals: Patient will have a decrease in wound volume by X% from date: (specify in notes) Date Initiated: 10/26/2020 Target Resolution Date: 02/18/2021 Goal Status: Active Patient/caregiver will verbalize understanding of skin care regimen Date Initiated: 10/26/2020 Target Resolution Date: 02/03/2021 Goal Status: Active Ulcer/skin breakdown will have a volume reduction of 30% by week 4 Date Initiated: 10/26/2020 Date Inactivated: 12/09/2020 Target Resolution Date: 12/09/2020 Goal Status: Unmet Unmet Reason: no major changes. Interventions: Assess patient/caregiver ability to obtain necessary supplies Assess patient/caregiver ability to perform ulcer/skin care regimen upon admission and as needed Assess ulceration(s) every visit Notes: Electronic Signature(s) Signed: 01/20/2021 5:16:10 PM By: Deon Pilling Entered By: Deon Pilling on 01/20/2021 10:21:03 -------------------------------------------------------------------------------- Pain Assessment Details Patient Name: Date of Service: ALLISA, EINSPAHR 01/20/2021 10:15 A M Medical Record Number: [id number] Patient Account  Number: [id number] Date of Birth/Sex: Treating RN: September 19, 1940 (80 y.o. [name] Primary Care Maddelyn Rocca: PCP, NO Other Clinician: Referring Kendarius Vigen: Treating Delicia Berens/Extender: Linton Ham MO RGA N, RUFFIN Weeks in Treatment: 12 Active Problems Location of Pain Severity and Description of Pain Patient Has Paino No Site Locations Pain Management and Medication Current Pain Management: Electronic Signature(s) Signed: 01/20/2021 1:41:47 PM By: Sandre Kitty Signed: 01/20/2021 5:16:10 PM By: Deon Pilling Entered By: Sandre Kitty on 01/20/2021 10:15:36 -------------------------------------------------------------------------------- Patient/Caregiver Education Details Patient Name: Date of Service: [name] 6/2/2022andnbsp10:15 [place] Record Number: [id number] Patient Account Number: [id number] Date of Birth/Gender: Treating RN: 1941/08/19 (80 y.o. [name] Primary Care Physician: PCP, NO Other Clinician: Referring Physician: Treating Physician/Extender: Linton Ham MO RGA N, RUFFIN Weeks in Treatment: 12 Education Assessment Education Provided To: Patient Education Topics Provided Wound/Skin Impairment: Handouts: Skin Care Do's and Dont's Methods: Explain/Verbal Responses: Reinforcements needed Electronic Signature(s) Signed: 01/20/2021 5:16:10 PM By: Deon Pilling Entered By: Deon Pilling on 01/20/2021 10:21:13 -------------------------------------------------------------------------------- Wound Assessment Details Patient Name: Date of Service: MATIE, DIMAANO 01/20/2021 10:15 A M Medical Record Number: [id number] Patient Account Number: [id number] Date of Birth/Sex: Treating RN: [birth year] (80 y.o. Helene Shoe, Meta.Reding Primary Care Jahzaria Vary: PCP, NO Other Clinician: Referring Dima Ferrufino: Treating Owens Hara/Extender: Linton Ham MO RGA N, RUFFIN Weeks in Treatment: 12 Wound Status Wound Number: 1 Primary Etiology: Pressure Ulcer Wound Location: Left Calcaneus Secondary Diabetic Wound/Ulcer of the Lower Extremity Etiology: Wounding Event: Pressure Injury Wound Status: Open Date Acquired: 09/21/2020 Comorbid Cataracts,  Hypertension, Type II Diabetes, Osteoarthritis, Weeks Of Treatment: 12 History: Dementia Clustered Wound: No Photos Wound Measurements Length: (cm) 1.8 Width: (cm) 2.5 Depth: (cm) 0.1 Area: (cm) 3.534 Volume: (cm) 0.353 % Reduction in Area: 10.7% % Reduction in Volume: 10.9% Epithelialization: Small (1-33%) Tunneling: No Undermining: No Wound Description Classification: Unstageable/Unclassified Wound Margin: Distinct, outline attached Exudate Amount: Small Exudate Type: Serous Exudate Color: amber Foul Odor After Cleansing: No Slough/Fibrino No Wound Bed Granulation Amount: None Present (0%) Exposed Structure Necrotic Amount: Large (67-100%) Fascia Exposed: No Necrotic Quality: Eschar Fat Layer (Subcutaneous Tissue) Exposed: Yes Tendon Exposed: No Muscle Exposed: No Joint Exposed: No Bone Exposed: No Treatment Notes Wound #1 (Calcaneus) Wound Laterality: Left Cleanser Wound Cleanser Discharge Instruction: Cleanse the wound with wound cleanser prior to applying a clean dressing using gauze sponges, not tissue or cotton balls. Soap and Water Discharge Instruction: May shower and wash wound with dial antibacterial soap and water prior to dressing change. Peri-Wound Care Topical Primary Dressing Santyl Ointment Discharge Instruction: Apply nickel thick amount to wound bed as instructed Secondary Dressing Woven Gauze Sponge, Non-Sterile 4x4 in Discharge Instruction: Apply over primary dressing as directed. ABD Pad, 5x9 Discharge Instruction: Apply over primary dressing as directed. Secured With [employer], 4.5x3.1 (in/yd) Discharge Instruction: Secure with Kerlix as directed. 92M Medipore H Soft Cloth Surgical T 4 x 2 (in/yd) ape Discharge Instruction: Secure dressing with tape as directed. Compression Wrap Compression Stockings Add-Ons Electronic Signature(s) Signed: 01/20/2021 5:16:10 PM By: Deon Pilling Signed: 01/21/2021 5:48:41 PM By: Sandre Kitty Previous Signature: 01/20/2021 12:00:56 PM Version By: Rhae Hammock RN Entered By: Sandre Kitty on 01/20/2021 17:04:54 -------------------------------------------------------------------------------- Wound Assessment Details Patient Name: Date of Service: [name] 01/20/2021 10:15 A M Medical Record Number: [id number] Patient Account Number: [id number] Date of Birth/Sex: Treating RN: March 29, 1941 (80 y.o. [name] Primary Care Valen Mascaro: PCP, NO Other Clinician: Referring Cletus Paris: Treating Malaquias Lenker/Extender: Linton Ham MO Arley,  RUFFIN Weeks in Treatment: 12 Wound Status Wound Number: 2 Primary Etiology: Pressure Ulcer Wound Location: Left, Medial Lower Leg Wound Status: Open Wounding Event: Pressure Injury Date Acquired: 10/19/2020 Weeks Of Treatment: 12 Clustered Wound: No Wound Measurements Length: (cm) Width: (cm) Depth: (cm) Area: (cm) Volume: (cm) 0 % Reduction in Area: 100% 0 % Reduction in Volume: 100% 0 0 0 Wound Description Classification: Unstageable/Unclassified Electronic Signature(s) Signed: 01/20/2021 1:41:47 PM By: Sandre Kitty Signed: 01/20/2021 5:16:10 PM By: Deon Pilling Entered By: Da Katrinka Blazing on 01/20/2021 10:21:17 -------------------------------------------------------------------------------- Vitals Details Patient Name: Date of Service: [name] 01/20/2021 10:15 A M Medical Record Number: [id number] Patient Account Number: [id number] Date of Birth/Sex: Treating RN: 24-Jan-1941 (80 y.o. Helene Shoe, Meta.Reding Primary Care Jacoby Zanni: PCP, NO Other Clinician: Referring Josalynn Johndrow: Treating Abrham Maslowski/Extender: Linton Ham MO RGA N, RUFFIN Weeks in Treatment: 12 Vital Signs Time Taken: 10:15 Temperature (F): 98.2 Height (in): 62 Pulse (bpm): 74 Weight (lbs): 173 Respiratory Rate (breaths/min): 18 Body Mass Index (BMI): 31.6 Blood Pressure (mmHg): 161/79 Reference Range: 80 - 120 mg /  dl Electronic Signature(s) Signed: 01/20/2021 1:41:47 PM By: Sandre Kitty Entered By: Sandre Kitty on 01/20/2021 10:15:31

## 2021-01-21 NOTE — Addendum Note (Signed)
Addended by: Darrell Jewel on: 01/21/2021 11:32 AM   Modules accepted: Orders

## 2021-01-25 NOTE — Telephone Encounter (Signed)
Will route to [employer] as [place]. The patient is scheduled with him 01/27/21.

## 2021-01-27 ENCOUNTER — Other Ambulatory Visit: Payer: Self-pay

## 2021-01-27 ENCOUNTER — Ambulatory Visit (HOSPITAL_COMMUNITY)
Admission: RE | Admit: 2021-01-27 | Discharge: 2021-01-27 | Disposition: A | Discharge status: Home / Self Care | Payer: Medicare PPO | Source: Ambulatory Visit | Attending: Physician Assistant | Admitting: Physician Assistant

## 2021-01-27 ENCOUNTER — Encounter (HOSPITAL_COMMUNITY): Payer: Self-pay | Admitting: Physician Assistant

## 2021-01-27 VITALS — BP 134/80 | HR 72 | Ht 60.0 in | Wt 176.2 lb

## 2021-01-27 DIAGNOSIS — E669 Obesity, unspecified: Secondary | ICD-10-CM | POA: Insufficient documentation

## 2021-01-27 DIAGNOSIS — D6869 Other thrombophilia: Secondary | ICD-10-CM | POA: Insufficient documentation

## 2021-01-27 DIAGNOSIS — Z6834 Body mass index (BMI) 34.0-34.9, adult: Secondary | ICD-10-CM | POA: Insufficient documentation

## 2021-01-27 DIAGNOSIS — Z888 Allergy status to other drugs, medicaments and biological substances status: Secondary | ICD-10-CM | POA: Insufficient documentation

## 2021-01-27 DIAGNOSIS — Z79899 Other long term (current) drug therapy: Secondary | ICD-10-CM | POA: Insufficient documentation

## 2021-01-27 DIAGNOSIS — Z886 Allergy status to analgesic agent status: Secondary | ICD-10-CM | POA: Diagnosis not present

## 2021-01-27 DIAGNOSIS — E1151 Type 2 diabetes mellitus with diabetic peripheral angiopathy without gangrene: Secondary | ICD-10-CM | POA: Insufficient documentation

## 2021-01-27 DIAGNOSIS — Z885 Allergy status to narcotic agent status: Secondary | ICD-10-CM | POA: Diagnosis not present

## 2021-01-27 DIAGNOSIS — Z7902 Long term (current) use of antithrombotics/antiplatelets: Secondary | ICD-10-CM | POA: Diagnosis not present

## 2021-01-27 DIAGNOSIS — I129 Hypertensive chronic kidney disease with stage 1 through stage 4 chronic kidney disease, or unspecified chronic kidney disease: Secondary | ICD-10-CM | POA: Insufficient documentation

## 2021-01-27 DIAGNOSIS — E785 Hyperlipidemia, unspecified: Secondary | ICD-10-CM | POA: Insufficient documentation

## 2021-01-27 DIAGNOSIS — Z794 Long term (current) use of insulin: Secondary | ICD-10-CM | POA: Insufficient documentation

## 2021-01-27 DIAGNOSIS — Z7982 Long term (current) use of aspirin: Secondary | ICD-10-CM | POA: Insufficient documentation

## 2021-01-27 DIAGNOSIS — I4892 Unspecified atrial flutter: Secondary | ICD-10-CM | POA: Insufficient documentation

## 2021-01-27 DIAGNOSIS — Z7901 Long term (current) use of anticoagulants: Secondary | ICD-10-CM | POA: Diagnosis not present

## 2021-01-27 DIAGNOSIS — Z8673 Personal history of transient ischemic attack (TIA), and cerebral infarction without residual deficits: Secondary | ICD-10-CM | POA: Diagnosis not present

## 2021-01-27 DIAGNOSIS — E1122 Type 2 diabetes mellitus with diabetic chronic kidney disease: Secondary | ICD-10-CM | POA: Insufficient documentation

## 2021-01-27 DIAGNOSIS — N189 Chronic kidney disease, unspecified: Secondary | ICD-10-CM | POA: Diagnosis not present

## 2021-01-27 MED ORDER — ASPIRIN 81 MG PO TBEC: 81.0000 mg | DELAYED_RELEASE_TABLET | Freq: Every morning | ORAL | Status: DC

## 2021-01-27 NOTE — Patient Instructions (Signed)
Stop Plavix  Continue Eliquis 5mg  twice a day and Aspirin 81mg  once a day    Referral placed to establish cardiology care in 2-3 months.

## 2021-01-27 NOTE — Progress Notes (Addendum)
Primary Care Physician: Pcp, No Primary Cardiologist: none Primary Electrophysiologist: none Referring Physician: Dr Eustace Quail is a 80 y.o. female with a history of CKD, DM, HLD, HTN, prior CVA, PVD, hypothyroidism, atrial flutter who presents for consultation in the [facility].  The patient was initially diagnosed with atrial flutter on a 30 day cardiac monitor after CVA. Patient was started on Eliquis for a CHADS2VASC score of 8. She was not aware of her arrhythmia at the time. She denies significant snoring or alcohol use.   Today, she denies symptoms of palpitations, chest pain, shortness of breath, orthopnea, PND, lower extremity edema, dizziness, presyncope, syncope, snoring, daytime somnolence, bleeding, or neurologic sequela. The patient is tolerating medications without difficulties and is otherwise without complaint today.    Atrial Fibrillation Risk Factors:  she does not have symptoms or diagnosis of sleep apnea. she does not have a history of rheumatic fever. she does not have a history of alcohol use.   she has a BMI of Body mass index is 34.41 kg/m.Marland Kitchen Filed Weights   01/27/21 1417  Weight: 79.9 kg    No family history on file.   Atrial Fibrillation Management history:  Previous antiarrhythmic drugs: none Previous cardioversions: none Previous ablations: none CHADS2VASC score: 8 Anticoagulation history: Eliquis   Past Medical History:  Diagnosis Date   Cancer ([place])    kidney   Chronic kidney disease    Diabetes mellitus without complication ([place])    Hyperlipidemia    Hypertension    Stroke [facility])    Thyroid disease    Vitamin D deficiency    Past Surgical History:  Procedure Laterality Date   ABDOMINAL AORTOGRAM W/LOWER EXTREMITY N/A 12/01/2020   Procedure: ABDOMINAL AORTOGRAM W/LOWER EXTREMITY;  Surgeon: Cherre Robins, MD;  Location: [place] [facility];  Service: Cardiovascular;  Laterality: N/A;    ABDOMINAL HYSTERECTOMY     KNEE SURGERY Right    NEPHRECTOMY Left    PERIPHERAL VASCULAR INTERVENTION Left 12/01/2020   Procedure: PERIPHERAL VASCULAR INTERVENTION;  Surgeon: Cherre Robins, MD;  Location: [place] [facility];  Service: Cardiovascular;  Laterality: Left;   TONSILLECTOMY      Current Outpatient Medications  Medication Sig Dispense Refill   acetaminophen (TYLENOL) 500 MG tablet Take 1,000 mg by mouth every 8 (eight) hours as needed for mild pain or moderate pain.     apixaban (ELIQUIS) 5 MG TABS tablet Take 1 tablet (5 mg total) by mouth 2 (two) times daily. 60 tablet 11   atorvastatin (LIPITOR) 40 MG tablet Take 40 mg by mouth at bedtime.     carvedilol (COREG) 6.25 MG tablet Take 6.25 mg by mouth 2 (two) times daily with a meal.     clopidogrel (PLAVIX) 75 MG tablet Take 1 tablet (75 mg total) by mouth daily. 30 tablet 11   Dulaglutide (TRULICITY) 2.02 RK/2.7CW SOPN Inject 0.75 mg into the skin every Thursday.     ergocalciferol (VITAMIN D2) 1.25 MG (50000 UT) capsule Take 50,000 Units by mouth every Thursday.     insulin glargine (LANTUS) 100 unit/mL SOPN Inject 16 Units into the skin at bedtime.     lisinopril-hydrochlorothiazide (ZESTORETIC) 20-12.5 MG tablet Take 2 tablets by mouth every morning.     thyroid (ARMOUR) 60 MG tablet Take 60 mg by mouth every morning.     No current facility-administered medications for this encounter.    Allergies  Allergen Reactions   Tramadol Other (  See Comments)    Syncopal episodes    Amlodipine Besy-Benazepril Hcl Rash   Codeine Rash   Naproxen Rash    Social History   Socioeconomic History   Marital status: Single    Spouse name: Not on file   Number of children: Not on file   Years of education: Not on file   Highest education level: Not on file  Occupational History   Not on file  Tobacco Use   Smoking status: Passive Smoke Exposure - Never Smoker   Smokeless tobacco: Never  Vaping Use   Vaping Use: Never used   Substance and Sexual Activity   Alcohol use: Never   Drug use: Never   Sexual activity: Not on file  Other Topics Concern   Not on file  Social History Narrative   Resides at [facility]   Right Handed   Drinks 1 cups caffeine daily   Social Determinants of Health   Financial Resource Strain: Not on file  Food Insecurity: Not on file  Transportation Needs: Not on file  Physical Activity: Not on file  Stress: Not on file  Social Connections: Not on file  Intimate Partner Violence: Not on file     ROS- All systems are reviewed and negative except as per the HPI above.  Physical Exam: Vitals:   01/27/21 1417  BP: 134/80  Pulse: 72  Weight: 79.9 kg  Height: 5' (1.524 m)    GEN- The patient is a well appearing obese elderly female, alert and oriented x 3 today.   Head- normocephalic, atraumatic Eyes-  Sclera clear, conjunctiva pink Ears- hearing intact Oropharynx- clear Neck- supple  Lungs- Clear to ausculation bilaterally, normal work of breathing Heart- Regular rate and rhythm, no murmurs, rubs or gallops  GI- soft, NT, ND, + BS Extremities- no clubbing, cyanosis, or edema MS- no significant deformity or atrophy Skin- no rash or lesion Psych- euthymic mood, full affect Neuro- strength and sensation are intact  Wt Readings from Last 3 Encounters:  01/27/21 79.9 kg  01/04/21 77.1 kg  12/16/20 78.9 kg    EKG today demonstrates  SR, RBBB Vent. rate 72 BPM PR interval 192 ms QRS duration 132 ms QT/QTcB 406/444 ms  Epic records are reviewed at length today  CHA2DS2-VASc Score = 8  The patient's score is based upon: CHF History: No HTN History: Yes Diabetes History: Yes Stroke History: Yes Vascular Disease History: Yes Age Score: 2 Gender Score: 1     ASSESSMENT AND PLAN: 1. Atrial flutter The patient's CHA2DS2-VASc score is 8, indicating a 10.8% annual risk of stroke.   General education about atrial flutter provided  and questions answered. We also discussed her stroke risk and the risks and benefits of anticoagulation. Continue Eliquis 5 mg BID and ASA 81 mg daily per Dr Stanford Breed.  Stop Plavix. Check echocardiogram Continue Coreg 6.25 mg BID Will follow up after monitor has resulted to evaluate arrhythmia burden/rate control.   2. Secondary Hypercoagulable State (ICD10:  D68.69) The patient is at significant risk for stroke/thromboembolism based upon her CHA2DS2-VASc Score of 8.  Continue Apixaban (Eliquis).   3. Obesity Body mass index is 34.41 kg/m. Lifestyle modification was discussed at length including regular exercise and weight reduction.  4. HTN Stable, no changes today.  5. PVD S/p left popliteal artery stenting 12/01/20   Follow up in the AF clinic in 3 weeks. Will also refer to establish care with primary cardiologist.    Adline Peals  PA-C [facility] [address] [place], [place] [address] [phone] 01/27/2021 2:28 PM

## 2021-02-01 ENCOUNTER — Other Ambulatory Visit: Payer: Self-pay

## 2021-02-01 ENCOUNTER — Ambulatory Visit (INDEPENDENT_AMBULATORY_CARE_PROVIDER_SITE_OTHER): Payer: Medicare PPO | Admitting: Physician Assistant

## 2021-02-01 VITALS — BP 196/87 | HR 72 | Temp 97.2°F | Resp 16 | Ht 62.0 in | Wt 174.0 lb

## 2021-02-01 DIAGNOSIS — L97421 Non-pressure chronic ulcer of left heel and midfoot limited to breakdown of skin: Secondary | ICD-10-CM

## 2021-02-01 DIAGNOSIS — I70244 Atherosclerosis of native arteries of left leg with ulceration of heel and midfoot: Secondary | ICD-10-CM

## 2021-02-01 NOTE — Progress Notes (Signed)
Office Note     CC:  follow up Requesting Provider:  Hedda Slade, Mamie Nick*  HPI: Dorothy Barker is a 80 y.o. (07-Jul-1941) female who presents for wound check of left foot.  Left above-the-knee popliteal artery stenting was performed by Dr. Stanford Breed on 12/01/2020.  She resides in a nursing facility.  She is accompanied today by her daughter.  She is also seen by Dr. Dellia Nims at the wound clinic.  She believes the left heel ulcer is slowly improving.  She is avoiding all pressure to this area.  She denies any rest pain in her left foot.  She is on Eliquis and aspirin daily.  Past Medical History:  Diagnosis Date   Cancer ([place])    kidney   Chronic kidney disease    Diabetes mellitus without complication ([place])    Hyperlipidemia    Hypertension    Stroke [facility])    Thyroid disease    Vitamin D deficiency     Past Surgical History:  Procedure Laterality Date   ABDOMINAL AORTOGRAM W/LOWER EXTREMITY N/A 12/01/2020   Procedure: ABDOMINAL AORTOGRAM W/LOWER EXTREMITY;  Surgeon: Cherre Robins, MD;  Location: [place] [facility];  Service: Cardiovascular;  Laterality: N/A;   ABDOMINAL HYSTERECTOMY     KNEE SURGERY Right    NEPHRECTOMY Left    PERIPHERAL VASCULAR INTERVENTION Left 12/01/2020   Procedure: PERIPHERAL VASCULAR INTERVENTION;  Surgeon: Cherre Robins, MD;  Location: [place] [facility];  Service: Cardiovascular;  Laterality: Left;   TONSILLECTOMY      Social History   Socioeconomic History   Marital status: Single    Spouse name: Not on file   Number of children: Not on file   Years of education: Not on file   Highest education level: Not on file  Occupational History   Not on file  Tobacco Use   Smoking status: Never    Passive exposure: Yes   Smokeless tobacco: Never  Vaping Use   Vaping Use: Never used  Substance and Sexual Activity   Alcohol use: Never   Drug use: Never   Sexual activity: Not on file  Other Topics Concern   Not on file  Social History Narrative    Resides at [facility]   Right Handed   Drinks 1 cups caffeine daily   Social Determinants of Health   Financial Resource Strain: Not on file  Food Insecurity: Not on file  Transportation Needs: Not on file  Physical Activity: Not on file  Stress: Not on file  Social Connections: Not on file  Intimate Partner Violence: Not on file   History reviewed. No pertinent family history.  Current Outpatient Medications  Medication Sig Dispense Refill   acetaminophen (TYLENOL) 500 MG tablet Take 1,000 mg by mouth every 8 (eight) hours as needed for mild pain or moderate pain.     apixaban (ELIQUIS) 5 MG TABS tablet Take 1 tablet (5 mg total) by mouth 2 (two) times daily. 60 tablet 11   aspirin 81 MG EC tablet Take 1 tablet (81 mg total) by mouth every morning. 30 tablet    atorvastatin (LIPITOR) 40 MG tablet Take 40 mg by mouth at bedtime.     carvedilol (COREG) 6.25 MG tablet Take 6.25 mg by mouth 2 (two) times daily with a meal.     Dulaglutide (TRULICITY) 9.62 XB/2.8UX SOPN Inject 0.75 mg into the skin every Thursday.     ergocalciferol (VITAMIN D2) 1.25 MG (50000 UT) capsule  Take 50,000 Units by mouth every Thursday.     insulin glargine (LANTUS) 100 unit/mL SOPN Inject 16 Units into the skin at bedtime.     lisinopril-hydrochlorothiazide (ZESTORETIC) 20-12.5 MG tablet Take 2 tablets by mouth every morning.     thyroid (ARMOUR) 60 MG tablet Take 60 mg by mouth every morning.     No current facility-administered medications for this visit.    Allergies  Allergen Reactions   Tramadol Other (See Comments)    Syncopal episodes    Amlodipine Besy-Benazepril Hcl Rash   Codeine Rash   Naproxen Rash     REVIEW OF SYSTEMS:   [X]  denotes positive finding, [ ]  denotes negative finding Cardiac  Comments:  Chest pain or chest pressure:    Shortness of breath upon exertion:    Short of breath when lying flat:    Irregular heart rhythm:        Vascular     Pain in calf, thigh, or hip brought on by ambulation:    Pain in feet at night that wakes you up from your sleep:     Blood clot in your veins:    Leg swelling:         Pulmonary    Oxygen at home:    Productive cough:     Wheezing:         Neurologic    Sudden weakness in arms or legs:     Sudden numbness in arms or legs:     Sudden onset of difficulty speaking or slurred speech:    Temporary loss of vision in one eye:     Problems with dizziness:         Gastrointestinal    Blood in stool:     Vomited blood:         Genitourinary    Burning when urinating:     Blood in urine:        Psychiatric    Major depression:         Hematologic    Bleeding problems:    Problems with blood clotting too easily:        Skin    Rashes or ulcers:        Constitutional    Fever or chills:      PHYSICAL EXAMINATION:  Vitals:   02/01/21 1019  BP: (!) 196/87  Pulse: 72  Resp: 16  Temp: (!) 97.2 F (36.2 C)  TempSrc: Temporal  SpO2: 100%  Weight: 174 lb (78.9 kg)  Height: 5\' 2"  (1.575 m)    General:  WDWN in NAD; vital signs documented above Gait: Not observed HENT: WNL, normocephalic Pulmonary: normal non-labored breathing , without Rales, rhonchi,  wheezing Cardiac: regular HR Abdomen: soft, NT, no masses Skin: without rashes Vascular Exam/Pulses:  Right Left  Radial 2+ (normal) 2+ (normal)   Extremities: without ischemic changes, without Gangrene , without cellulitis;  Musculoskeletal: no muscle wasting or atrophy  Neurologic: A&O X 3;  No focal weakness or paresthesias are detected Psychiatric:  The pt has Normal affect.       ASSESSMENT/PLAN:: 80 y.o. female status post left above-the-knee popliteal artery stenting and ongoing wound care for left heel ulceration  -Subjectively the left heel ulceration continues to improve slowly -Brisk DP signal as well as monophasic peroneal and PT signal by Doppler exam of the left lower extremity -Continue wound  care with Dr. Dellia Nims; continue offloading left heel at all times during the day -Recheck arterial  duplex and ABI in 6 months; call/return office sooner with any questions or concerns   Dagoberto Ligas, PA-C [facility] [phone]  Clinic MD:   Stanford Breed

## 2021-02-02 ENCOUNTER — Other Ambulatory Visit: Payer: Self-pay

## 2021-02-02 DIAGNOSIS — I70244 Atherosclerosis of native arteries of left leg with ulceration of heel and midfoot: Secondary | ICD-10-CM

## 2021-02-03 ENCOUNTER — Encounter (HOSPITAL_BASED_OUTPATIENT_CLINIC_OR_DEPARTMENT_OTHER): Payer: Medicare PPO | Admitting: Internal Medicine

## 2021-02-03 ENCOUNTER — Other Ambulatory Visit: Payer: Self-pay

## 2021-02-03 DIAGNOSIS — E10621 Type 1 diabetes mellitus with foot ulcer: Secondary | ICD-10-CM | POA: Diagnosis not present

## 2021-02-04 NOTE — Progress Notes (Signed)
Dorothy Barker, Dorothy Barker ([id number]) Visit Report for 02/03/2021 Debridement Details Patient Name: Date of Service: Dorothy Barker, Dorothy Barker 02/03/2021 2:30 PM Medical Record Number: [id number] Patient Account Number: [id number] Date of Birth/Sex: Treating RN: 03-10-1941 (80 y.o. Dorothy Barker, Dorothy Barker Primary Care Provider: PCP, NO Other Clinician: Referring Provider: Treating Provider/Extender: Linton Ham MO RGA N, RUFFIN Weeks in Treatment: 14 Debridement Performed for Assessment: Wound #1 Left Calcaneus Performed By: Physician Ricard Dillon., MD Debridement Type: Debridement Severity of Tissue Pre Debridement: Fat layer exposed Level of Consciousness (Pre-procedure): Awake and Alert Pre-procedure Verification/Time Out Yes - 14:50 Taken: Start Time: 14:51 Pain Control: Lidocaine 4% T opical Solution T Area Debrided (L x W): otal 1.4 (cm) x 1.8 (cm) = 2.52 (cm) Tissue and other material debrided: Viable, Non-Viable, Eschar, Slough, Subcutaneous, Skin: Dermis , Skin: Epidermis, Fibrin/Exudate, Slough Level: Skin/Subcutaneous Tissue Debridement Description: Excisional Instrument: Curette Bleeding: Minimum Hemostasis Achieved: Pressure End Time: 14:53 Procedural Pain: 0 Post Procedural Pain: 0 Response to Treatment: Procedure was tolerated well Level of Consciousness (Post- Awake and Alert procedure): Post Debridement Measurements of Total Wound Length: (cm) 1.4 Stage: Unstageable/Unclassified Width: (cm) 1.8 Depth: (cm) 0.3 Volume: (cm) 0.594 Character of Wound/Ulcer Post Debridement: Requires Further Debridement Severity of Tissue Post Debridement: Fat layer exposed Post Procedure Diagnosis Same as Pre-procedure Electronic Signature(s) Signed: 02/03/2021 5:31:56 PM By: Deon Pilling Signed: 02/04/2021 10:33:35 AM By: Linton Ham MD Entered By: Linton Ham on 02/03/2021 14:56:01 -------------------------------------------------------------------------------- HPI  Details Patient Name: Date of Service: Dorothy Barker 02/03/2021 2:30 PM Medical Record Number: [id number] Patient Account Number: [id number] Date of Birth/Sex: Treating RN: 03-Mar-1941 (80 y.o. Dorothy Barker Primary Care Provider: PCP, NO Other Clinician: Referring Provider: Treating Provider/Extender: Linton Ham MO RGA N, RUFFIN Weeks in Treatment: 14 History of Present Illness HPI Description: 10/26/2020 upon evaluation today patient appears to be doing somewhat poorly in regard to wounds on her left heel and left lower leg. The leg is really not nearly as bad as the heel ulcer however. This is an unstageable pressure ulcer unfortunately that occurred when the patient was in the hospital secondary to a stroke. At this point the patient is seen with her daughter as well and both are able to answer and respond to questions. With that being said that the biggest issue right now is that the eschar on the heel is not really stable I think that this is going to need to be addressed with more specific and appropriate dressings currently. Apparently she did see a podiatrist more recently and they did perform some sharp debridement to clear away necrotic tissue here on the heel. T be honest the patient's daughter is not extremely pleased about that she states that they heal was completely dry and stable at that point. With that o being said obviously I did not see anything at that time and I cannot really speak to what the heel look like and then but right now it definitely appears to be much softer and I do not think just Betadine is good to do the job. I think we have to focus on trying to get the eschar off of the heel at this point. The patient is in a assisted living facility currently. With that being said the daughter also questions whether or not she is going require skilled nursing. She does have a history of diabetes mellitus type 2. She also has a history of chronic venous  insufficiency. 3/15; patient admitted to the clinic last week. She  has an unstageable pressure ulcer on the left heel that apparently occurred at [facility] when she was recovering from the stroke. She spent some time at [facility] and now is in [facility] assisted living. She has home health twice a week we have been using Iodoflex on this area. It was pointed out last week that she does not have palpable pulses below the femoral we ordered arterial studies but her daughter has not heard anything about this we will recheck this. 3/22; unstageable pressure ulcer on the left heel and an area on her left posterior calf. She had her this showed an ABI in the left of 1.15 however monophasic waveforms in the great toe TBI of 0.18 with a pressure of 39. Doppler analysis revealed monophasic waveforms at the CFA which by my way of thinking might suggest proximal disease she had a 50-74 stenosis of the distal SFA with a focal velocity elevation of 357 cm/s. We have been using Iodoflex to both wound areas. The patient does not really complain of a lot of pain but she is not that ambulatory. 3/29 still unstageable area with eschar on the tip of the left heel. This is gradually separating. We have a vascular consult on April 5 4/21; patient was last seen almost 1 month ago. She was seen by Dr. Roselie Awkward and had a angiogram of the left lower extremity with above-the-knee popliteal artery stenting. Patient states she feels well and has no complaints today. 5/3 the patient had an angiogram I have not reviewed this. Per Dr. Jodene Nam last note she had a stent to the left popliteal artery but I do not know the condition of the tibial vessels. 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 Fortunately the posterior tibial is the dominant flow to the foot. Her popliteal artery was stented. She followed up yesterday her ABI on the left was 1.23 monophasic waveforms but the TBI increased from 0.18-0.54 which is a major improvement. She has an area on the tip of her left heel and a small area on the left medial lower leg 6/2; the patient has been revascularized as noted above. She is using Santyl on the left heel. The area on the left medial lower leg is closed. Still debris which was ischemic eschar on the left heel. I crosshatched this 3 weeks ago this is gradually been falling off. 6/16; 2-week follow-up. Still using Santyl on the left heel. Making really nice progress status post revascularization Electronic Signature(s) Signed: 02/04/2021 10:33:35 AM By: Linton Ham MD Entered By: Linton Ham on 02/03/2021 14:57:05 -------------------------------------------------------------------------------- Physical Exam Details Patient Name: Date of Service: Dorothy Barker 02/03/2021 2:30 PM Medical Record Number: [id number] Patient Account Number: [id number] Date of Birth/Sex: Treating RN: 1941-03-26 (80 y.o. Dorothy Barker Primary Care Provider: PCP, NO Other Clinician: Referring Provider: Treating Provider/Extender: Linton Ham MO RGA N, RUFFIN Weeks in Treatment: 26 Constitutional Patient is hypertensive.. Pulse regular and within target range for patient.Marland Kitchen Respirations regular, non-labored and within target range.. Temperature is normal and within the target range for the patient.Marland Kitchen Appears in no distress. Cardiovascular Very faint pedal pulses but palpable.. Notes Wound exam; left heel  again I removed black eschar and subcutaneous tissue. There is bleeding but still not a completely viable surface. No evidence of surrounding infection. Surface area of the wound is a lot better  however Electronic Signature(s) Signed: 02/04/2021 10:33:35 AM By: Linton Ham MD Entered By: Linton Ham on 02/03/2021 15:02:16 -------------------------------------------------------------------------------- Physician Orders Details Patient Name: Date of Service: Dorothy Barker 02/03/2021 2:30 PM Medical Record Number: [id number] Patient Account Number: [id number] Date of Birth/Sex: Treating RN: [birth year] (79 y.o. 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 - Dr. Dellia Nims Return A Bathing/ Shower/ Hygiene May shower with protection but do not get wound dressing(s) wet. - patient may shower with cast protector with normal showering schedule. patient may wash with soap and water wound, leg, and foot with dressing changes. Edema Control - Lymphedema / SCD / Other Elevate legs to the level of the heart or above for 30 minutes daily and/or when sitting, a frequency of: Avoid standing for long periods of time. Moisturize legs daily. Off-Loading Other: - offloading Barker for left heel when pt. is up and walking. Pt. may wear pressure-relieving boots at bedtime. Patient may walk with PT as long as patient is wearing offloading Barker. Home Health No change in wound care orders this week; continue Home Health for wound care. May utilize formulary equivalent dressing for wound treatment orders unless otherwise  specified. [facility] home health Monday's, Wednesday's, and Friday's. Every other Tuesday at wound center and all other days daughter to change. Wound Treatment Wound #1 - Calcaneus Wound Laterality: Left Cleanser: Soap and Water (Home Health) 1 x Per UGQ/91 Days Discharge Instructions: May shower and wash wound with dial antibacterial soap and water prior to dressing change. Cleanser: Wound Cleanser (Home Health) 1 x Per Day/15 Days Discharge Instructions: Cleanse the wound with wound cleanser prior to applying a clean dressing using gauze sponges, not tissue or cotton balls. Prim Dressing: Santyl Ointment (Home Health) 1 x Per Day/15 Days ary Discharge Instructions: Apply nickel thick amount to wound bed as instructed Secondary Dressing: Woven Gauze Sponge, Non-Sterile 4x4 in (Home Health) 1 x Per Day/15 Days Discharge Instructions: Apply over primary dressing as directed. Secondary Dressing: ABD Pad, 5x9 (Home Health) 1 x Per Day/15 Days Discharge Instructions: Apply over primary dressing as directed. Secured With: [employer], 4.5x3.1 (in/yd) (Home Health) 1 x Per Day/15 Days Discharge Instructions: Secure with Kerlix as directed. Secured With: 61M Medipore H Soft Cloth Surgical T 4 x 2 (in/yd) (Home Health) 1 x Per Day/15 Days ape Discharge Instructions: Secure dressing with tape as directed. Electronic Signature(s) Signed: 02/03/2021 5:31:56 PM By: Deon Pilling Signed: 02/04/2021 10:33:35 AM By: Linton Ham MD Entered By: Deon Pilling on 02/03/2021 14:52:31 -------------------------------------------------------------------------------- Problem List Details Patient Name: Date of Service: Dorothy Barker 02/03/2021 2:30 PM Medical Record Number: [id number] Patient Account Number: [id number] Date of Birth/Sex: Treating RN: September 29, 1940 (79 y.o. 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 Dorothy Barker Primary Care Provider: PCP, NO Other Clinician: Referring Provider: Treating Provider/Extender: Linton Ham MO RGA N, RUFFIN Weeks in Treatment: 14 Subjective History of Present Illness (HPI) 10/26/2020 upon evaluation today patient appears to be doing somewhat poorly in regard to wounds on her left heel and left lower leg. The leg is really not nearly as bad as the heel ulcer however. This is an unstageable pressure ulcer unfortunately that occurred when the patient was in the hospital secondary to a stroke. At this point the patient is seen with her daughter as well and both are able to answer and respond to questions. With that being said that the biggest issue right now is that the eschar on the heel is not really stable I think that this is going to need to be addressed with more specific and appropriate dressings currently. Apparently she did see a podiatrist more recently and they did perform some sharp  debridement to clear away necrotic tissue here on the heel. T be honest o the patient's daughter is not extremely pleased about that she states that they heal was completely dry and stable at that point. With that being said obviously I did not see anything at that time and I cannot really speak to what the heel look like and then but right now it definitely appears to be much softer and I do not think just Betadine is good to do the job. I think we have to focus on trying to get the eschar off of the heel at this point. The patient is in a assisted living facility currently. With that being said the daughter also questions whether or not she is going require skilled nursing. She does have a history of diabetes mellitus type 2. She also has a history of chronic venous insufficiency. 3/15; patient admitted to the clinic last week. She has an unstageable pressure ulcer on the left heel that apparently occurred at [facility] when she was recovering from the stroke. She spent some time at the rehab unit of [facility] and now is in [facility] assisted living. She has home health twice a week we have been using Iodoflex on this area. It was pointed out last week that she does not have palpable pulses below the femoral we ordered arterial studies but her daughter has not heard anything about this we will recheck this. 3/22; unstageable pressure ulcer on the left heel and an area on her left posterior calf. She had her this showed an ABI in the left of 1.15 however monophasic waveforms in the great toe TBI of 0.18 with a pressure of 39. Doppler analysis revealed monophasic waveforms at the CFA which by my way of thinking might suggest proximal disease  she had a 50-74 stenosis of the distal SFA with a focal velocity elevation of 357 cm/s. We have been using Iodoflex to both wound areas. The patient does not really complain of a lot of pain but she is not that ambulatory. 3/29 still unstageable area with  eschar on the tip of the left heel. This is gradually separating. We have a vascular consult on April 5 4/21; patient was last seen almost 1 month ago. She was seen by Dr. Roselie Awkward and had a angiogram of the left lower extremity with above-the-knee popliteal artery stenting. Patient states she feels well and has no complaints today. 5/3 the patient had an angiogram I have not reviewed this. Per Dr. Jodene Nam last note she had a stent to the left popliteal artery but I do not know the condition of the tibial vessels. 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 Fortunately the posterior tibial is the dominant flow to the foot. Her popliteal artery was stented. She followed up yesterday her ABI on the left was 1.23 monophasic waveforms but the TBI increased from 0.18-0.54 which is a major improvement. She has an area on the tip of her left heel and a small area on the left medial lower leg 6/2; the patient has been revascularized as noted above. She is using Santyl on the left heel. The area on the left medial lower leg is closed. Still debris which was ischemic eschar on the left heel. I crosshatched this 3 weeks ago this is gradually been falling off. 6/16; 2-week follow-up. Still using Santyl on the left heel. Making really nice progress status post revascularization Objective Constitutional Patient is hypertensive.. Pulse regular and within target range for patient.Marland Kitchen Respirations regular, non-labored and within target range.. Temperature is normal and within the target range for the patient.Marland Kitchen Appears in no distress. Vitals Time Taken: 2:33 PM, Height: 62 in, Weight: 173 lbs, BMI: 31.6, Temperature: 97.4 F,  Pulse: 73 bpm, Respiratory Rate: 18 breaths/min, Blood Pressure: 177/86 mmHg. Cardiovascular Very faint pedal pulses but palpable.. General Notes: Wound exam; left heel again I removed black eschar and subcutaneous tissue. There is bleeding but still not a completely viable surface. No evidence of surrounding infection. Surface area of the wound is a lot better however Integumentary (Hair, Skin) Wound #1 status is Open. Original cause of wound was Pressure Injury. The date acquired was: 09/21/2020. The wound has been in treatment 14 weeks. The wound is located on the Left Calcaneus. The wound measures 1.4cm length x 1.8cm width x 0.3cm depth; 1.979cm^2 area and 0.594cm^3 volume. There is Fat Layer (Subcutaneous Tissue) exposed. There is no tunneling or undermining noted. There is a medium amount of serosanguineous drainage noted. The wound margin is distinct with the outline attached to the wound base. There is small (1-33%) red granulation within the wound bed. There is a large (67-100%) amount of necrotic tissue within the wound bed including Eschar. 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 There was a Excisional Skin/Subcutaneous Tissue Debridement with a total area of 2.52 sq cm performed by Ricard Dillon., MD. With the following instrument(s): Curette to remove Viable and Non-Viable tissue/material. Material removed includes Eschar, Subcutaneous Tissue, Slough, Skin: Dermis, Skin: Epidermis, and Fibrin/Exudate after achieving pain control using  Lidocaine 4% Topical Solution. A time out was conducted at 14:50, prior to the start of the procedure. A Minimum amount of bleeding was controlled with Pressure. The procedure was tolerated well with a pain level of 0 throughout and a pain level of 0 following the procedure. Post Debridement Measurements: 1.4cm length x 1.8cm width x 0.3cm depth; 0.594cm^3 volume. Post debridement Stage noted as Unstageable/Unclassified. Character of Wound/Ulcer Post Debridement requires further debridement. Severity of Tissue Post Debridement is: Fat layer exposed. Post procedure Diagnosis Wound #1: Same as Pre-Procedure Plan Follow-up Appointments: Return Appointment in 2 weeks. - Dr. Dellia Nims Bathing/ Shower/ Hygiene: May shower with protection but do not get wound dressing(s) wet. - patient may shower with cast protector with normal showering schedule. patient may wash with soap and water wound, leg, and foot with dressing changes. Edema Control - Lymphedema / SCD / Other: Elevate legs to the level of the heart or above for 30 minutes daily and/or when sitting, a frequency of: Avoid standing for long periods of time. Moisturize legs daily. Off-Loading: Other: - offloading Barker for left heel when pt. is up and walking. Pt. may wear pressure-relieving boots at bedtime. Patient may walk with PT as long as patient is wearing offloading Barker. Home Health: No change in wound care orders this week; continue Home Health for wound care. May utilize formulary equivalent dressing for wound treatment orders unless otherwise specified. [facility] home health Monday's, Wednesday's, and Friday's. Every other Tuesday at wound center and all other days daughter to change. WOUND #1: - Calcaneus Wound Laterality: Left Cleanser: Soap and Water (Home Health) 1 x Per BWG/66 Days Discharge Instructions: May shower and wash wound with dial antibacterial soap and water prior to dressing change. Cleanser: Wound Cleanser (Home Health)  1 x Per Day/15 Days Discharge Instructions: Cleanse the wound with wound cleanser prior to applying a clean dressing using gauze sponges, not tissue or cotton balls. Prim Dressing: Santyl Ointment (Home Health) 1 x Per Day/15 Days ary Discharge Instructions: Apply nickel thick amount to wound bed as instructed Secondary Dressing: Woven Gauze Sponge, Non-Sterile 4x4 in (Home Health) 1 x Per Day/15 Days Discharge Instructions: Apply over primary dressing as directed. Secondary Dressing: ABD Pad, 5x9 (Home Health) 1 x Per Day/15 Days Discharge Instructions: Apply over primary dressing as directed. Secured With: [employer], 4.5x3.1 (in/yd) (Home Health) 1 x Per Day/15 Days Discharge Instructions: Secure with Kerlix as directed. Secured With: 60M Medipore H Soft Cloth Surgical T 4 x 2 (in/yd) (Home Health) 1 x Per Day/15 Days ape Discharge Instructions: Secure dressing with tape as directed. 1. I am continuing with the Santyl. She seems to do very well with this 2. Continued mechanical debridement necessary to get the surface to finally epithelialized this. The revascularization certainly has helped 3. Follow-up 2 weeks Electronic Signature(s) Signed: 02/04/2021 10:33:35 AM By: Linton Ham MD Entered By: Linton Ham on 02/03/2021 15:02:54 -------------------------------------------------------------------------------- SuperBill Details Patient Name: Date of Service: Dorothy Barker 02/03/2021 Medical Record Number: [id number] Patient Account Number:  [id number] Date of Birth/Sex: Treating RN: Oct 19, 1940 (80 y.o. 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

## 2021-02-06 ENCOUNTER — Encounter (HOSPITAL_COMMUNITY): Payer: Self-pay | Admitting: Internal Medicine

## 2021-02-06 ENCOUNTER — Observation Stay (HOSPITAL_COMMUNITY)
Admission: EM | Admit: 2021-02-06 | Discharge: 2021-02-08 | Disposition: A | Discharge status: Home / Self Care | Payer: Medicare PPO | Attending: Internal Medicine | Admitting: Internal Medicine

## 2021-02-06 ENCOUNTER — Emergency Department (HOSPITAL_COMMUNITY): Payer: Medicare PPO

## 2021-02-06 DIAGNOSIS — I129 Hypertensive chronic kidney disease with stage 1 through stage 4 chronic kidney disease, or unspecified chronic kidney disease: Secondary | ICD-10-CM | POA: Diagnosis not present

## 2021-02-06 DIAGNOSIS — I639 Cerebral infarction, unspecified: Secondary | ICD-10-CM | POA: Diagnosis present

## 2021-02-06 DIAGNOSIS — Y900 Blood alcohol level of less than 20 mg/100 ml: Secondary | ICD-10-CM | POA: Insufficient documentation

## 2021-02-06 DIAGNOSIS — S0990XA Unspecified injury of head, initial encounter: Secondary | ICD-10-CM | POA: Diagnosis present

## 2021-02-06 DIAGNOSIS — L8990 Pressure ulcer of unspecified site, unspecified stage: Secondary | ICD-10-CM | POA: Insufficient documentation

## 2021-02-06 DIAGNOSIS — I10 Essential (primary) hypertension: Secondary | ICD-10-CM

## 2021-02-06 DIAGNOSIS — Z79899 Other long term (current) drug therapy: Secondary | ICD-10-CM | POA: Diagnosis not present

## 2021-02-06 DIAGNOSIS — R918 Other nonspecific abnormal finding of lung field: Secondary | ICD-10-CM | POA: Diagnosis not present

## 2021-02-06 DIAGNOSIS — E1122 Type 2 diabetes mellitus with diabetic chronic kidney disease: Secondary | ICD-10-CM | POA: Diagnosis not present

## 2021-02-06 DIAGNOSIS — R202 Paresthesia of skin: Secondary | ICD-10-CM | POA: Insufficient documentation

## 2021-02-06 DIAGNOSIS — S0083XA Contusion of other part of head, initial encounter: Principal | ICD-10-CM | POA: Insufficient documentation

## 2021-02-06 DIAGNOSIS — Z7982 Long term (current) use of aspirin: Secondary | ICD-10-CM | POA: Diagnosis not present

## 2021-02-06 DIAGNOSIS — Y92009 Unspecified place in unspecified non-institutional (private) residence as the place of occurrence of the external cause: Secondary | ICD-10-CM

## 2021-02-06 DIAGNOSIS — N1832 Chronic kidney disease, stage 3b: Secondary | ICD-10-CM

## 2021-02-06 DIAGNOSIS — Z794 Long term (current) use of insulin: Secondary | ICD-10-CM

## 2021-02-06 DIAGNOSIS — Y92002 Bathroom of unspecified non-institutional (private) residence single-family (private) house as the place of occurrence of the external cause: Secondary | ICD-10-CM | POA: Diagnosis not present

## 2021-02-06 DIAGNOSIS — I63542 Cerebral infarction due to unspecified occlusion or stenosis of left cerebellar artery: Secondary | ICD-10-CM | POA: Insufficient documentation

## 2021-02-06 DIAGNOSIS — R911 Solitary pulmonary nodule: Secondary | ICD-10-CM

## 2021-02-06 DIAGNOSIS — W19XXXA Unspecified fall, initial encounter: Secondary | ICD-10-CM

## 2021-02-06 DIAGNOSIS — Z7901 Long term (current) use of anticoagulants: Secondary | ICD-10-CM | POA: Diagnosis not present

## 2021-02-06 DIAGNOSIS — I4892 Unspecified atrial flutter: Secondary | ICD-10-CM

## 2021-02-06 DIAGNOSIS — U071 COVID-19: Secondary | ICD-10-CM | POA: Diagnosis not present

## 2021-02-06 DIAGNOSIS — E876 Hypokalemia: Secondary | ICD-10-CM | POA: Diagnosis present

## 2021-02-06 DIAGNOSIS — E039 Hypothyroidism, unspecified: Secondary | ICD-10-CM | POA: Diagnosis not present

## 2021-02-06 DIAGNOSIS — I482 Chronic atrial fibrillation, unspecified: Secondary | ICD-10-CM | POA: Insufficient documentation

## 2021-02-06 DIAGNOSIS — W1830XA Fall on same level, unspecified, initial encounter: Secondary | ICD-10-CM | POA: Diagnosis not present

## 2021-02-06 HISTORY — DX: Chronic kidney disease, stage 3b: N18.32

## 2021-02-06 HISTORY — DX: Essential (primary) hypertension: I10

## 2021-02-06 HISTORY — DX: Long term (current) use of insulin: Z79.4

## 2021-02-06 HISTORY — DX: Unspecified atrial flutter: I48.92

## 2021-02-06 HISTORY — DX: Hypothyroidism, unspecified: E03.9

## 2021-02-06 LAB — RESP PANEL BY RT-PCR (FLU A&B, COVID) ARPGX2
Influenza A by PCR: NEGATIVE
Influenza B by PCR: NEGATIVE
SARS Coronavirus 2 by RT PCR: POSITIVE — AB

## 2021-02-06 LAB — I-STAT CHEM 8, ED
BUN: 32 mg/dL — ABNORMAL HIGH (ref 8–23)
Calcium, Ion: 1.27 mmol/L (ref 1.15–1.40)
Chloride: 98 mmol/L (ref 98–111)
Creatinine, Ser: 1.3 mg/dL — ABNORMAL HIGH (ref 0.44–1.00)
Glucose, Bld: 277 mg/dL — ABNORMAL HIGH (ref 70–99)
HCT: 36 % (ref 36.0–46.0)
Hemoglobin: 12.2 g/dL (ref 12.0–15.0)
Potassium: 3.6 mmol/L (ref 3.5–5.1)
Sodium: 135 mmol/L (ref 135–145)
TCO2: 26 mmol/L (ref 22–32)

## 2021-02-06 LAB — COMPREHENSIVE METABOLIC PANEL
ALT: 20 U/L (ref 0–44)
AST: 21 U/L (ref 15–41)
Albumin: 3.1 g/dL — ABNORMAL LOW (ref 3.5–5.0)
Alkaline Phosphatase: 71 U/L (ref 38–126)
Anion gap: 11 (ref 5–15)
BUN: 31 mg/dL — ABNORMAL HIGH (ref 8–23)
CO2: 26 mmol/L (ref 22–32)
Calcium: 9.9 mg/dL (ref 8.9–10.3)
Chloride: 96 mmol/L — ABNORMAL LOW (ref 98–111)
Creatinine, Ser: 1.43 mg/dL — ABNORMAL HIGH (ref 0.44–1.00)
GFR, Estimated: 37 mL/min — ABNORMAL LOW (ref >60)
Glucose, Bld: 275 mg/dL — ABNORMAL HIGH (ref 70–99)
Potassium: 3.4 mmol/L — ABNORMAL LOW (ref 3.5–5.1)
Sodium: 133 mmol/L — ABNORMAL LOW (ref 135–145)
Total Bilirubin: 0.6 mg/dL (ref 0.3–1.2)
Total Protein: 6.5 g/dL (ref 6.5–8.1)

## 2021-02-06 LAB — SAMPLE TO BLOOD BANK

## 2021-02-06 LAB — PROTIME-INR
INR: 1.3 — ABNORMAL HIGH (ref 0.8–1.2)
Prothrombin Time: 16.4 seconds — ABNORMAL HIGH (ref 11.4–15.2)

## 2021-02-06 LAB — CBC
HCT: 36.9 % (ref 36.0–46.0)
Hemoglobin: 11.9 g/dL — ABNORMAL LOW (ref 12.0–15.0)
MCH: 29.1 pg (ref 26.0–34.0)
MCHC: 32.2 g/dL (ref 30.0–36.0)
MCV: 90.2 fL (ref 80.0–100.0)
Platelets: 208 10*3/uL (ref 150–400)
RBC: 4.09 MIL/uL (ref 3.87–5.11)
RDW: 12.2 % (ref 11.5–15.5)
WBC: 9.6 10*3/uL (ref 4.0–10.5)
nRBC: 0 % (ref 0.0–0.2)

## 2021-02-06 LAB — ETHANOL: Alcohol, Ethyl (B): <10 mg/dL (ref <10)

## 2021-02-06 LAB — LACTIC ACID, PLASMA: Lactic Acid, Venous: 1.9 mmol/L (ref 0.5–1.9)

## 2021-02-06 MED ORDER — THYROID 60 MG PO TABS
60.0000 mg | ORAL_TABLET | Freq: Every day | ORAL | Status: DC
  Administered 2021-02-07 – 2021-02-08 (×2): 60 mg via ORAL
  Filled 2021-02-06 (×2): qty 1

## 2021-02-06 MED ORDER — ONDANSETRON HCL 4 MG/2ML IJ SOLN: 4.0000 mg | Freq: Four times a day (QID) | INTRAMUSCULAR | Status: DC | PRN

## 2021-02-06 MED ORDER — ATORVASTATIN CALCIUM 80 MG PO TABS
80.0000 mg | ORAL_TABLET | Freq: Every day | ORAL | Status: DC
  Administered 2021-02-07 (×2): 80 mg via ORAL
  Filled 2021-02-06: qty 2
  Filled 2021-02-06: qty 1

## 2021-02-06 MED ORDER — STROKE: EARLY STAGES OF RECOVERY BOOK: Freq: Once | Status: DC

## 2021-02-06 MED ORDER — ASPIRIN EC 81 MG PO TBEC
81.0000 mg | DELAYED_RELEASE_TABLET | Freq: Every day | ORAL | Status: DC
  Administered 2021-02-07 (×2): 81 mg via ORAL
  Filled 2021-02-06 (×2): qty 1

## 2021-02-06 MED ORDER — FENTANYL CITRATE (PF) 100 MCG/2ML IJ SOLN
INTRAMUSCULAR | Status: AC
  Filled 2021-02-06: qty 2

## 2021-02-06 MED ORDER — INSULIN GLARGINE 100 UNIT/ML ~~LOC~~ SOLN
16.0000 [IU] | Freq: Every day | SUBCUTANEOUS | Status: DC
  Filled 2021-02-06: qty 0.16

## 2021-02-06 MED ORDER — ACETAMINOPHEN 160 MG/5ML PO SOLN: 650.0000 mg | ORAL | Status: DC | PRN

## 2021-02-06 MED ORDER — INSULIN ASPART 100 UNIT/ML IJ SOLN
0.0000 [IU] | Freq: Three times a day (TID) | INTRAMUSCULAR | Status: DC
Start: 2021-02-07 — End: 2021-02-08
  Administered 2021-02-07 (×4): 3 [IU] via SUBCUTANEOUS
  Administered 2021-02-08: 1 [IU] via SUBCUTANEOUS

## 2021-02-06 MED ORDER — POLYETHYLENE GLYCOL 3350 17 G PO PACK: 17.0000 g | PACK | Freq: Every day | ORAL | Status: DC | PRN

## 2021-02-06 MED ORDER — ACETAMINOPHEN 650 MG RE SUPP: 650.0000 mg | RECTAL | Status: DC | PRN

## 2021-02-06 MED ORDER — ACETAMINOPHEN 325 MG PO TABS: 650.0000 mg | ORAL_TABLET | ORAL | Status: DC | PRN

## 2021-02-06 NOTE — H&P (Signed)
History and Physical    MIGUEL MEDAL YFV:[id number] DOB: March 06, 1941 DOA: 02/06/2021  PCP: Pcp, No  Patient coming from: [place]   Chief Complaint:  Chief Complaint  Patient presents with   Fall     HPI:    80 year old female with past medical history of hyperlipidemia, hypertension, insulin-dependent diabetes mellitus type 2, hypothyroidism, atrial flutter (on Eliquis), recent cardioembolic stroke diagnosed 08/2020 secondary to atrial flutter and peripheral vascular disease who presents to [facility] emergency department via EMS from her assisted living facility status post fall.  Patient explains that for approximately the past 2 to 3 weeks the patient has noticed that her right upper extremity and hand have seemed somewhat numb.  She is also noted that over the same span of time she cannot seem to move her hand like she wants to do and is having difficulty writing.  Patient denies any other focal neurologic symptoms such as unilateral weakness, slurred speech or changes in vision over the span of time.  Patient explains that on 6/19 she attempted to get up from her wheelchair to walk to the bathroom as she typically does but somehow "my leg gave out on me."  This resulted in the patient falling.  Due to the patient's fall and concerns for injury with the patient being on chronic Eliquis therapy, EMS was contacted and the patient was promptly evaluated and brought into [facility] emergency department for a trauma evaluation.  Upon evaluation in the emergency department patient was incidentally found to be COVID-negative via PCR testing.  Trauma survey revealed no injury to the pelvis or cervical spine.  However, on noncontrast CT imaging of the brain there was an area of edema identified in the left cerebellum without mass-effect or hemorrhage concerning for subacute infarction.  ER provider discussed case with Dr.Arora recommended holding the patient's home  regimen of Eliquis, using aspirin 81 mg daily as the sole antiplatelet therapy and hospitalized and the patient on the hospital service for further evaluation including MRI brain.  The hospitalist group was then called to assist the patient for admission to the hospital.  Review of Systems:   Review of Systems  Musculoskeletal:  Positive for falls.  Neurological:  Positive for focal weakness.  All other systems reviewed and are negative.  Past Medical History:  Diagnosis Date   Chronic kidney disease, stage 3b ([place]) 02/06/2021   Essential hypertension 02/06/2021   Hypothyroidism 02/06/2021   Paroxysmal atrial flutter ([place]) 02/06/2021   Type 2 diabetes mellitus with stage 3b chronic kidney disease, with long-term current use of insulin ([place]) 02/06/2021    History reviewed. No pertinent surgical history.   reports that she has never smoked. She has never used smokeless tobacco. No history on file for alcohol use and drug use.  Allergies  Allergen Reactions   Amlodipine Other (See Comments)    Unknown reaction   Codeine Other (See Comments)    Unknown reaction   Lotensin [Benazepril] Other (See Comments)    Unknown reaction   Lotrel [Amlodipine Besy-Benazepril Hcl] Other (See Comments)    Unknown reaction   Naproxen Other (See Comments)    Unknown reaction   Tramadol Other (See Comments)    Unknown reaction    Family History  Problem Relation Age of Onset   Heart disease Neg Hx      Prior to Admission medications   Not on File    Physical Exam: Vitals:   02/06/21 1913 02/06/21 1925  02/06/21 2030 02/07/21 0000  BP: (!) 160/90 (!) 212/98 (!) 155/95 (!) 146/68  Pulse:  89 (!) 101 75  Resp:  18 19 10   Temp:  98.8 F (37.1 C)    TempSrc:  Oral    SpO2:  99% 99% 97%    Constitutional: Awake alert and oriented x3, no associated distress.   Skin: Left ankle dressing is clean dry and intact.  No rashes, no lesions, poor skin turgor noted. Eyes: Pupils are equally reactive  to light.  No evidence of scleral icterus or conjunctival pallor.  ENMT: Dry mucous membranes noted.  Posterior pharynx clear of any exudate or lesions.   Neck: normal, supple, no masses, no thyromegaly.  No evidence of jugular venous distension.   Respiratory: clear to auscultation bilaterally, no wheezing, no crackles. Normal respiratory effort. No accessory muscle use.  Cardiovascular: Regular rate and rhythm, no murmurs / rubs / gallops. No extremity edema. 2+ pedal pulses. No carotid bruits.  Chest:   Nontender without crepitus or deformity.   Back:   Nontender without crepitus or deformity. Abdomen: Abdomen is soft and nontender.  No evidence of intra-abdominal masses.  Positive bowel sounds noted in all quadrants.   Musculoskeletal: No joint deformity upper and lower extremities. Good ROM, no contractures. Normal muscle tone.  Neurologic: CN 2-12 grossly intact.  Notable decrease sensation of the right hand.  Patient moving all 4 extremities spontaneously.  Patient is following all commands.  Patient is responsive to verbal stimuli.   Psychiatric: Patient exhibits normal mood with appropriate affect.  Patient seems to possess insight as to their current situation.     Labs on Admission: I have personally reviewed following labs and imaging studies -   CBC: Recent Labs  Lab 02/06/21 1919 02/06/21 1941  WBC 9.6  --   HGB 11.9* 12.2  HCT 36.9 36.0  MCV 90.2  --   PLT 208  --    Basic Metabolic Panel: Recent Labs  Lab 02/06/21 1919 02/06/21 1941  NA 133* 135  K 3.4* 3.6  CL 96* 98  CO2 26  --   GLUCOSE 275* 277*  BUN 31* 32*  CREATININE 1.43* 1.30*  CALCIUM 9.9  --    GFR: CrCl cannot be calculated (Unknown ideal weight.). Liver Function Tests: Recent Labs  Lab 02/06/21 1919  AST 21  ALT 20  ALKPHOS 71  BILITOT 0.6  PROT 6.5  ALBUMIN 3.1*   No results for input(s): LIPASE, AMYLASE in the last 168 hours. No results for input(s): AMMONIA in the last 168  hours. Coagulation Profile: Recent Labs  Lab 02/06/21 1919  INR 1.3*   Cardiac Enzymes: No results for input(s): CKTOTAL, CKMB, CKMBINDEX, TROPONINI in the last 168 hours. BNP (last 3 results) No results for input(s): PROBNP in the last 8760 hours. HbA1C: No results for input(s): HGBA1C in the last 72 hours. CBG: No results for input(s): GLUCAP in the last 168 hours. Lipid Profile: No results for input(s): CHOL, HDL, LDLCALC, TRIG, CHOLHDL, LDLDIRECT in the last 72 hours. Thyroid Function Tests: No results for input(s): TSH, T4TOTAL, FREET4, T3FREE, THYROIDAB in the last 72 hours. Anemia Panel: No results for input(s): VITAMINB12, FOLATE, FERRITIN, TIBC, IRON, RETICCTPCT in the last 72 hours. Urine analysis: No results found for: COLORURINE, APPEARANCEUR, McKean, Holmes, GLUCOSEU, HGBUR, BILIRUBINUR, KETONESUR, PROTEINUR, UROBILINOGEN, NITRITE, LEUKOCYTESUR  Radiological Exams on Admission - Personally Reviewed: CT Head Wo Contrast  Result Date: 02/06/2021 CLINICAL DATA:  Fall EXAM: CT HEAD WITHOUT CONTRAST  CT CERVICAL SPINE WITHOUT CONTRAST TECHNIQUE: Multidetector CT imaging of the head and cervical spine was performed following the standard protocol without intravenous contrast. Multiplanar CT image reconstructions of the cervical spine were also generated. COMPARISON:  None. FINDINGS: CT HEAD FINDINGS Brain: Area of edema in the left cerebellum measures approximately 4.0 x 3.5 cm. There is an old infarct of the right basal ganglia. There is periventricular hypoattenuation compatible with chronic microvascular disease. There is generalized atrophy without lobar predilection. No acute hemorrhage Vascular: No abnormal hyperdensity of the major intracranial arteries or dural venous sinuses. No intracranial atherosclerosis. Skull: The visualized skull base, calvarium and extracranial soft tissues are normal. Sinuses/Orbits: No fluid levels or advanced mucosal thickening of the visualized  paranasal sinuses. No mastoid or middle ear effusion. The orbits are normal. CT CERVICAL SPINE FINDINGS Alignment: No static subluxation. Facets are aligned. Occipital condyles are normally positioned. Skull base and vertebrae: No acute fracture. Soft tissues and spinal canal: No prevertebral fluid or swelling. No visible canal hematoma. Disc levels: Large central disc protrusion at C4-5 indents the ventral spinal cord. Small central disc protrusions also at C2-3 and C3-4 narrows the ventral thecal sac. Upper chest: No pneumothorax, pulmonary nodule or pleural effusion. Other: Normal visualized paraspinal cervical soft tissues. IMPRESSION: 1. Area of edema in the left cerebellum without associated mass effect or acute hemorrhage, most likely a subacute infarct. MRI of the brain with and without contrast is recommended for further characterization and to exclude neoplasm. 2. No acute fracture or static subluxation of the cervical spine. 3. 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 Electronically Signed   By: Ulyses Jarred M.D.   On: 02/06/2021 20:01   CT Cervical Spine Wo Contrast  Result Date: 02/06/2021 CLINICAL DATA:  Fall EXAM: CT HEAD WITHOUT CONTRAST CT CERVICAL SPINE WITHOUT CONTRAST TECHNIQUE: Multidetector CT imaging of the head and cervical spine was performed following the standard protocol without intravenous contrast. Multiplanar CT image reconstructions of the cervical spine were also generated. COMPARISON:  None. FINDINGS: CT HEAD FINDINGS Brain: Area of edema in the left cerebellum measures approximately 4.0 x 3.5 cm. There is an old infarct of the right basal ganglia. There is periventricular hypoattenuation compatible with chronic microvascular disease. There is generalized atrophy without lobar predilection. No acute hemorrhage Vascular: No abnormal hyperdensity of the major intracranial arteries or dural venous sinuses. No intracranial atherosclerosis. Skull: The  visualized skull base, calvarium and extracranial soft tissues are normal. Sinuses/Orbits: No fluid levels or advanced mucosal thickening of the visualized paranasal sinuses. No mastoid or middle ear effusion. The orbits are normal. CT CERVICAL SPINE FINDINGS Alignment: No static subluxation. Facets are aligned. Occipital condyles are normally positioned. Skull base and vertebrae: No acute fracture. Soft tissues and spinal canal: No prevertebral fluid or swelling. No visible canal hematoma. Disc levels: Large central disc protrusion at C4-5 indents the ventral spinal cord. Small central disc protrusions also at C2-3 and C3-4 narrows the ventral thecal sac. Upper chest: No pneumothorax, pulmonary nodule or pleural effusion. Other: Normal visualized paraspinal cervical soft tissues. IMPRESSION: 1. Area of edema in the left cerebellum without associated mass effect or acute hemorrhage, most likely a subacute infarct. MRI of the brain with and without contrast is recommended for further characterization and to exclude neoplasm. 2. No acute fracture or static subluxation of the cervical spine. 3. 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 Electronically Signed   By: Cletus Gash.D.  On: 02/06/2021 20:01   MR BRAIN WO CONTRAST  Result Date: 02/07/2021 CLINICAL DATA:  Stroke follow-up EXAM: MRI HEAD WITHOUT CONTRAST TECHNIQUE: Multiplanar, multiecho pulse sequences of the brain and surrounding structures were obtained without intravenous contrast. COMPARISON:  01/04/2021 FINDINGS: Brain: Large acute infarct of the left cerebellum. Old right basal ganglia infarct. No acute or chronic hemorrhage. There is multifocal hyperintense T2-weighted signal within the white matter. Generalized volume loss without a clear lobar predilection. The midline structures are normal. Vascular: Major flow voids are preserved. Skull and upper cervical spine: Normal calvarium and skull base. Visualized upper  cervical spine and soft tissues are normal. Sinuses/Orbits:Left maxillary retention cyst no mastoid or middle ear effusion. Normal orbits. IMPRESSION: 1. Large acute left cerebellar infarct. No hemorrhage or mass effect. 2. Old right basal ganglia infarct and findings of chronic microvascular ischemia. Electronically Signed   By: Ulyses Jarred M.D.   On: 02/07/2021 02:18   DG Pelvis Portable  Result Date: 02/06/2021 CLINICAL DATA:  Ground level fall EXAM: PORTABLE PELVIS 1-2 VIEWS COMPARISON:  None. FINDINGS: There is no evidence of pelvic fracture or diastasis. Degenerative changes of the bilateral hips, right worse than left. No pelvic bone lesions are seen. Advanced vascular calcifications are present. IMPRESSION: Negative. Electronically Signed   By: Davina Poke D.O.   On: 02/06/2021 20:29   DG Chest Port 1 View  Result Date: 02/06/2021 CLINICAL DATA:  Ground level fall EXAM: PORTABLE CHEST 1 VIEW COMPARISON:  None. FINDINGS: Heart size within normal limits. Low lung volumes. Blunting of the left costophrenic angle may reflect small pleural effusion versus prominent epicardial fat pad. 11 mm nodular density projects within the left upper lobe. Right lung is clear. No pneumothorax. No acute osseous abnormality. IMPRESSION: 1. Blunting of the left costophrenic angle may reflect small pleural effusion versus prominent epicardial fat pad. 2. 11 mm nodular density projects within the left upper lobe. Follow-up chest CT is recommended for further evaluation if recent outside imaging of the chest is unavailable. Electronically Signed   By: Davina Poke D.O.   On: 02/06/2021 20:26    EKG: Personally reviewed.  Rhythm is sinus tachycardia with heart rate of 109 bpm.  No dynamic ST segment changes appreciated.  Assessment/Plan Principal Problem:   Acute ischemic stroke [facility])  Patient presenting status post fall after stating that her legs gave out on her continuously.  Patient additionally is  complaining of right hand weakness and numbness over the past 2 to 3 weeks.   CT imaging of the brain revealing an area of concern in the left cerebellum concerning for subacute infarct.   Hospitalizing patient for stroke work-up  MRI brain with and without contrast ordered  Serial neurologic checks  Permissive hypertension  PT, OT, SLP evaluation in the morning  Neurology has been contacted by the emergency department staff who will come and formally consult on the patient.   Holding home regimen of Eliquis for now to reduce risk of hemorrhagic conversion and simply giving aspirin 81 mg daily per neurology recommendations.   Cardiogram in the morning  Monitoring patient on telemetry   Active Problems:   Fall at home, initial encounter  Fall presumably secondary to new neurologic deficit from recurrent stroke PT, OT evaluation tomorrow Fall precautions No traumatic injury identified    COVID-19 virus infection  COVID PCR positive, incidental finding Patient denies shortness of breath, fever or cough Patient exhibiting no evidence of hypoxia here Considering patient's numerous comorbidities will treat patient  with 3-day course of intravenous remdesivir for incidental COVID No steroids needed Airborne and contact precautions As needed bronchodilator therapy for shortness of breath and wheezing As needed antitussives Zinc and vitamin C segmentation    Essential hypertension  Holding home regimen of lisinopril and hydrochlorothiazide due to permissive hypertension  Continuing home regimen of low-dose Coreg however due to history of atrial flutter.     Hypothyroidism  Resume home regimen of Synthroid    Paroxysmal atrial flutter (HCC)  Paroxysmal atrial flutter identified as the cause of the patient's cardioembolic strokes in January 2022 and was identified via post admission cardiac monitoring Patient has been taking Eliquis ever since but unfortunately still developed a  recurrent stroke Currently holding home regimen of Eliquis to avoid hemorrhagic conversion Continuing low-dose Coreg however for rate control Monitoring patient on telemetry    Type 2 diabetes mellitus with stage 3b chronic kidney disease, with long-term current use of insulin ([place])  Patient been placed on Accu-Cheks before every meal and nightly with sliding scale insulin Continue home regimen of Lantus Hemoglobin A1C ordered Diabetic Diet    Hypokalemia  Replacing with oral potassium chloride Monitoring potassium levels with serial chemistries    Chronic kidney disease, stage 3b (HCC)  Strict intake and output monitoring Creatinine near baseline Minimizing nephrotoxic agents as much as possible Serial chemistries to monitor renal function and electrolytes    Left upper lobe pulmonary nodule  outpatient follow-up/evaluation.   Code Status:  Full code -as indicated by MOST form per nursing Family Communication: Deferred  Status is: Inpatient  Remains inpatient appropriate because:Ongoing diagnostic testing needed not appropriate for outpatient work up and Inpatient level of care appropriate due to severity of illness  Dispo: The patient is from: ALF              Anticipated d/c is to: ALF              Patient currently is not medically stable to d/c.   Difficult to place patient No        Vernelle Emerald MD [facility] Pager [phone]  If 7PM-7AM, please contact night-coverage [email or web address] Use universal [place] password for that web site. If you do not have the password, please call the hospital operator.  02/07/2021, 3:36 AM

## 2021-02-06 NOTE — Consult Note (Signed)
Neurology Consultation  Reason for Consult: Stroke Referring Physician: Dr. Pattricia Boss, EDP Current medical record #[id number] Duplicate chart with a medical record number [id number]  CC: Fall  History is obtained from: Patient, chart, patient's daughter at bedside  HPI: Dorothy Barker is a 79 y.o. female Past medical history of atrial flutter on aspirin and apixaban, diabetes, hyperlipidemia, CKD, prior strokes without residual deficits, foot ulcer, presented to the emergency room for evaluation of a fall. She lives in a nursing facility that she was found down on the ground around 4:15 PM with her face and forehead hitting the ground.  No LOC.  No headache or seizure-like activity noted. Brought in for further evaluation of the fall to the emergency room. Head CT performed that shows a hypodensity in the left cerebellum without associated mass-effect or acute hemorrhage most likely subacute infarct with recommendations to follow this up with an MRI. Neurological consultation obtained for the possible stroke on the CT head. Patient denies any preceding illnesses or sicknesses. She tested positive for COVID-19 virus in the emergency room. Denies any respiratory issues. Denies any discoordination Reports walking with a walker and up to minimal because of left foot ulceration   LKW: Sometime before 4 PM today. tpa given?: no, outside the window Premorbid modified Rankin scale (mRS):4   ROS: Full ROS was performed and is negative except as noted in the HPI.  No past medical history on file. Past medical history documented in the HPI-reviewed from the other chart  No family history on file.   Social History:   reports that she has never smoked. She has never used smokeless tobacco. No history on file for alcohol use and drug use.  Medications  Current Facility-Administered Medications:    aspirin EC tablet 81 mg, 81 mg, Oral, Daily, Shalhoub, Sherryll Burger, MD   atorvastatin (LIPITOR)  tablet 80 mg, 80 mg, Oral, QHS, Shalhoub, Sherryll Burger, MD   [START ON 02/07/2021] thyroid (ARMOUR) tablet 60 mg, 60 mg, Oral, QAC breakfast, Shalhoub, Sherryll Burger, MD No current outpatient medications on file.   Exam: Current vital signs: BP (!) 155/95   Pulse (!) 101   Temp 98.8 F (37.1 C) (Oral)   Resp 19   SpO2 99%  Vital signs in last 24 hours: Temp:  [98.8 F (37.1 C)] 98.8 F (37.1 C) (06/19 1925) Pulse Rate:  [89-101] 101 (06/19 2030) Resp:  [18-19] 19 (06/19 2030) BP: (155-212)/(90-98) 155/95 (06/19 2030) SpO2:  [99 %] 99 % (06/19 2030) General: Awake alert in no distress HEENT: Right forehead bruising, bruising on the bridge of the nose CVS: Irregularly irregular Respiratory: Breathing well saturating normally on room air Abdomen nondistended nontender Extremities: Left foot in a bandage Neurological exam Awake alert oriented x3 Mild dysarthria noted No aphasia Cranial nerves examination: Pupils equal round reactive to light, extraocular movements intact, visual fields full, face appears symmetric, facial sensation intact, auditory acuity intact, tongue and palate midline. Motor examination with all 4 extremities antigravity with minimal drift in the left lower extremity-with some pain in the foot as well. Sensory exam: Intact to touch without extinction Coordination: Mild dysmetria on left finger-nose-finger and left heel-knee-shin testing NIH stroke scale-2  Labs I have reviewed labs in epic and the results pertinent to this consultation are:   CBC    Component Value Date/Time   WBC 9.6 02/06/2021 1919   RBC 4.09 02/06/2021 1919   HGB 12.2 02/06/2021 1941   HCT 36.0 02/06/2021 1941   PLT  208 02/06/2021 1919   MCV 90.2 02/06/2021 1919   MCH 29.1 02/06/2021 1919   MCHC 32.2 02/06/2021 1919   RDW 12.2 02/06/2021 1919    CMP     Component Value Date/Time   NA 135 02/06/2021 1941   K 3.6 02/06/2021 1941   CL 98 02/06/2021 1941   CO2 26 02/06/2021 1919    GLUCOSE 277 (H) 02/06/2021 1941   BUN 32 (H) 02/06/2021 1941   CREATININE 1.30 (H) 02/06/2021 1941   CALCIUM 9.9 02/06/2021 1919   PROT 6.5 02/06/2021 1919   ALBUMIN 3.1 (L) 02/06/2021 1919   AST 21 02/06/2021 1919   ALT 20 02/06/2021 1919   ALKPHOS 71 02/06/2021 1919   BILITOT 0.6 02/06/2021 1919   GFRNONAA 37 (L) 02/06/2021 1919   SARS-CoV-2 RT-PCR-positive  Imaging I have reviewed the images obtained: CT head and C-spine-with area of edema in the left cerebellum without associated mass-effect or acute hemorrhage-most likely subacute infarct. No acute fracture or static subluxation of C-spine. 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 Cerebellar edema/hypodensity is new-comparison MRI 01/04/2021 from the other MRN.  Assessment: 80 year old woman with above past medical history presenting for evaluation of a fall and CT scan demonstrating a subacute infarct in the left cerebellar hemisphere. Likely subacute stroke.  Less likely a mass. Currently on apixaban and aspirin.   Impression:  Subacute cerebellar stroke Less likely to be a mass COVID-19 infection  Recommendations: Admit to hospitalist Permissive hypertension-treat only if systolic blood pressure greater than 220 on a as needed basis. MRI brain without contrast as she has CKD-if concern for the cerebellar lesion being a mass, might have to consider doing a with contrast study. MRA head without contrast MRA neck without contrast or carotid Dopplers Aspirin only-hold apixaban for now for the concern for hemorrhagic transformation. 2D echo Lipid panel A1c PT OT Speech therapy Management of COVID-19 infection per primary team  Stroke team to follow  Plan discussed with Dr. Cyd Silence via secure chat  -- Amie Portland, MD Neurologist [facility] Pager: [phone]

## 2021-02-06 NOTE — ED Notes (Signed)
MRI called to verify if pt to be transported. Pt is alert and oriented. Denies claustrophobia. MRI stated it may be a two hour wait.

## 2021-02-06 NOTE — ED Provider Notes (Signed)
[facility] EMERGENCY DEPARTMENT Provider Note   CSN: [id number] Arrival date & time: 02/06/21  1913     History Chief Complaint  Patient presents with   Dorothy Barker is a 80 y.o. female.  The history is provided by the patient, the nursing home and the EMS personnel.  Fall This is a new problem. The current episode started 3 to 5 hours ago. The problem occurs constantly. The problem has been gradually improving. Associated symptoms include headaches. Pertinent negatives include no chest pain, no abdominal pain and no shortness of breath. Nothing aggravates the symptoms. Nothing relieves the symptoms.   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 Lives in assisted living facility.  Was inside her bathroom and says that her legs gave out at approximately 1615 causing her to fall forward onto her face hitting her forehead on the ground.  Does not think that she had LOC. Was initially ambulatory on scene.  Her daughter was on scene as well as the facility staff.  Initially did not want to come to the emergency department for evaluation. No reported seizure-like activity or focal neurologic deficits.  She was GCS 15 in route and daughter reported she was at her baseline mental and neurologic status.      No past medical history on file.  Patient Active Problem List   Diagnosis Date Noted   Stroke [facility]) 02/06/2021    The histories are not reviewed yet. Please review them in the "History" navigator section and refresh this [place].   OB History   No obstetric history on file.     No family history on file.     Home Medications Prior to Admission medications   Not on File    Allergies    Patient has no allergy information on record.  Review of Systems   Review of Systems  Constitutional:  Negative for chills and fever.  HENT:  Negative for ear pain and sore throat.   Eyes:  Negative for pain and visual disturbance.  Respiratory:  Negative for cough  and shortness of breath.   Cardiovascular:  Negative for chest pain and palpitations.  Gastrointestinal:  Negative for abdominal pain and vomiting.  Genitourinary:  Negative for dysuria and hematuria.  Musculoskeletal:  Negative for arthralgias and back pain.  Skin:  Positive for wound (hematoma central forehead). Negative for color change and rash.  Neurological:  Positive for headaches. Negative for seizures and syncope.  All other systems reviewed and are negative.  Physical Exam Updated Vital Signs BP (!) 155/95   Pulse (!) 101   Temp 98.8 F (37.1 C) (Oral)   Resp 19   SpO2 99%   Physical Exam Vitals and nursing note reviewed.  Constitutional:      General: She is not in acute distress.    Appearance: She is well-developed.  HENT:     Head: Normocephalic.     Jaw: There is normal jaw occlusion.      Nose:     Right Nostril: No epistaxis or septal hematoma.     Left Nostril: No epistaxis or septal hematoma.  Eyes:     Conjunctiva/sclera: Conjunctivae normal.  Cardiovascular:     Rate and Rhythm: Normal rate and regular rhythm.     Heart sounds: No murmur heard. Pulmonary:     Effort: Pulmonary effort is normal. No respiratory distress.     Breath sounds: Normal breath sounds.  Abdominal:     Palpations: Abdomen  is soft.     Tenderness: There is no abdominal tenderness.  Musculoskeletal:     Cervical back: No spinous process tenderness or muscular tenderness.  Skin:    General: Skin is warm and dry.  Neurological:     General: No focal deficit present.     Mental Status: She is alert and oriented to person, place, and time.     GCS: GCS eye subscore is 4. GCS verbal subscore is 5. GCS motor subscore is 6.     Cranial Nerves: Cranial nerves are intact.     Sensory: Sensation is intact. No sensory deficit.     Motor: Motor function is intact. No weakness, abnormal muscle tone or pronator drift.     Coordination: Finger-Nose-Finger Test normal.    ED Results /  Procedures / Treatments   Labs (all labs ordered are listed, but only abnormal results are displayed) Labs Reviewed  RESP PANEL BY RT-PCR (FLU A&B, COVID) ARPGX2 - Abnormal; Notable for the following components:      Result Value   SARS Coronavirus 2 by RT PCR POSITIVE (*)    All other components within normal limits  COMPREHENSIVE METABOLIC PANEL - Abnormal; Notable for the following components:   Sodium 133 (*)    Potassium 3.4 (*)    Chloride 96 (*)    Glucose, Bld 275 (*)    BUN 31 (*)    Creatinine, Ser 1.43 (*)    Albumin 3.1 (*)    GFR, Estimated 37 (*)    All other components within normal limits  CBC - Abnormal; Notable for the following components:   Hemoglobin 11.9 (*)    All other components within normal limits  PROTIME-INR - Abnormal; Notable for the following components:   Prothrombin Time 16.4 (*)    INR 1.3 (*)    All other components within normal limits  I-STAT CHEM 8, ED - Abnormal; Notable for the following components:   BUN 32 (*)    Creatinine, Ser 1.30 (*)    Glucose, Bld 277 (*)    All other components within normal limits  ETHANOL  LACTIC ACID, PLASMA  URINALYSIS, ROUTINE W REFLEX MICROSCOPIC  SAMPLE TO BLOOD BANK    EKG None  Radiology CT Head Wo Contrast  Result Date: 02/06/2021 CLINICAL DATA:  Fall EXAM: CT HEAD WITHOUT CONTRAST CT CERVICAL SPINE WITHOUT CONTRAST TECHNIQUE: Multidetector CT imaging of the head and cervical spine was performed following the standard protocol without intravenous contrast. Multiplanar CT image reconstructions of the cervical spine were also generated. COMPARISON:  None. FINDINGS: CT HEAD FINDINGS Brain: Area of edema in the left cerebellum measures approximately 4.0 x 3.5 cm. There is an old infarct of the right basal ganglia. There is periventricular hypoattenuation compatible with chronic microvascular disease. There is generalized atrophy without lobar predilection. No acute hemorrhage Vascular: No abnormal  hyperdensity of the major intracranial arteries or dural venous sinuses. No intracranial atherosclerosis. Skull: The visualized skull base, calvarium and extracranial soft tissues are normal. Sinuses/Orbits: No fluid levels or advanced mucosal thickening of the visualized paranasal sinuses. No mastoid or middle ear effusion. The orbits are normal. CT CERVICAL SPINE FINDINGS Alignment: No static subluxation. Facets are aligned. Occipital condyles are normally positioned. Skull base and vertebrae: No acute fracture. Soft tissues and spinal canal: No prevertebral fluid or swelling. No visible canal hematoma. Disc levels: Large central disc protrusion at C4-5 indents the ventral spinal cord. Small central disc protrusions also at C2-3 and C3-4 narrows  the ventral thecal sac. Upper chest: No pneumothorax, pulmonary nodule or pleural effusion. Other: Normal visualized paraspinal cervical soft tissues. IMPRESSION: 1. Area of edema in the left cerebellum without associated mass effect or acute hemorrhage, most likely a subacute infarct. MRI of the brain with and without contrast is recommended for further characterization and to exclude neoplasm. 2. No acute fracture or static subluxation of the cervical spine. 3. 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 Electronically Signed   By: Ulyses Jarred M.D.   On: 02/06/2021 20:01   CT Cervical Spine Wo Contrast  Result Date: 02/06/2021 CLINICAL DATA:  Fall EXAM: CT HEAD WITHOUT CONTRAST CT CERVICAL SPINE WITHOUT CONTRAST TECHNIQUE: Multidetector CT imaging of the head and cervical spine was performed following the standard protocol without intravenous contrast. Multiplanar CT image reconstructions of the cervical spine were also generated. COMPARISON:  None. FINDINGS: CT HEAD FINDINGS Brain: Area of edema in the left cerebellum measures approximately 4.0 x 3.5 cm. There is an old infarct of the right basal ganglia. There is periventricular  hypoattenuation compatible with chronic microvascular disease. There is generalized atrophy without lobar predilection. No acute hemorrhage Vascular: No abnormal hyperdensity of the major intracranial arteries or dural venous sinuses. No intracranial atherosclerosis. Skull: The visualized skull base, calvarium and extracranial soft tissues are normal. Sinuses/Orbits: No fluid levels or advanced mucosal thickening of the visualized paranasal sinuses. No mastoid or middle ear effusion. The orbits are normal. CT CERVICAL SPINE FINDINGS Alignment: No static subluxation. Facets are aligned. Occipital condyles are normally positioned. Skull base and vertebrae: No acute fracture. Soft tissues and spinal canal: No prevertebral fluid or swelling. No visible canal hematoma. Disc levels: Large central disc protrusion at C4-5 indents the ventral spinal cord. Small central disc protrusions also at C2-3 and C3-4 narrows the ventral thecal sac. Upper chest: No pneumothorax, pulmonary nodule or pleural effusion. Other: Normal visualized paraspinal cervical soft tissues. IMPRESSION: 1. Area of edema in the left cerebellum without associated mass effect or acute hemorrhage, most likely a subacute infarct. MRI of the brain with and without contrast is recommended for further characterization and to exclude neoplasm. 2. No acute fracture or static subluxation of the cervical spine. 3. 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 Electronically Signed   By: Ulyses Jarred M.D.   On: 02/06/2021 20:01   DG Pelvis Portable  Result Date: 02/06/2021 CLINICAL DATA:  Ground level fall EXAM: PORTABLE PELVIS 1-2 VIEWS COMPARISON:  None. FINDINGS: There is no evidence of pelvic fracture or diastasis. Degenerative changes of the bilateral hips, right worse than left. No pelvic bone lesions are seen. Advanced vascular calcifications are present. IMPRESSION: Negative. Electronically Signed   By: Davina Poke D.O.    On: 02/06/2021 20:29   DG Chest Port 1 View  Result Date: 02/06/2021 CLINICAL DATA:  Ground level fall EXAM: PORTABLE CHEST 1 VIEW COMPARISON:  None. FINDINGS: Heart size within normal limits. Low lung volumes. Blunting of the left costophrenic angle may reflect small pleural effusion versus prominent epicardial fat pad. 11 mm nodular density projects within the left upper lobe. Right lung is clear. No pneumothorax. No acute osseous abnormality. IMPRESSION: 1. Blunting of the left costophrenic angle may reflect small pleural effusion versus prominent epicardial fat pad. 2. 11 mm nodular density projects within the left upper lobe. Follow-up chest CT is recommended for further evaluation if recent outside imaging of the chest is unavailable. Electronically Signed   By: Hart Carwin  Plundo D.O.   On: 02/06/2021 20:26    Procedures Procedures   Medications Ordered in ED Medications - No data to display  ED Course  I have reviewed the triage vital signs and the nursing notes.  Pertinent labs & imaging results that were available during my care of the patient were reviewed by me and considered in my medical decision making (see chart for details).  Clinical Course as of 02/06/21 2130  Nancy Fetter Feb 06, 2021  2015 CT Head Wo Contrast Patient reports no symptoms of vertigo, dizziness, discoordination recently.  Again describes the fall as mechanical in nature without preceding symptoms. She does not ambulate normally. Uses a wheelchair. No evidence of ataxia at the time of my exam.  Repeat exam also unremarkable. [ZB]  2035 MR brain ordered to w/u subacute CVA. Patient will be admitted pending w/u. I also paged neurology to evaluate the patient. [ZB]  2036 BP(!): 212/98 Manual BP was 578 systolic [ZB]  4696 Spoke with neurology. They agree with plan for MR brain. Recommend holding Eliquis for now and that she get a baby ASA. [ZB]  2104 Spoke with hospitalist for admission. [ZB]    Clinical Course User  Index [ZB] Pearson Grippe, DO   MDM Rules/Calculators/A&P                          This is an 80 year old female with a history of previous CVA on DOAC who had a described mechanical ground-level fall from standing hitting her face on a hard floor approximately 3 hours prior to arrival. Arrived as a level 2 trauma activation.  In the emergency department she was hemodynamically stable with a nonfocal neurologic exam.  Her GCS was 15. She only complained of right knee pain, acute on chronic. Exam as above concerning for traumatic intracranial hemorrhage. CT head ordered.  See clinical course above for further medical decision-making.  Patient admitted for further subacute stroke work-up including MRI brain. Neurology consulted and will follow along. She remained hemodynamically stable while under my care in the emergency department without changes in mental or neurologic status.  Final Clinical Impression(s) / ED Diagnoses Final diagnoses:  Fall    Rx / DC Orders ED Discharge Orders     None        Pearson Grippe, DO 02/06/21 2131    Pattricia Boss, MD 02/07/21 0005

## 2021-02-06 NOTE — ED Notes (Signed)
Daughter is at the bedside.

## 2021-02-06 NOTE — ED Triage Notes (Signed)
Pt  brought in by [facility] via stretcher for assessment after ground level fall at local assisted living facility. Pt takes Eliquis from prior CVA. States right leg gave out and she hit her head and upper body on shower stall. Pt AOX4 upon arrival to ED.

## 2021-02-07 ENCOUNTER — Encounter (HOSPITAL_COMMUNITY): Payer: Self-pay | Admitting: Internal Medicine

## 2021-02-07 ENCOUNTER — Other Ambulatory Visit: Payer: Self-pay

## 2021-02-07 ENCOUNTER — Inpatient Hospital Stay (HOSPITAL_COMMUNITY): Payer: Medicare PPO

## 2021-02-07 ENCOUNTER — Other Ambulatory Visit (HOSPITAL_COMMUNITY): Payer: Self-pay

## 2021-02-07 ENCOUNTER — Inpatient Hospital Stay (HOSPITAL_BASED_OUTPATIENT_CLINIC_OR_DEPARTMENT_OTHER): Payer: Medicare PPO

## 2021-02-07 DIAGNOSIS — R911 Solitary pulmonary nodule: Secondary | ICD-10-CM | POA: Diagnosis present

## 2021-02-07 DIAGNOSIS — I639 Cerebral infarction, unspecified: Secondary | ICD-10-CM | POA: Diagnosis not present

## 2021-02-07 DIAGNOSIS — I6389 Other cerebral infarction: Secondary | ICD-10-CM | POA: Diagnosis not present

## 2021-02-07 DIAGNOSIS — U071 COVID-19: Secondary | ICD-10-CM | POA: Diagnosis present

## 2021-02-07 LAB — BASIC METABOLIC PANEL
Anion gap: 5 (ref 5–15)
BUN: 25 mg/dL — ABNORMAL HIGH (ref 8–23)
CO2: 29 mmol/L (ref 22–32)
Calcium: 9.6 mg/dL (ref 8.9–10.3)
Chloride: 102 mmol/L (ref 98–111)
Creatinine, Ser: 1.28 mg/dL — ABNORMAL HIGH (ref 0.44–1.00)
GFR, Estimated: 42 mL/min — ABNORMAL LOW (ref >60)
Glucose, Bld: 194 mg/dL — ABNORMAL HIGH (ref 70–99)
Potassium: 4 mmol/L (ref 3.5–5.1)
Sodium: 136 mmol/L (ref 135–145)

## 2021-02-07 LAB — CBC
HCT: 33.9 % — ABNORMAL LOW (ref 36.0–46.0)
Hemoglobin: 11.2 g/dL — ABNORMAL LOW (ref 12.0–15.0)
MCH: 29.9 pg (ref 26.0–34.0)
MCHC: 33 g/dL (ref 30.0–36.0)
MCV: 90.6 fL (ref 80.0–100.0)
Platelets: 189 10*3/uL (ref 150–400)
RBC: 3.74 MIL/uL — ABNORMAL LOW (ref 3.87–5.11)
RDW: 12.3 % (ref 11.5–15.5)
WBC: 6.9 10*3/uL (ref 4.0–10.5)
nRBC: 0 % (ref 0.0–0.2)

## 2021-02-07 LAB — URINALYSIS, ROUTINE W REFLEX MICROSCOPIC
Bilirubin Urine: NEGATIVE
Glucose, UA: 250 mg/dL — AB
Hgb urine dipstick: NEGATIVE
Ketones, ur: NEGATIVE mg/dL
Leukocytes,Ua: NEGATIVE
Nitrite: NEGATIVE
Protein, ur: >300 mg/dL — AB
Specific Gravity, Urine: 1.02 (ref 1.005–1.030)
pH: 7.5 (ref 5.0–8.0)

## 2021-02-07 LAB — URINALYSIS, MICROSCOPIC (REFLEX)

## 2021-02-07 LAB — LIPID PANEL
Cholesterol: 122 mg/dL (ref 0–200)
HDL: 45 mg/dL (ref >40)
LDL Cholesterol: 57 mg/dL (ref 0–99)
Total CHOL/HDL Ratio: 2.7 RATIO
Triglycerides: 98 mg/dL (ref <150)
VLDL: 20 mg/dL (ref 0–40)

## 2021-02-07 LAB — MAGNESIUM: Magnesium: 1.4 mg/dL — ABNORMAL LOW (ref 1.7–2.4)

## 2021-02-07 LAB — GLUCOSE, CAPILLARY
Glucose-Capillary: 174 mg/dL — ABNORMAL HIGH (ref 70–99)
Glucose-Capillary: 192 mg/dL — ABNORMAL HIGH (ref 70–99)
Glucose-Capillary: 177 mg/dL — ABNORMAL HIGH (ref 70–99)

## 2021-02-07 LAB — PROCALCITONIN: Procalcitonin: <0.1 ng/mL

## 2021-02-07 LAB — CBG MONITORING, ED
Glucose-Capillary: 211 mg/dL — ABNORMAL HIGH (ref 70–99)
Glucose-Capillary: 182 mg/dL — ABNORMAL HIGH (ref 70–99)

## 2021-02-07 LAB — C-REACTIVE PROTEIN: CRP: 0.8 mg/dL (ref <1.0)

## 2021-02-07 LAB — BRAIN NATRIURETIC PEPTIDE: B Natriuretic Peptide: 70.2 pg/mL (ref 0.0–100.0)

## 2021-02-07 LAB — ECHOCARDIOGRAM COMPLETE
Area-P 1/2: 5.13 cm2
S' Lateral: 2.5 cm

## 2021-02-07 MED ORDER — SODIUM CHLORIDE 0.9 % IV SOLN
100.0000 mg | Freq: Every day | INTRAVENOUS | Status: DC
  Administered 2021-02-08: 100 mg via INTRAVENOUS
  Filled 2021-02-07: qty 20

## 2021-02-07 MED ORDER — ASCORBIC ACID 500 MG PO TABS
500.0000 mg | ORAL_TABLET | Freq: Every day | ORAL | Status: DC
  Administered 2021-02-07 – 2021-02-08 (×2): 500 mg via ORAL
  Filled 2021-02-07 (×2): qty 1

## 2021-02-07 MED ORDER — INSULIN GLARGINE 100 UNIT/ML ~~LOC~~ SOLN
26.0000 [IU] | Freq: Every day | SUBCUTANEOUS | Status: DC
  Administered 2021-02-07: 26 [IU] via SUBCUTANEOUS
  Filled 2021-02-07 (×2): qty 0.26

## 2021-02-07 MED ORDER — DABIGATRAN ETEXILATE MESYLATE 150 MG PO CAPS
150.0000 mg | ORAL_CAPSULE | Freq: Two times a day (BID) | ORAL | Status: DC
  Administered 2021-02-07 – 2021-02-08 (×3): 150 mg via ORAL
  Filled 2021-02-07 (×4): qty 1

## 2021-02-07 MED ORDER — ZINC SULFATE 220 (50 ZN) MG PO CAPS
220.0000 mg | ORAL_CAPSULE | Freq: Every day | ORAL | Status: DC
  Administered 2021-02-07 – 2021-02-08 (×2): 220 mg via ORAL
  Filled 2021-02-07 (×2): qty 1

## 2021-02-07 MED ORDER — CARVEDILOL 3.125 MG PO TABS: 6.2500 mg | ORAL_TABLET | Freq: Two times a day (BID) | ORAL | Status: DC

## 2021-02-07 MED ORDER — CARVEDILOL 3.125 MG PO TABS
6.2500 mg | ORAL_TABLET | Freq: Two times a day (BID) | ORAL | Status: DC
  Administered 2021-02-07: 6.25 mg via ORAL
  Filled 2021-02-07: qty 2

## 2021-02-07 MED ORDER — IOHEXOL 350 MG/ML SOLN
50.0000 mL | Freq: Once | INTRAVENOUS | Status: AC | PRN
  Administered 2021-02-07: 50 mL via INTRAVENOUS

## 2021-02-07 MED ORDER — INSULIN GLARGINE 100 UNIT/ML ~~LOC~~ SOLN
10.0000 [IU] | Freq: Once | SUBCUTANEOUS | Status: AC
  Administered 2021-02-07: 10 [IU] via SUBCUTANEOUS
  Filled 2021-02-07: qty 0.1

## 2021-02-07 MED ORDER — POTASSIUM CHLORIDE CRYS ER 20 MEQ PO TBCR
40.0000 meq | EXTENDED_RELEASE_TABLET | Freq: Once | ORAL | Status: AC
  Administered 2021-02-07: 40 meq via ORAL
  Filled 2021-02-07: qty 2

## 2021-02-07 MED ORDER — PANTOPRAZOLE SODIUM 40 MG PO TBEC
40.0000 mg | DELAYED_RELEASE_TABLET | Freq: Every day | ORAL | Status: DC
  Administered 2021-02-07 – 2021-02-08 (×2): 40 mg via ORAL
  Filled 2021-02-07 (×2): qty 1

## 2021-02-07 MED ORDER — LACTATED RINGERS IV SOLN: INTRAVENOUS | Status: AC

## 2021-02-07 MED ORDER — MAGNESIUM SULFATE 2 GM/50ML IV SOLN
2.0000 g | Freq: Once | INTRAVENOUS | Status: AC
  Administered 2021-02-07: 2 g via INTRAVENOUS
  Filled 2021-02-07: qty 50

## 2021-02-07 MED ORDER — SODIUM CHLORIDE 0.9 % IV SOLN
200.0000 mg | Freq: Once | INTRAVENOUS | Status: AC
  Administered 2021-02-07: 200 mg via INTRAVENOUS
  Filled 2021-02-07: qty 40

## 2021-02-07 MED ORDER — GUAIFENESIN-DM 100-10 MG/5ML PO SYRP: 10.0000 mL | ORAL_SOLUTION | ORAL | Status: DC | PRN

## 2021-02-07 MED ORDER — ALBUTEROL SULFATE HFA 108 (90 BASE) MCG/ACT IN AERS
2.0000 | INHALATION_SPRAY | RESPIRATORY_TRACT | Status: DC | PRN
  Filled 2021-02-07: qty 6.7

## 2021-02-07 MED ORDER — CARVEDILOL 3.125 MG PO TABS: 3.1250 mg | ORAL_TABLET | Freq: Two times a day (BID) | ORAL | Status: DC

## 2021-02-07 MED ORDER — COLLAGENASE 250 UNIT/GM EX OINT
1.0000 "application " | TOPICAL_OINTMENT | Freq: Every day | CUTANEOUS | Status: DC
  Administered 2021-02-07 – 2021-02-08 (×2): 1 via TOPICAL
  Filled 2021-02-07: qty 30

## 2021-02-07 NOTE — Progress Notes (Signed)
  Echocardiogram 2D Echocardiogram has been performed.  Dorothy Barker 02/07/2021, 2:48 PM

## 2021-02-07 NOTE — ED Notes (Signed)
Jorje Guild daughter [phone]

## 2021-02-07 NOTE — TOC Benefit Eligibility Note (Signed)
Patient Teacher, English as a foreign language completed.    The patient is currently admitted and upon discharge could be taking Pradaxa 150 mg.  The current 30 day co-pay is, $64.00 due to being in the Coverage Gap (donut hole).   The patient is insured through [place], [place] Patient Advocate Specialist [place] Team Direct Number: [phone]  Fax: [phone]

## 2021-02-07 NOTE — ED Notes (Signed)
#  18g IV to LAC noted to red edematous and tender while infusing remdesivir. IV removed and cold compress applied. Extremity elevated. Provider and pharmacy made aware of findings. Will continue to monitor site for need of further intervention.

## 2021-02-07 NOTE — Progress Notes (Signed)
Dorothy, Barker ([id number]) Visit Report for 02/03/2021 Arrival Information Details Patient Name: Date of Service: Dorothy Barker, Dorothy Barker 02/03/2021 2:30 PM Medical Record Number: [id number] Patient Account Number: [id number] Date of Birth/Sex: Treating RN: [birth year] Barker (80 y.o. Dorothy Barker Primary Care Dorothy Barker: PCP, NO Other Clinician: Referring Dorothy Barker: Treating Dorothy Barker/Extender: Dorothy Barker MO RGA N, Dorothy Barker Weeks in Treatment: 14 Visit Information History Since Last Visit Added or deleted any medications: No Patient Arrived: Wheel Chair Any new allergies or adverse reactions: No Arrival Time: 14:26 Had a fall or experienced change in No Transfer Assistance: Manual activities of daily living that may affect Patient Identification Verified: Yes risk of falls: Secondary Verification Process Completed: Yes Signs or symptoms of abuse/neglect since last visito No Patient Requires Transmission-Based Precautions: No Hospitalized since last visit: No Patient Has Alerts: Yes Implantable device outside of the clinic excluding No Patient Alerts: Patient on Blood Thinner cellular tissue based products placed in the center since last visit: Has Dressing in Place as Prescribed: Yes Has Footwear/Offloading in Place as Prescribed: Yes Left: Wedge Barker Pain Present Now: No Electronic Signature(s) Signed: 02/03/2021 6:07:06 PM Barker: Dorothy Barker: Dorothy Barker on 02/03/2021 14:33:05 -------------------------------------------------------------------------------- Complex / Palliative Patient Assessment Details Patient Name: Date of Service: Dorothy Barker 02/03/2021 2:30 PM Medical Record Number: [id number] Patient Account Number: [id number] Date of Birth/Sex: Treating RN: Dorothy Barker (79 y.o. Dorothy Barker Primary Care Sahvanna Mcmanigal: PCP, NO Other Clinician: Referring Lainee Lehrman: Treating Tayari Yankee/Extender: Dorothy Barker MO RGA N, Dorothy Barker Weeks in Treatment: 14 Palliative  Management Criteria Complex Wound Management Criteria Patient has remarkable or complex co-morbidities requiring medications or treatments that extend wound healing times. Examples: Diabetes mellitus with chronic renal failure or end stage renal disease requiring dialysis Advanced or poorly controlled rheumatoid arthritis Diabetes mellitus and end stage chronic obstructive pulmonary disease Active cancer with current chemo- or radiation therapy Type 2 DM, PAD Care Approach Wound Care Plan: Complex Wound Management Electronic Signature(s) Signed: 02/04/2021 10:33:35 AM Barker: Dorothy Ham MD Signed: 02/07/2021 5:37:12 PM Barker: Dorothy Barker Entered Barker: Dorothy Barker on 02/04/2021 09:43:23 -------------------------------------------------------------------------------- Encounter Discharge Information Details Patient Name: Date of Service: Dorothy Barker 02/03/2021 2:30 PM Medical Record Number: [id number] Patient Account Number: [id number] Date of Birth/Sex: Treating RN: [birth year]Barker (80 y.o. Dorothy Barker Primary Care Dorothy Barker: PCP, NO Other Clinician: Referring Dorothy Barker: Treating Dorothy Barker/Extender: Dorothy Barker MO RGA N, Dorothy Barker Weeks in Treatment: 14 Encounter Discharge Information Items Post Procedure Vitals Discharge Condition: Stable Temperature (F): 97.4 Ambulatory Status: Wheelchair Pulse (bpm): 73 Discharge Destination: Home Respiratory Rate (breaths/min): 18 Transportation: Other Blood Pressure (mmHg): 177/86 Accompanied Barker: self Schedule Follow-up Appointment: Yes Clinical Summary of Care: Patient Declined Notes facility transportation Electronic Signature(s) Signed: 02/03/2021 5:31:32 PM Barker: Dorothy Gouty RN, Barker Entered Barker: Dorothy Barker on 02/03/2021 17:29:09 -------------------------------------------------------------------------------- Lower Extremity Assessment Details Patient Name: Date of Service: Dorothy Barker 02/03/2021 2:30 PM Medical  Record Number: [id number] Patient Account Number: [id number] Date of Birth/Sex: Treating RN: [birth year] (79 y.o. Dorothy Barker Primary Care Dorothy Barker: PCP, NO Other Clinician: Referring Dorothy Barker: Treating Dorothy Barker/Extender: Dorothy Barker MO RGA N, Dorothy Barker Weeks in Treatment: 14 Edema Assessment Assessed: [Left: Yes] [Right: No] Edema: [Left: Ye] [Right: s] Calf Left: Right: Point of Measurement: 28 cm From Medial Instep 34 cm Ankle Left: Right: Point of Measurement: 8 cm From Medial Instep 20 cm Vascular Assessment Pulses: Dorsalis Pedis Palpable: [Left:Yes] Electronic Signature(s) Signed: 02/03/2021 6:07:06 PM Barker: Dorothy Barker Signed: 02/03/2021 6:07:06 PM  Barker: Dorothy Barker: Dorothy Barker on 02/03/2021 14:39:04 -------------------------------------------------------------------------------- Multi Wound Chart Details Patient Name: Date of Service: Dorothy Barker 02/03/2021 2:30 PM Medical Record Number: [id number] Patient Account Number: [id number] Date of Birth/Sex: Treating RN: [birth year] (79 y.o. Dorothy Barker Primary Care Dorothy Barker: PCP, NO Other Clinician: Referring Dorothy Barker: Treating Dorothy Barker/Extender: Dorothy Barker MO RGA N, Dorothy Barker Weeks in Treatment: 14 Vital Signs Height(in): 62 Pulse(bpm): 73 Weight(lbs): 173 Blood Pressure(mmHg): 177/86 Body Mass Index(BMI): 32 Temperature(F): 97.4 Respiratory Rate(breaths/min): 18 Photos: [1:No Photos Left Calcaneus] [N/A:N/A N/A] Wound Location: [1:Pressure Injury] [N/A:N/A] Wounding Event: [1:Pressure Ulcer] [N/A:N/A] Primary Etiology: [1:Diabetic Wound/Ulcer of the Lower] [N/A:N/A] Secondary Etiology: [1:Extremity Cataracts, Hypertension, Type II] [N/A:N/A] Comorbid History: [1:Diabetes, Osteoarthritis, Dementia 09/21/2020] [N/A:N/A] Date Acquired: [1:14] [N/A:N/A] Weeks of Treatment: [1:Open] [N/A:N/A] Wound Status: [1:1.4x1.8x0.3] [N/A:N/A] Measurements L x W x D (cm) [1:1.979] [N/A:N/A] A  (cm) : rea [1:0.594] [N/A:N/A] Volume (cm) : [1:50.00%] [N/A:N/A] % Reduction in A [1:rea: -50.00%] [N/A:N/A] % Reduction in Volume: [1:Unstageable/Unclassified] [N/A:N/A] Classification: [1:Medium] [N/A:N/A] Exudate A mount: [1:Serosanguineous] [N/A:N/A] Exudate Type: [1:red, brown] [N/A:N/A] Exudate Color: [1:Distinct, outline attached] [N/A:N/A] Wound Margin: [1:Small (1-33%)] [N/A:N/A] Granulation A mount: [1:Red] [N/A:N/A] Granulation Quality: [1:Large (67-100%)] [N/A:N/A] Necrotic A mount: [1:Eschar] [N/A:N/A] Necrotic Tissue: [1:Fat Layer (Subcutaneous Tissue): Yes N/A] Exposed Structures: [1:Fascia: No Tendon: No Muscle: No Joint: No Bone: No Small (1-33%)] [N/A:N/A] Epithelialization: [1:Debridement - Excisional] [N/A:N/A] Debridement: Pre-procedure Verification/Time Out 14:50 [N/A:N/A] Taken: [1:Lidocaine 4% Topical Solution] [N/A:N/A] Pain Control: [1:Necrotic/Eschar, Subcutaneous,] [N/A:N/A] Tissue Debrided: [1:Slough Skin/Subcutaneous Tissue] [N/A:N/A] Level: [1:2.52] [N/A:N/A] Debridement A (sq cm): [1:rea Curette] [N/A:N/A] Instrument: [1:Minimum] [N/A:N/A] Bleeding: [1:Pressure] [N/A:N/A] Hemostasis Achieved: [1:0] [N/A:N/A] Procedural Pain: [1:0] [N/A:N/A] Post Procedural Pain: Debridement Treatment Response: Procedure was tolerated well [N/A:N/A] Post Debridement Measurements L x 1.4x1.8x0.3 [N/A:N/A] W x D (cm) [1:0.594] [N/A:N/A] Post Debridement Volume: (cm) [1:Unstageable/Unclassified] [N/A:N/A] Post Debridement Stage: [1:Debridement] [N/A:N/A] Procedures Performed: Treatment Notes Electronic Signature(s) Signed: 02/03/2021 5:31:56 PM Barker: Deon Pilling Signed: 02/04/2021 10:33:35 AM Barker: Dorothy Ham MD Entered Barker: Dorothy Barker on 02/03/2021 14:55:50 -------------------------------------------------------------------------------- Multi-Disciplinary Care Plan Details Patient Name: Date of Service: Dorothy Barker 02/03/2021 2:30 PM Medical Record  Number: [id number] Patient Account Number: [id number] Date of Birth/Sex: Treating RN: [birth year] Barker (80 y.o. [name] Primary Care Atthew Coutant: PCP, NO Other Clinician: Referring Charniece Venturino: Treating Derrion Tritz/Extender: Dorothy Barker MO RGA N, Dorothy Barker Weeks in Treatment: 14 Active Inactive Wound/Skin Impairment Nursing Diagnoses: Impaired tissue integrity Knowledge deficit related to ulceration/compromised skin integrity Goals: Patient will have a decrease in wound volume Barker X% from date: (specify in notes) Date Initiated: 10/26/2020 Target Resolution Date: 02/18/2021 Goal Status: Active Patient/caregiver will verbalize understanding of skin care regimen Date Initiated: 10/26/2020 Target Resolution Date: 02/17/2021 Goal Status: Active Ulcer/skin breakdown will have a volume reduction of 30% Barker week 4 Date Initiated: 10/26/2020 Date Inactivated: 12/09/2020 Target Resolution Date: 12/09/2020 Goal Status: Unmet Unmet Reason: no major changes. Interventions: Assess patient/caregiver ability to obtain necessary supplies Assess patient/caregiver ability to perform ulcer/skin care regimen upon admission and as needed Assess ulceration(s) every visit Notes: Electronic Signature(s) Signed: 02/03/2021 5:31:56 PM Barker: Deon Pilling Entered Barker: Deon Pilling on 02/03/2021 14:47:51 -------------------------------------------------------------------------------- Pain Assessment Details Patient Name: Date of Service: Dorothy Barker, Dorothy Barker 02/03/2021 2:30 PM Medical Record Number: [id number] Patient Account Number: [id number] Date of Birth/Sex: Treating RN: [birth year]Barker (80 y.o. Dorothy Barker Primary Care Alynn Ellithorpe: PCP, NO Other Clinician: Referring Tresea Heine: Treating Brinae Woods/Extender: Dorothy Barker MO RGA N, Dorothy Barker Weeks in Treatment: 14 Active Problems Location  of Pain Severity and Description of Pain Patient Has Paino No Site Locations Pain Management and Medication Current Pain  Management: Electronic Signature(s) Signed: 02/03/2021 6:07:06 PM Barker: Dorothy Barker: Dorothy Barker on 02/03/2021 14:33:54 -------------------------------------------------------------------------------- Patient/Caregiver Education Details Patient Name: Date of Service: Dorothy Barker 6/16/2022andnbsp2:30 PM Medical Record Number: [id number] Patient Account Number: [id number] Date of Birth/Gender: Treating RN: [birth year] Barker (80 y.o. [name] Primary Care Physician: PCP, NO Other Clinician: Referring Physician: Treating Physician/Extender: Dorothy Barker MO RGA N, Dorothy Barker Weeks in Treatment: 14 Education Assessment Education Provided To: Patient Education Topics Provided Wound/Skin Impairment: Handouts: Skin Care Do's and Dont's Methods: Explain/Verbal Responses: Reinforcements needed Electronic Signature(s) Signed: 02/03/2021 5:31:56 PM Barker: Deon Pilling Entered Barker: Deon Pilling on 02/03/2021 14:49:19 -------------------------------------------------------------------------------- Wound Assessment Details Patient Name: Date of Service: Dorothy Barker, Dorothy Barker 02/03/2021 2:30 PM Medical Record Number: [id number] Patient Account Number: [id number] Date of Birth/Sex: Treating RN: [birth year]Barker (79 y.o. 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 Secondary Diabetic Wound/Ulcer of the Lower Extremity Etiology: Wounding Event: Pressure Injury Wound Status: Open Date Acquired: 09/21/2020 Comorbid Cataracts, Hypertension, Type II Diabetes, Osteoarthritis, Weeks Of Treatment: 14 History: Dementia Clustered Wound: No Photos Wound Measurements Length: (cm) 1.4 Width: (cm) 1.8 Depth: (cm) 0.3 Area: (cm) 1.979 Volume: (cm) 0.594 % Reduction in Area:  50% % Reduction in Volume: -50% Epithelialization: Small (1-33%) Tunneling: No Undermining: No Wound Description Classification: Unstageable/Unclassified Wound Margin: Distinct, outline attached Exudate Amount: Medium Exudate Type: Serosanguineous Exudate Color: red, brown Foul Odor After Cleansing: No Slough/Fibrino No Wound Bed Granulation Amount: Small (1-33%) Exposed Structure Granulation Quality: Red Fascia Exposed: No Necrotic Amount: Large (67-100%) Fat Layer (Subcutaneous Tissue) Exposed: Yes Necrotic Quality: Eschar Tendon Exposed: No Muscle Exposed: No Joint Exposed: No Bone Exposed: No Treatment Notes Wound #1 (Calcaneus) Wound Laterality: Left Cleanser Wound Cleanser Discharge Instruction: Cleanse the wound with wound cleanser prior to applying a clean dressing using gauze sponges, not tissue or cotton balls. Soap and Water Discharge Instruction: May shower and wash wound with dial antibacterial soap and water prior to dressing change. Peri-Wound Care Topical Primary Dressing Santyl Ointment Discharge Instruction: Apply nickel thick amount to wound bed as instructed Secondary Dressing Woven Gauze Sponge, Non-Sterile 4x4 in Discharge Instruction: Apply over primary dressing as directed. ABD Pad, 5x9 Discharge Instruction: Apply over primary dressing as directed. Secured With [employer], 4.5x3.1 (in/yd) Discharge Instruction: Secure with Kerlix as directed. 83M Medipore H Soft Cloth Surgical T 4 x 2 (in/yd) ape Discharge Instruction: Secure dressing with tape as directed. Compression Wrap Compression Stockings Add-Ons Electronic Signature(s) Signed: 02/03/2021 5:21:02 PM Barker: Sandre Kitty Signed: 02/03/2021 6:07:06 PM Barker: Dorothy Barker: Sandre Kitty on 02/03/2021 16:57:27 -------------------------------------------------------------------------------- Vitals Details Patient Name: Date of Service: Dorothy Barker 02/03/2021  2:30 PM Medical Record Number: [id number] Patient Account Number: [id number] Date of Birth/Sex: Treating RN: [birth year]Barker (80 y.o. Dorothy Barker Primary Care Thora Scherman: PCP, NO Other Clinician: Referring Tessi Eustache: Treating Kammi Hechler/Extender: Dorothy Barker MO RGA N, Dorothy Barker Weeks in Treatment: 14 Vital Signs Time Taken: 14:33 Temperature (F): 97.4 Height (in): 62 Pulse (bpm): 73 Weight (lbs): 173 Respiratory Rate (breaths/min): 18 Body Mass Index (BMI): 31.6 Blood Pressure (mmHg): 177/86 Reference Range: 80 - 120 mg / dl Electronic Signature(s) Signed: 02/03/2021 6:07:06 PM Barker: Dorothy Barker: Onnie Boer  Jodi on 02/03/2021 14:33:24

## 2021-02-07 NOTE — Progress Notes (Signed)
STROKE TEAM PROGRESS NOTE   INTERVAL HISTORY No visitors at bedside. Patient is lying on ED stretcher in NAD. She reports she resides at [facility] where she fell yesterday. She does not recall the events well. Facility administers her medications and she does recall taking Eliquis.  We discussed her stroke diagnosis, ongoing plan of care and work up.   Vitals:   02/07/21 0700 02/07/21 0715 02/07/21 0730 02/07/21 0801  BP: (!) 161/66 (!) 172/58 (!) 176/63   Pulse: 78 79 82   Resp: 18 20 19    Temp:    98.7 F (37.1 C)  TempSrc:    Oral  SpO2: 98% 94% 97%    CBC:  Recent Labs  Lab 02/06/21 1919 02/06/21 1941  WBC 9.6  --   HGB 11.9* 12.2  HCT 36.9 36.0  MCV 90.2  --   PLT 208  --    Basic Metabolic Panel:  Recent Labs  Lab 02/06/21 1919 02/06/21 1941  NA 133* 135  K 3.4* 3.6  CL 96* 98  CO2 26  --   GLUCOSE 275* 277*  BUN 31* 32*  CREATININE 1.43* 1.30*  CALCIUM 9.9  --    Lipid Panel: No results for input(s): CHOL, TRIG, HDL, CHOLHDL, VLDL, LDLCALC in the last 168 hours. HgbA1c: No results for input(s): HGBA1C in the last 168 hours. Urine Drug Screen: No results for input(s): LABOPIA, COCAINSCRNUR, LABBENZ, AMPHETMU, THCU, LABBARB in the last 168 hours.  Alcohol Level  Recent Labs  Lab 02/06/21 1919  ETH <10    IMAGING past 24 hours CT Head Wo Contrast  Result Date: 02/06/2021 CLINICAL DATA:  Fall EXAM: CT HEAD WITHOUT CONTRAST CT CERVICAL SPINE WITHOUT CONTRAST TECHNIQUE: Multidetector CT imaging of the head and cervical spine was performed following the standard protocol without intravenous contrast. Multiplanar CT image reconstructions of the cervical spine were also generated. COMPARISON:  None. FINDINGS: CT HEAD FINDINGS Brain: Area of edema in the left cerebellum measures approximately 4.0 x 3.5 cm. There is an old infarct of the right basal ganglia. There is periventricular hypoattenuation compatible with chronic microvascular disease. There is generalized atrophy  without lobar predilection. No acute hemorrhage Vascular: No abnormal hyperdensity of the major intracranial arteries or dural venous sinuses. No intracranial atherosclerosis. Skull: The visualized skull base, calvarium and extracranial soft tissues are normal. Sinuses/Orbits: No fluid levels or advanced mucosal thickening of the visualized paranasal sinuses. No mastoid or middle ear effusion. The orbits are normal. CT CERVICAL SPINE FINDINGS Alignment: No static subluxation. Facets are aligned. Occipital condyles are normally positioned. Skull base and vertebrae: No acute fracture. Soft tissues and spinal canal: No prevertebral fluid or swelling. No visible canal hematoma. Disc levels: Large central disc protrusion at C4-5 indents the ventral spinal cord. Small central disc protrusions also at C2-3 and C3-4 narrows the ventral thecal sac. Upper chest: No pneumothorax, pulmonary nodule or pleural effusion. Other: Normal visualized paraspinal cervical soft tissues. IMPRESSION: 1. Area of edema in the left cerebellum without associated mass effect or acute hemorrhage, most likely a subacute infarct. MRI of the brain with and without contrast is recommended for further characterization and to exclude neoplasm. 2. No acute fracture or static subluxation of the cervical spine. 3. 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 Electronically Signed   By: Ulyses Jarred M.D.   On: 02/06/2021 20:01   CT Cervical Spine Wo Contrast  Result Date: 02/06/2021 CLINICAL DATA:  Fall EXAM: CT HEAD WITHOUT CONTRAST  CT CERVICAL SPINE WITHOUT CONTRAST TECHNIQUE: Multidetector CT imaging of the head and cervical spine was performed following the standard protocol without intravenous contrast. Multiplanar CT image reconstructions of the cervical spine were also generated. COMPARISON:  None. FINDINGS: CT HEAD FINDINGS Brain: Area of edema in the left cerebellum measures approximately 4.0 x 3.5 cm. There is an  old infarct of the right basal ganglia. There is periventricular hypoattenuation compatible with chronic microvascular disease. There is generalized atrophy without lobar predilection. No acute hemorrhage Vascular: No abnormal hyperdensity of the major intracranial arteries or dural venous sinuses. No intracranial atherosclerosis. Skull: The visualized skull base, calvarium and extracranial soft tissues are normal. Sinuses/Orbits: No fluid levels or advanced mucosal thickening of the visualized paranasal sinuses. No mastoid or middle ear effusion. The orbits are normal. CT CERVICAL SPINE FINDINGS Alignment: No static subluxation. Facets are aligned. Occipital condyles are normally positioned. Skull base and vertebrae: No acute fracture. Soft tissues and spinal canal: No prevertebral fluid or swelling. No visible canal hematoma. Disc levels: Large central disc protrusion at C4-5 indents the ventral spinal cord. Small central disc protrusions also at C2-3 and C3-4 narrows the ventral thecal sac. Upper chest: No pneumothorax, pulmonary nodule or pleural effusion. Other: Normal visualized paraspinal cervical soft tissues. IMPRESSION: 1. Area of edema in the left cerebellum without associated mass effect or acute hemorrhage, most likely a subacute infarct. MRI of the brain with and without contrast is recommended for further characterization and to exclude neoplasm. 2. No acute fracture or static subluxation of the cervical spine. 3. 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 Electronically Signed   By: Ulyses Jarred M.D.   On: 02/06/2021 20:01   MR BRAIN WO CONTRAST  Result Date: 02/07/2021 CLINICAL DATA:  Stroke follow-up EXAM: MRI HEAD WITHOUT CONTRAST TECHNIQUE: Multiplanar, multiecho pulse sequences of the brain and surrounding structures were obtained without intravenous contrast. COMPARISON:  01/04/2021 FINDINGS: Brain: Large acute infarct of the left cerebellum. Old right basal  ganglia infarct. No acute or chronic hemorrhage. There is multifocal hyperintense T2-weighted signal within the white matter. Generalized volume loss without a clear lobar predilection. The midline structures are normal. Vascular: Major flow voids are preserved. Skull and upper cervical spine: Normal calvarium and skull base. Visualized upper cervical spine and soft tissues are normal. Sinuses/Orbits:Left maxillary retention cyst no mastoid or middle ear effusion. Normal orbits. IMPRESSION: 1. Large acute left cerebellar infarct. No hemorrhage or mass effect. 2. Old right basal ganglia infarct and findings of chronic microvascular ischemia. Electronically Signed   By: Ulyses Jarred M.D.   On: 02/07/2021 02:18   DG Pelvis Portable  Result Date: 02/06/2021 CLINICAL DATA:  Ground level fall EXAM: PORTABLE PELVIS 1-2 VIEWS COMPARISON:  None. FINDINGS: There is no evidence of pelvic fracture or diastasis. Degenerative changes of the bilateral hips, right worse than left. No pelvic bone lesions are seen. Advanced vascular calcifications are present. IMPRESSION: Negative. Electronically Signed   By: Davina Poke D.O.   On: 02/06/2021 20:29   DG Chest Port 1 View  Result Date: 02/06/2021 CLINICAL DATA:  Ground level fall EXAM: PORTABLE CHEST 1 VIEW COMPARISON:  None. FINDINGS: Heart size within normal limits. Low lung volumes. Blunting of the left costophrenic angle may reflect small pleural effusion versus prominent epicardial fat pad. 11 mm nodular density projects within the left upper lobe. Right lung is clear. No pneumothorax. No acute osseous abnormality. IMPRESSION: 1. Blunting of the left costophrenic angle may reflect small  pleural effusion versus prominent epicardial fat pad. 2. 11 mm nodular density projects within the left upper lobe. Follow-up chest CT is recommended for further evaluation if recent outside imaging of the chest is unavailable. Electronically Signed   By: Davina Poke D.O.   On:  02/06/2021 20:26    PHYSICAL EXAM General: Frail elderly Caucasian lady not in distress.  Awake alert in no distress HEENT: Right forehead bruising, bruising on the bridge of the nose CVS: Irregularly irregular Respiratory: Breathing well saturating normally on room air Abdomen nondistended nontender Extremities: Left foot in a bandage Neurological exam Awake alert oriented x3 Mild dysarthria noted No aphasia Cranial nerves examination: Pupils equal round reactive to light, extraocular movements intact, visual fields full, face appears symmetric, facial sensation intact, auditory acuity intact, tongue and palate midline. Motor examination with all 4 extremities antigravity with minimal drift in the left lower extremity-with some pain in the foot as well.  Mild diminished fine finger movements on the left and orbits right over left upper extremity. Sensory exam: Intact to touch without extinction Coordination: Mild dysmetria on left finger-nose-finger and left heel-knee-shin testing  ASSESSMENT/PLAN 80 year old woman with of PMH of atrial flutter on aspirin and apixaban, diabetes, hyperlipidemia, CKD, prior strokes without residual deficits, and foot ulcer presenting for evaluation of a fall at her nursing facility and CT scan demonstrating a subacute infarct in the left cerebellar hemisphere. She tested positive for COVID 19 incidentally in the ED.   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 CT  C spine No acute fracture or static subluxation of C-spine CTA head 1. Severe stenosis versus occlusion of the proximal left AICA with distal opacification. A left PICA is not visualized which could represent AICA dominance vs proximal  PICA stenosis/occlusion. 2. Severe distal right P1 PCA and severe multifocal left P2 PCA stenosis. 3. Moderate to severe stenosis of the bilateral M1 MCAs. CTA neck: Left greater than right carotid bifurcation atherosclerosis without greater than 50% stenosis. 2. Great vessel origins are not imaged. MRI   Large acute left cerebellar infarct. No hemorrhage or mass effect. 2. Old right basal ganglia infarct and findings of chronic microvascular ischemia. 2D Echo: done with read Pending LDL Pending HgbA1c Pending VTE prophylaxis - on pradaxa    Diet   Diet heart healthy/carb modified Room service appropriate? Yes; Fluid consistency: Thin   On ASA 81 and Eliquis prior to admission Pradaxa is recommended.  Therapy recommendations:  Pending Disposition:  TBD  Hypertension Stable Permissive hypertension (OK if < 220/120) but gradually normalize in 5-7 days Long-term BP goal normotensive  Hyperlipidemia Home meds:  Lipitor 40mg   LDL pending goal < 70 Continue statin at discharge  Diabetes type II  HgbA1c Pending goal < 7.0 CBGs Recent Labs    02/07/21 0356  GLUCAP 211*    SSI  Other Stroke Risk Factors Advanced Age >/= 67  Obesity, There is no height or weight on file to calculate BMI., BMI >/= 30 associated with increased stroke risk, recommend weight loss, diet and exercise as appropriate  Hx stroke/TIA Coronary artery disease Migraines  Congestive heart failure  Other Active Problems   Hospital day # 1 I have personally obtained history,examined this patient, reviewed notes, independently viewed imaging studies, participated in medical  decision making and plan of care.ROS completed by me personally and pertinent positives fully documented  I have made any additions or clarifications directly to the above note. Agree with note above.  Patient has presented with a large left cerebellar infarct likely of embolic etiology from her atrial flutter despite being on  anticoagulation with Eliquis in the setting of acute COVID illness.  It is unclear whether continuing Eliquis or switching to alternative agent like Pradaxa is necessarily so.  And would like to check her co-pay from her insurance prior to considering such a switch.  Check ongoing stroke work-up with CT angiograms as well as echocardiogram.  No family available for discussion.  Discussed with Dr. Candiss Norse.  Greater than 50% time during this 35-minute visit was spent in counseling and coordination of care about her embolic stroke and discussion about stroke prevention and answering questions and discussion with care team.  Antony Contras, MD Medical Director [facility] Pager: [phone] 02/07/2021 6:29 PM    To contact Stroke Continuity provider, please refer to [email or web address] After hours, contact General Neurology

## 2021-02-07 NOTE — ED Notes (Signed)
Patient transported to MRI 

## 2021-02-07 NOTE — Consult Note (Signed)
WOC Nurse Consult Note: Reason for Consult: left foot wound Wound type:Unstageable Pressure Injury Pressure Injury POA: Yes Measurement: 1cm x 1cm x 0.2cm  Wound bed:100% grey/black slough Drainage (amount, consistency, odor) minimal, no odor Periwound: intact  Dressing procedure/placement/frequency: Enzymatic debridement ointment daily, saline topper, dry dressing Offload heel at all times. Patient's daughter reports Prevalon boots at the facility.  They will either bring in or can use pillows to avoid expense of another boot. Verbalized understanding  Discussed POC with patient and bedside nurse.  Re consult if needed, will not follow at this time. Thanks  Adalay Azucena [employer], RN,CWOCN, CNS, [place] [phone])

## 2021-02-07 NOTE — ED Notes (Signed)
Attempted to give reportx1 

## 2021-02-07 NOTE — Progress Notes (Signed)
Pt arrived on unit. Aox4. Belongings noted in chart. Call bell placed within reach. Bed alarm on. Will continue to monitor.

## 2021-02-07 NOTE — Evaluation (Signed)
Physical Therapy Evaluation Patient Details Name: Dorothy Barker MRN: [id number] DOB: [birth year] Today's Date: 02/07/2021   History of Present Illness  80yo female admitted 02/06/21 after a fall at her ALF. Incidentally Covid positive and MRI with acute L cerebellar infarct. PMH CKD, HTN, A-fib, DM, L heel wound  Clinical Impression   Patient received in bed, pleasant and cooperative. Able to mobilize with MinA in general and increased time/effort; did have mild dizziness with transition changes but this improved quickly with rest breaks. Able to transfer to recliner with RW and reports being fairly close to her baseline level of performance. Left up in recliner with daughter present, RN aware of patient status- will benefit from ongoing PT f/u at her ALF at [place].     Follow Up Recommendations Home health PT;Other (comment);Supervision for mobility/OOB (PT in ALF)    Equipment Recommendations  3in1 (PT) (has WC and RW)    Recommendations for Other Services       Precautions / Restrictions Precautions Precautions: Fall;Other (comment) Precaution Comments: covid +,  WB through ball of L foot in heel offloading shoe (no weight down through L heel due to wound) Restrictions Weight Bearing Restrictions: No Other Position/Activity Restrictions: L heel offloading shoe      Mobility  Bed Mobility Overal bed mobility: Needs Assistance Bed Mobility: Supine to Sit     Supine to sit: Min assist;HOB elevated     General bed mobility comments: MinA to scoot hips around to being square at EOB, able to maintain midline sitting without difficulty from there    Transfers Overall transfer level: Needs assistance Equipment used: Rolling walker (2 wheeled) Transfers: Sit to/from [facility] Sit to Stand: Min assist Stand pivot transfers: Min assist       General transfer comment: MinA to boost to full upright with VC for hand placement/sequencing, then MinA to pivot over to  recliner with RW/L heel offloading shoe and sneaker on other foot  Ambulation/Gait             General Gait Details: deferred- has been transfer level only since March due to heel wound  Stairs            Wheelchair Mobility    Modified Rankin (Stroke Patients Only)       Balance Overall balance assessment: History of Falls;Needs assistance Sitting-balance support: Bilateral upper extremity supported;Feet supported Sitting balance-Leahy Scale: Good     Standing balance support: Bilateral upper extremity supported;During functional activity Standing balance-Leahy Scale: Poor Standing balance comment: reliant on BUE support and external assist                             Pertinent Vitals/Pain Pain Assessment: No/denies pain    Home Living Family/patient expects to be discharged to:: Assisted living               Home Equipment: Walker - 2 wheels;Wheelchair - manual Additional Comments: hasn't really used walker since early March- L heel wound was debrided and became very painful    Prior Function           Comments: totalA for bathing/dressing, ALF has cafeteria; varies from a little bit of help to a lot of help     Hand Dominance        Extremity/Trunk Assessment   Upper Extremity Assessment Upper Extremity Assessment: Defer to OT evaluation    Lower Extremity Assessment Lower Extremity Assessment: Generalized weakness  Cervical / Trunk Assessment Cervical / Trunk Assessment: Kyphotic  Communication   Communication: No difficulties  Cognition Arousal/Alertness: Awake/alert Behavior During Therapy: WFL for tasks assessed/performed Overall Cognitive Status: Within Functional Limits for tasks assessed                                        General Comments General comments (skin integrity, edema, etc.): VSS on RA    Exercises     Assessment/Plan    PT Assessment Patient needs continued PT services   PT Problem List Decreased strength;Decreased knowledge of use of DME;Decreased activity tolerance;Decreased safety awareness;Decreased balance;Decreased knowledge of precautions;Decreased mobility;Decreased coordination       PT Treatment Interventions DME instruction;Balance training;Gait training;Functional mobility training;Patient/family education;Therapeutic activities;Wheelchair mobility training;Therapeutic exercise    PT Goals (Current goals can be found in the Care Plan section)  Acute Rehab PT Goals Patient Stated Goal: go home, no more falls PT Goal Formulation: With patient/family Time For Goal Achievement: 02/21/21 Potential to Achieve Goals: Good    Frequency Min 3X/week   Barriers to discharge        Co-evaluation               AM-PAC PT "6 Clicks" Mobility  Outcome Measure Help needed turning from your back to your side while in a flat bed without using bedrails?: None Help needed moving from lying on your back to sitting on the side of a flat bed without using bedrails?: A Little Help needed moving to and from a bed to a chair (including a wheelchair)?: A Little Help needed standing up from a chair using your arms (e.g., wheelchair or bedside chair)?: A Little Help needed to walk in hospital room?: Total Help needed climbing 3-5 steps with a railing? : Total 6 Click Score: 15    End of Session   Activity Tolerance: Patient tolerated treatment well Patient left: in chair;with call bell/phone within reach;with family/visitor present Nurse Communication: Mobility status;Precautions PT Visit Diagnosis: Unsteadiness on feet (R26.81);Difficulty in walking, not elsewhere classified (R26.2);History of falling (Z91.81);Muscle weakness (generalized) (M62.81)    Time: 0786-7544 PT Time Calculation (min) (ACUTE ONLY): 23 min   Charges:   PT Evaluation $PT Eval Moderate Complexity: 1 Mod PT Treatments $Therapeutic Activity: 8-22 mins       Windell Norfolk,  DPT, PN1   Supplemental Physical Therapist [facility]    Pager [phone] Acute Rehab Office [phone]

## 2021-02-07 NOTE — Discharge Instructions (Addendum)
Follow with Primary MD in 7 days   Get CBC, CMP, 2 view Chest X ray -  checked next visit within 1 week by Primary MD or SNF MD    Activity: As tolerated with Full fall precautions use walker/cane & assistance as needed  Disposition ALF  Diet: Heart Healthy Low Carb  Accuchecks 4 times/day, Once in AM empty stomach and then before each meal. Log in all results and show them to your Prim.MD in 3 days. If any glucose reading is under 80 or above 300 call your Prim MD immidiately. Follow Low glucose instructions for glucose under 80 as instructed.   Special Instructions: If you have smoked or chewed Tobacco  in the last 2 yrs please stop smoking, stop any regular Alcohol  and or any Recreational drug use.  On your next visit with your primary care physician please Get Medicines reviewed and adjusted.  Please request your Prim.MD to go over all Hospital Tests and Procedure/Radiological results at the follow up, please get all Hospital records sent to your Prim MD by signing hospital release before you go home.  If you experience worsening of your admission symptoms, develop shortness of breath, life threatening emergency, suicidal or homicidal thoughts you must seek medical attention immediately by calling 911 or calling your MD immediately  if symptoms less severe.  You Must read complete instructions/literature along with all the possible adverse reactions/side effects for all the Medicines you take and that have been prescribed to you. Take any new Medicines after you have completely understood and accpet all the possible adverse reactions/side effects.    Information on my medicine - Pradaxa (dabigatran)  This medication education was reviewed with me or my healthcare representative as part of my discharge preparation.   Why was Pradaxa prescribed for you? Pradaxa was prescribed for you to reduce the risk of forming blood clots that cause a stroke if you have a medical condition  called atrial fibrillation (a type of irregular heartbeat).    What do you Need to know about PradAXa? Take your Pradaxa TWICE DAILY - one capsule in the morning and one tablet in the evening with or without food.  It would be best to take the doses about the same time each day.  The capsules should not be broken, chewed or opened - they must be swallowed whole.  Do not store Pradaxa in other medication containers - once the bottle is opened the Pradaxa should be used within FOUR months; throw away any capsules that haven't been by that time.  Take Pradaxa exactly as prescribed by your doctor.  DO NOT stop taking Pradaxa without talking to the doctor who prescribed the medication.  Stopping without other stroke prevention medication to take the place of Pradaxa may increase your risk of developing a clot that causes a stroke.  Refill your prescription before you run out.  After discharge, you should have regular check-up appointments with your healthcare provider that is prescribing your Pradaxa.  In the future your dose may need to be changed if your kidney function or weight changes by a significant amount.  What do you do if you miss a dose? If you miss a dose, take it as soon as you remember on the same day.  If your next dose is less than 6 hours away, skip the missed dose.  Do not take two doses of PRADAXA at the same time.  Important Safety Information A possible side effect of Pradaxa is bleeding.  You should call your healthcare provider right away if you experience any of the following: Bleeding from an injury or your nose that does not stop. Unusual colored urine (red or dark brown) or unusual colored stools (red or black). Unusual bruising for unknown reasons. A serious fall or if you hit your head (even if there is no bleeding).  Some medicines may interact with Pradaxa and might increase your risk of bleeding or clotting while on Pradaxa. To help avoid this, consult your  healthcare provider or pharmacist prior to using any new prescription or non-prescription medications, including herbals, vitamins, non-steroidal anti-inflammatory drugs (NSAIDs) and supplements.  This website has more information on Pradaxa (dabigatran): [email or web address]

## 2021-02-07 NOTE — Progress Notes (Signed)
PROGRESS NOTE                                                                                                                                                                                                             Patient Demographics:    Dorothy Barker, is a 80 y.o. female, DOB - May 17, 1941, TWS:[id number]  Outpatient Primary MD for the patient is Pcp, No    LOS - 1  Admit date - 02/06/2021    Chief Complaint  Patient presents with   Fall       Brief Narrative (HPI from H&P) - 80 year old female with past medical history of hyperlipidemia, hypertension, insulin-dependent diabetes mellitus type 2, hypothyroidism, atrial flutter (on Eliquis), recent cardioembolic stroke diagnosed 08/2020 secondary to atrial flutter and peripheral vascular disease who presents to [facility] emergency department via EMS from her assisted living facility status post fall, this is her first fall at ALF and she also hit her head when she fell.  In the ER head CT nonacute MRI shows large acute left cerebellar infarct.  She was admitted for further treatment.   Subjective:    Dorothy Barker today has, No headache, No chest pain, No abdominal pain - No Nausea, No new weakness tingling or numbness, no SOB.   Assessment  & Plan :     Acute left cerebellar infarct likely causing fall.  In a patient who had a previous CVA few months ago due to a flutter.  Was on Eliquis and aspirin combination.  Stroke team following, full stroke work-up per stroke team, CTA noted.  Case discussed with neurologist Dr. Leonie Man.  She will be switched to Pradaxa only for now, case management to look into insurance coverage.  For now stop Eliquis and aspirin per neurology.  Continue statin for secondary prevention.   2.  A flutter.  Chronic with [place] vas 2 score of greater than 5.  Anticoagulation as above, resume Coreg at low-dose 48 hours after CVA.  3.  Dyslipidemia.   Continue statin.  LDL at goal.  4. HTN -   For now permissive hypertension.  5.  Incidental COVID infection.  Fully vaccinated.  No pulmonary symptoms.  CRP is stable will monitor.  Hypoxic Resp. Failure due to Acute Covid 19 Viral Pneumonitis during the ongoing 2020 Covid 19 Pandemic -  Encouraged the patient to sit up in chair in the daytime use I-S and flutter valve for pulmonary toiletry.  Will advance activity and titrate down oxygen as possible.   6.  Hypomagnesemia.  Replace.    7. DM type II.  On Lantus and sliding scale will monitor and adjust dose as needed  CBG (last 3)  Recent Labs    02/07/21 0356 02/07/21 0927  GLUCAP 211* 182*     SpO2: 100 %  Recent Labs  Lab 02/06/21 1919 02/06/21 1930 02/06/21 1941 02/06/21 1955 02/07/21 0831  WBC 9.6  --   --   --  6.9  HGB 11.9*  --  12.2  --  11.2*  HCT 36.9  --  36.0  --  33.9*  PLT 208  --   --   --  189  CRP  --   --   --   --  0.8  BNP  --   --   --   --  70.2  PROCALCITON  --   --   --   --  <0.10  AST 21  --   --   --   --   ALT 20  --   --   --   --   ALKPHOS 71  --   --   --   --   BILITOT 0.6  --   --   --   --   ALBUMIN 3.1*  --   --   --   --   INR 1.3*  --   --   --   --   LATICACIDVEN  --  1.9  --   --   --   SARSCOV2NAA  --   --   --  POSITIVE*  --     No results found for: HGBA1C  Lab Results  Component Value Date   CHOL 122 02/07/2021   HDL 45 02/07/2021   LDLCALC 57 02/07/2021   TRIG 98 02/07/2021   CHOLHDL 2.7 02/07/2021     Obesity: There is no height or weight on file to calculate BMI.          Condition - Fair  Family Communication  :  Leamon Arnt [phone] on 02/07/21  Code Status :  Full  Consults  :  Neuro  PUD Prophylaxis : PPI   Procedures  :           Disposition Plan  :    Status is: Inpatient  Remains inpatient appropriate because:IV treatments appropriate due to intensity of illness or inability to take PO  Dispo: The patient is from:  ALF              Anticipated d/c is to: ALF              Patient currently is not medically stable to d/c.   Difficult to place patient No   DVT Prophylaxis  :    SCD's Start: 02/06/21 2333   Eliquis  Lab Results  Component Value Date   PLT 189 02/07/2021    Diet :  Diet Order             Diet heart healthy/carb modified Room service appropriate? Yes; Fluid consistency: Thin  Diet effective now                    Inpatient Medications  Scheduled Meds:   stroke: mapping our early stages of recovery book  Does not apply Once   vitamin C  500 mg Oral Daily   atorvastatin  80 mg Oral QHS   insulin aspart  0-15 Units Subcutaneous TID AC & HS   insulin glargine  26 Units Subcutaneous QHS   pantoprazole  40 mg Oral Daily   thyroid  60 mg Oral QAC breakfast   zinc sulfate  220 mg Oral Daily   Continuous Infusions:  magnesium sulfate bolus IVPB     [START ON 02/08/2021] remdesivir 100 mg in NS 100 mL     PRN Meds:.acetaminophen **OR** acetaminophen (TYLENOL) oral liquid 160 mg/5 mL **OR** acetaminophen, albuterol, guaiFENesin-dextromethorphan, ondansetron (ZOFRAN) IV, polyethylene glycol  Antibiotics  :    Anti-infectives (From admission, onward)    Start     Dose/Rate Route Frequency Ordered Stop   02/08/21 1000  remdesivir 100 mg in sodium chloride 0.9 % 100 mL IVPB       See Hyperspace for full Linked Orders Report.   100 mg 200 mL/hr over 30 Minutes Intravenous Daily 02/07/21 0345 02/10/21 0959   02/07/21 0500  remdesivir 200 mg in sodium chloride 0.9% 250 mL IVPB       See Hyperspace for full Linked Orders Report.   200 mg 580 mL/hr over 30 Minutes Intravenous Once 02/07/21 0345 02/07/21 0615        Time Spent in minutes  30   Lala Lund M.D on 02/07/2021 at 12:02 PM  To page go to [email or web address]   [facility] -  Office  [phone]  See all Orders from today for further details    Objective:   Vitals:   02/07/21 0801 02/07/21 1000  02/07/21 1100 02/07/21 1142  BP:  (!) 147/69 (!) 144/62   Pulse:  80 78   Resp:  16 15   Temp: 98.7 F (37.1 C)   99 F (37.2 C)  TempSrc: Oral   Oral  SpO2:  99% 100%     Wt Readings from Last 3 Encounters:  No data found for Wt     Intake/Output Summary (Last 24 hours) at 02/07/2021 1202 Last data filed at 02/07/2021 0615 Gross per 24 hour  Intake 217.32 ml  Output --  Net 217.32 ml     Physical Exam  Awake Alert, No new F.N deficits, small bruise on her forehead [place].AT,PERRAL Supple Neck,No JVD, No cervical lymphadenopathy appriciated.  Symmetrical Chest wall movement, Good air movement bilaterally, CTAB RRR,No Gallops,Rubs or new Murmurs, No Parasternal Heave +ve B.Sounds, Abd Soft, No tenderness, No organomegaly appriciated, No rebound - guarding or rigidity. No Cyanosis, Clubbing or edema   RN pressure injury documentation:     Data Review:    CBC Recent Labs  Lab 02/06/21 1919 02/06/21 1941 02/07/21 0831  WBC 9.6  --  6.9  HGB 11.9* 12.2 11.2*  HCT 36.9 36.0 33.9*  PLT 208  --  189  MCV 90.2  --  90.6  MCH 29.1  --  29.9  MCHC 32.2  --  33.0  RDW 12.2  --  12.3    Recent Labs  Lab 02/06/21 1919 02/06/21 1930 02/06/21 1941 02/07/21 0831  NA 133*  --  135 136  K 3.4*  --  3.6 4.0  CL 96*  --  98 102  CO2 26  --   --  29  GLUCOSE 275*  --  277* 194*  BUN 31*  --  32* 25*  CREATININE 1.43*  --  1.30* 1.28*  CALCIUM 9.9  --   --  9.6  AST 21  --   --   --   ALT 20  --   --   --   ALKPHOS 71  --   --   --   BILITOT 0.6  --   --   --   ALBUMIN 3.1*  --   --   --   MG  --   --   --  1.4*  CRP  --   --   --  0.8  PROCALCITON  --   --   --  <0.10  LATICACIDVEN  --  1.9  --   --   INR 1.3*  --   --   --   BNP  --   --   --  70.2    ------------------------------------------------------------------------------------------------------------------ Recent Labs    02/07/21 0831  CHOL 122  HDL 45  LDLCALC 57  TRIG 98  CHOLHDL 2.7    No  results found for: HGBA1C ------------------------------------------------------------------------------------------------------------------ No results for input(s): TSH, T4TOTAL, T3FREE, THYROIDAB in the last 72 hours.  Invalid input(s): FREET3  Cardiac Enzymes No results for input(s): CKMB, TROPONINI, MYOGLOBIN in the last 168 hours.  Invalid input(s): CK ------------------------------------------------------------------------------------------------------------------    Component Value Date/Time   BNP 70.2 02/07/2021 0831    Micro Results Recent Results (from the past 240 hour(s))  Resp Panel by RT-PCR (Flu A&B, Covid) Nasopharyngeal Swab     Status: Abnormal   Collection Time: 02/06/21  7:55 PM   Specimen: Nasopharyngeal Swab; Nasopharyngeal(NP) swabs in vial transport medium  Result Value Ref Range Status   SARS Coronavirus 2 by RT PCR POSITIVE (A) NEGATIVE Final    Comment: RESULT CALLED TO, READ BACK BY AND VERIFIED WITH: RN JESSICA FEROLLOYD BR MESSAN H. AT [phone] [id number] [phone] (NOTE) SARS-CoV-2 target nucleic acids are DETECTED.  The SARS-CoV-2 RNA is generally detectable in upper respiratory specimens during the acute phase of infection. Positive results are indicative of the presence of the identified virus, but do not rule out bacterial infection or co-infection with other pathogens not detected by the test. Clinical correlation with patient history and other diagnostic information is necessary to determine patient infection status. The expected result is Negative.  Fact Sheet for Patients: EntrepreneurPulse.com.au  Fact Sheet for Healthcare Providers: IncredibleEmployment.be  This test is not yet approved or cleared by the [place] FDA and  has been authorized for detection and/or diagnosis of SARS-CoV-2 by FDA under an Emergency Use Authorization (EUA).  This EUA will remain in effect (mea ning this test can be used) for  the duration of  the COVID-19 declaration under Section 564(b)(1) of the Act, 21 U.S.C. section 360bbb-3(b)(1), unless the authorization is terminated or revoked sooner.     Influenza A by PCR NEGATIVE NEGATIVE Final   Influenza B by PCR NEGATIVE NEGATIVE Final    Comment: (NOTE) The Xpert Xpress SARS-CoV-2/FLU/RSV plus assay is intended as an aid in the diagnosis of influenza from Nasopharyngeal swab specimens and should not be used as a sole basis for treatment. Nasal washings and aspirates are unacceptable for Xpert Xpress SARS-CoV-2/FLU/RSV testing.  Fact Sheet for Patients: EntrepreneurPulse.com.au  Fact Sheet for Healthcare Providers: IncredibleEmployment.be  This test is not yet approved or cleared by the [place] FDA and has been authorized for detection and/or diagnosis of SARS-CoV-2 by FDA under an Emergency Use Authorization (EUA). This EUA will remain in effect (meaning this test can be used) for the duration of the COVID-19 declaration under Section  564(b)(1) of the Act, 21 U.S.C. section 360bbb-3(b)(1), unless the authorization is terminated or revoked.  Performed at [facility], [place] [address], [place], [place] [address]     Radiology Reports CT [facility] HEAD NECK W WO CM  Result Date: 02/07/2021 CLINICAL DATA:  Neuro deficit, acute stroke suspected. EXAM: CT ANGIOGRAPHY HEAD AND NECK TECHNIQUE: Multidetector CT imaging of the head and neck was performed using the standard protocol during bolus administration of intravenous contrast. Multiplanar CT image reconstructions and MIPs were obtained to evaluate the vascular anatomy. Carotid stenosis measurements (when applicable) are obtained utilizing NASCET criteria, using the distal internal carotid diameter as the denominator. CONTRAST:  81mL OMNIPAQUE IOHEXOL 350 MG/ML SOLN COMPARISON:  Same day MRI.  CT head from yesterday. FINDINGS: CT HEAD FINDINGS Brain: No significant  change in edema associated with inferior left cerebellar acute infarct, better characterized on same day MRI. No evidence of new/interval large vascular territory infarct. No mass occupying acute hemorrhage. Remote right basal ganglia infarct. Similar atrophy and chronic microvascular ischemic disease. No midline shift. Basal cisterns are patent. No hydrocephalus. Vascular: See below. Skull: No acute fracture. Sinuses: Left maxillary sinus and left sphenoid sinus retention cyst. Ethmoid air cell and right maxillary sinus mucosal thickening. Orbits: No acute abnormality. Review of the MIP images confirms the above findings CTA NECK FINDINGS Aortic arch: The brachiocephalic, left subclavian, and left common carotid artery origins are not imaged. Visualized great vessels are patent. Right carotid system: No evidence of dissection, stenosis (50% or greater) or occlusion. Left carotid system: Common carotid origin is not imaged. Makes calcific and noncalcific atherosclerosis at the carotid bifurcation without greater than 50% stenosis. Vertebral arteries: Mildly left dominant. No evidence of dissection, stenosis (50% or greater) or occlusion. Skeleton: Multilevel degenerative change of the cervical spine with bulky anterior osteophytes and facet mediated mild anterolisthesis of C4 on C5. Other neck: No acute abnormality. Upper chest: Visualized lung apices are clear. Review of the MIP images confirms the above findings CTA HEAD FINDINGS Anterior circulation: Bilateral cavernous/paraclinoid calcific atherosclerosis without high-grade stenosis of the intracranial ICAs. Moderate to severe stenosis of the mid to distal right M1 MCA and the mid left M1 MCA. Multifocal atherosclerotic narrowing of bilateral M2 MCAs. Bilateral ACAs are patent. Posterior circulation: Severe stenosis versus occlusion of thea left AICA (series 12, images 124/125) with distal opacification. Right PICA is patent. Left PICA is not visualized.  Bilateral intradural vertebral arteries are patent without greater than 50% stenosis. The basilar artery and bilateral posterior cerebral arteries are patent. Multifocal severe stenosis of the left P2 PCA. Severe distal right P1 PCA stenosis. Venous sinuses: As permitted by contrast timing, patent. Review of the MIP images confirms the above findings IMPRESSION: CT head: No significant change in edema associated with inferior left cerebellar acute infarct, better characterized on same day MRI. CTA Head: 1. Severe stenosis versus occlusion of the proximal left AICA with distal opacification. A left PICA is not visualized which could represent AICA dominance vs proximal PICA stenosis/occlusion. 2. Severe distal right P1 PCA and severe multifocal left P2 PCA stenosis. 3. Moderate to severe stenosis of the bilateral M1 MCAs. CTA Neck: 1. Left greater than right carotid bifurcation atherosclerosis without greater than 50% stenosis. 2. Great vessel origins are not imaged. These results will be called to the ordering clinician or representative by the Radiologist Assistant, and communication documented in the PACS or [employer]. Electronically Signed   By: Margaretha Sheffield MD   On:  02/07/2021 10:43   CT Head Wo Contrast  Result Date: 02/06/2021 CLINICAL DATA:  Fall EXAM: CT HEAD WITHOUT CONTRAST CT CERVICAL SPINE WITHOUT CONTRAST TECHNIQUE: Multidetector CT imaging of the head and cervical spine was performed following the standard protocol without intravenous contrast. Multiplanar CT image reconstructions of the cervical spine were also generated. COMPARISON:  None. FINDINGS: CT HEAD FINDINGS Brain: Area of edema in the left cerebellum measures approximately 4.0 x 3.5 cm. There is an old infarct of the right basal ganglia. There is periventricular hypoattenuation compatible with chronic microvascular disease. There is generalized atrophy without lobar predilection. No acute hemorrhage Vascular: No abnormal  hyperdensity of the major intracranial arteries or dural venous sinuses. No intracranial atherosclerosis. Skull: The visualized skull base, calvarium and extracranial soft tissues are normal. Sinuses/Orbits: No fluid levels or advanced mucosal thickening of the visualized paranasal sinuses. No mastoid or middle ear effusion. The orbits are normal. CT CERVICAL SPINE FINDINGS Alignment: No static subluxation. Facets are aligned. Occipital condyles are normally positioned. Skull base and vertebrae: No acute fracture. Soft tissues and spinal canal: No prevertebral fluid or swelling. No visible canal hematoma. Disc levels: Large central disc protrusion at C4-5 indents the ventral spinal cord. Small central disc protrusions also at C2-3 and C3-4 narrows the ventral thecal sac. Upper chest: No pneumothorax, pulmonary nodule or pleural effusion. Other: Normal visualized paraspinal cervical soft tissues. IMPRESSION: 1. Area of edema in the left cerebellum without associated mass effect or acute hemorrhage, most likely a subacute infarct. MRI of the brain with and without contrast is recommended for further characterization and to exclude neoplasm. 2. No acute fracture or static subluxation of the cervical spine. 3. 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 Electronically Signed   By: Ulyses Jarred M.D.   On: 02/06/2021 20:01   CT Cervical Spine Wo Contrast  Result Date: 02/06/2021 CLINICAL DATA:  Fall EXAM: CT HEAD WITHOUT CONTRAST CT CERVICAL SPINE WITHOUT CONTRAST TECHNIQUE: Multidetector CT imaging of the head and cervical spine was performed following the standard protocol without intravenous contrast. Multiplanar CT image reconstructions of the cervical spine were also generated. COMPARISON:  None. FINDINGS: CT HEAD FINDINGS Brain: Area of edema in the left cerebellum measures approximately 4.0 x 3.5 cm. There is an old infarct of the right basal ganglia. There is periventricular  hypoattenuation compatible with chronic microvascular disease. There is generalized atrophy without lobar predilection. No acute hemorrhage Vascular: No abnormal hyperdensity of the major intracranial arteries or dural venous sinuses. No intracranial atherosclerosis. Skull: The visualized skull base, calvarium and extracranial soft tissues are normal. Sinuses/Orbits: No fluid levels or advanced mucosal thickening of the visualized paranasal sinuses. No mastoid or middle ear effusion. The orbits are normal. CT CERVICAL SPINE FINDINGS Alignment: No static subluxation. Facets are aligned. Occipital condyles are normally positioned. Skull base and vertebrae: No acute fracture. Soft tissues and spinal canal: No prevertebral fluid or swelling. No visible canal hematoma. Disc levels: Large central disc protrusion at C4-5 indents the ventral spinal cord. Small central disc protrusions also at C2-3 and C3-4 narrows the ventral thecal sac. Upper chest: No pneumothorax, pulmonary nodule or pleural effusion. Other: Normal visualized paraspinal cervical soft tissues. IMPRESSION: 1. Area of edema in the left cerebellum without associated mass effect or acute hemorrhage, most likely a subacute infarct. MRI of the brain with and without contrast is recommended for further characterization and to exclude neoplasm. 2. No acute fracture or static subluxation of the cervical spine. 3.  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 Electronically Signed   By: Ulyses Jarred M.D.   On: 02/06/2021 20:01   MR BRAIN WO CONTRAST  Result Date: 02/07/2021 CLINICAL DATA:  Stroke follow-up EXAM: MRI HEAD WITHOUT CONTRAST TECHNIQUE: Multiplanar, multiecho pulse sequences of the brain and surrounding structures were obtained without intravenous contrast. COMPARISON:  01/04/2021 FINDINGS: Brain: Large acute infarct of the left cerebellum. Old right basal ganglia infarct. No acute or chronic hemorrhage. There is multifocal  hyperintense T2-weighted signal within the white matter. Generalized volume loss without a clear lobar predilection. The midline structures are normal. Vascular: Major flow voids are preserved. Skull and upper cervical spine: Normal calvarium and skull base. Visualized upper cervical spine and soft tissues are normal. Sinuses/Orbits:Left maxillary retention cyst no mastoid or middle ear effusion. Normal orbits. IMPRESSION: 1. Large acute left cerebellar infarct. No hemorrhage or mass effect. 2. Old right basal ganglia infarct and findings of chronic microvascular ischemia. Electronically Signed   By: Ulyses Jarred M.D.   On: 02/07/2021 02:18   DG Pelvis Portable  Result Date: 02/06/2021 CLINICAL DATA:  Ground level fall EXAM: PORTABLE PELVIS 1-2 VIEWS COMPARISON:  None. FINDINGS: There is no evidence of pelvic fracture or diastasis. Degenerative changes of the bilateral hips, right worse than left. No pelvic bone lesions are seen. Advanced vascular calcifications are present. IMPRESSION: Negative. Electronically Signed   By: Davina Poke D.O.   On: 02/06/2021 20:29   DG Chest Port 1 View  Result Date: 02/06/2021 CLINICAL DATA:  Ground level fall EXAM: PORTABLE CHEST 1 VIEW COMPARISON:  None. FINDINGS: Heart size within normal limits. Low lung volumes. Blunting of the left costophrenic angle may reflect small pleural effusion versus prominent epicardial fat pad. 11 mm nodular density projects within the left upper lobe. Right lung is clear. No pneumothorax. No acute osseous abnormality. IMPRESSION: 1. Blunting of the left costophrenic angle may reflect small pleural effusion versus prominent epicardial fat pad. 2. 11 mm nodular density projects within the left upper lobe. Follow-up chest CT is recommended for further evaluation if recent outside imaging of the chest is unavailable. Electronically Signed   By: Davina Poke D.O.   On: 02/06/2021 20:26

## 2021-02-07 NOTE — ED Notes (Signed)
Breakfast orders placed 

## 2021-02-07 NOTE — Progress Notes (Signed)
ANTICOAGULATION CONSULT NOTE - Initial Consult  Pharmacy Consult for Pradaxa Indication: atrial fibrillation  Allergies  Allergen Reactions   Amlodipine Other (See Comments)    Unknown reaction   Codeine Other (See Comments)    Unknown reaction   Lotensin [Benazepril] Other (See Comments)    Unknown reaction   Lotrel [Amlodipine Besy-Benazepril Hcl] Other (See Comments)    Unknown reaction   Naproxen Other (See Comments)    Unknown reaction   Tramadol Other (See Comments)    Unknown reaction    Patient Measurements: Height: 5\' 1"  (154.9 cm) Weight: 80 kg (176 lb 5.9 oz) IBW/kg (Calculated) : 47.8   Vital Signs: Temp: 97.9 F (36.6 C) (06/20 1207) Temp Source: Oral (06/20 1207) BP: 143/62 (06/20 1207) Pulse Rate: 78 (06/20 1100)  Labs: Recent Labs    02/06/21 1919 02/06/21 1941 02/07/21 0831  HGB 11.9* 12.2 11.2*  HCT 36.9 36.0 33.9*  PLT 208  --  189  LABPROT 16.4*  --   --   INR 1.3*  --   --   CREATININE 1.43* 1.30* 1.28*    Estimated Creatinine Clearance: 33 mL/min (A) (by C-G formula based on SCr of 1.28 mg/dL (H)).   Medical History: Past Medical History:  Diagnosis Date   Chronic kidney disease, stage 3b ([place]) 02/06/2021   Essential hypertension 02/06/2021   Hypothyroidism 02/06/2021   Paroxysmal atrial flutter ([place]) 02/06/2021   Type 2 diabetes mellitus with stage 3b chronic kidney disease, with long-term current use of insulin ([place]) 02/06/2021    Medications:  Medications Prior to Admission  Medication Sig Dispense Refill Last Dose   acetaminophen (TYLENOL) 500 MG tablet Take 1,000 mg by mouth every 8 (eight) hours as needed for fever (pain).   unknown   apixaban (ELIQUIS) 5 MG TABS tablet Take 5 mg by mouth 2 (two) times daily.   02/06/2021 at 8am   aspirin 81 MG chewable tablet Chew 81 mg by mouth every morning.   02/06/2021 at am   atorvastatin (LIPITOR) 40 MG tablet Take 40 mg by mouth at bedtime.   02/05/2021 at pm   carvedilol (COREG) 6.25 MG  tablet Take 6.25 mg by mouth 2 (two) times daily with a meal.   02/06/2021 at 8am   Dulaglutide (TRULICITY) 1.5 JH/4.1DE SOPN Inject 1.5 mg into the skin every Thursday.   02/03/2021   hydrALAZINE (APRESOLINE) 10 MG tablet Take 10 mg by mouth 4 (four) times daily. 8am, 12p, 4p, 8p (hold for SBP <100)   02/06/2021 at noon   insulin glargine (LANTUS) 100 unit/mL SOPN Inject 26 Units into the skin at bedtime.   02/05/2021 at pm   lisinopril-hydrochlorothiazide (ZESTORETIC) 20-25 MG tablet Take 1 tablet by mouth every morning.   02/06/2021 at am   thyroid (ARMOUR) 60 MG tablet Take 60 mg by mouth every morning.   02/06/2021 at am   Vitamin D, Ergocalciferol, (DRISDOL) 1.25 MG (50000 UNIT) CAPS capsule Take 50,000 Units by mouth every Thursday.   02/03/2021    Assessment: 80 y.o female with h/o atrial fibrillation.   Here with acute CVA while on Eliquis prior to admission,   reported taking BID as prescribed at assisted living facility.  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  No acute or chronic hemorrhage. 6/20 @09 :20 CT head : no significant change in edema associated with inferior left cerebellar acute infarct. No mass occupying acute  hemorrhage.  Pharmacy consulted to switch to Pradaxa as recommended by neurologist.  hgb 12.2> 11.2, pltc 208>189k.  SCr 1.30 > 1.28, Crcl 33 ml/min. Will start Pradaxa at 150 mg BID since CrCl is >30 ml/min.    Pradaxa her copay is $64.00 due to her being in the Coverage gap (donut hole) Monitor for s/sx of bleeding   Goal of Therapy:  stroke prevention   Plan:  Pradaxa 150 mg po BID Monitor renal function and adjust dose if CrCl falls to 15-30 ml/min.  Monitor for signs and symptoms of bleeding  Pradaxa  copay is $64.00   Nicole Cella, [place] Clinical Pharmacist [phone] Please check AMION for all [place] phone numbers After 10:00 PM, call [place] [phone]  02/07/2021,2:51 PM

## 2021-02-07 NOTE — ED Notes (Signed)
Patient transported to CT 

## 2021-02-08 DIAGNOSIS — L8990 Pressure ulcer of unspecified site, unspecified stage: Secondary | ICD-10-CM | POA: Insufficient documentation

## 2021-02-08 DIAGNOSIS — I639 Cerebral infarction, unspecified: Secondary | ICD-10-CM | POA: Diagnosis not present

## 2021-02-08 LAB — COMPREHENSIVE METABOLIC PANEL
ALT: 21 U/L (ref 0–44)
AST: 23 U/L (ref 15–41)
Albumin: 2.7 g/dL — ABNORMAL LOW (ref 3.5–5.0)
Alkaline Phosphatase: 49 U/L (ref 38–126)
Anion gap: 14 (ref 5–15)
BUN: 30 mg/dL — ABNORMAL HIGH (ref 8–23)
CO2: 25 mmol/L (ref 22–32)
Calcium: 9.9 mg/dL (ref 8.9–10.3)
Chloride: 98 mmol/L (ref 98–111)
Creatinine, Ser: 1.36 mg/dL — ABNORMAL HIGH (ref 0.44–1.00)
GFR, Estimated: 39 mL/min — ABNORMAL LOW (ref >60)
Glucose, Bld: 95 mg/dL (ref 70–99)
Potassium: 3.8 mmol/L (ref 3.5–5.1)
Sodium: 137 mmol/L (ref 135–145)
Total Bilirubin: 0.6 mg/dL (ref 0.3–1.2)
Total Protein: 6.1 g/dL — ABNORMAL LOW (ref 6.5–8.1)

## 2021-02-08 LAB — CBC WITH DIFFERENTIAL/PLATELET
Abs Immature Granulocytes: 0.02 10*3/uL (ref 0.00–0.07)
Basophils Absolute: 0 10*3/uL (ref 0.0–0.1)
Basophils Relative: 1 %
Eosinophils Absolute: 0.2 10*3/uL (ref 0.0–0.5)
Eosinophils Relative: 3 %
HCT: 35 % — ABNORMAL LOW (ref 36.0–46.0)
Hemoglobin: 11.6 g/dL — ABNORMAL LOW (ref 12.0–15.0)
Immature Granulocytes: 0 %
Lymphocytes Relative: 31 %
Lymphs Abs: 2.1 10*3/uL (ref 0.7–4.0)
MCH: 29.7 pg (ref 26.0–34.0)
MCHC: 33.1 g/dL (ref 30.0–36.0)
MCV: 89.5 fL (ref 80.0–100.0)
Monocytes Absolute: 0.8 10*3/uL (ref 0.1–1.0)
Monocytes Relative: 12 %
Neutro Abs: 3.6 10*3/uL (ref 1.7–7.7)
Neutrophils Relative %: 53 %
Platelets: 206 10*3/uL (ref 150–400)
RBC: 3.91 MIL/uL (ref 3.87–5.11)
RDW: 12.4 % (ref 11.5–15.5)
WBC: 6.7 10*3/uL (ref 4.0–10.5)
nRBC: 0 % (ref 0.0–0.2)

## 2021-02-08 LAB — MAGNESIUM: Magnesium: 1.7 mg/dL (ref 1.7–2.4)

## 2021-02-08 LAB — GLUCOSE, CAPILLARY
Glucose-Capillary: 113 mg/dL — ABNORMAL HIGH (ref 70–99)
Glucose-Capillary: 131 mg/dL — ABNORMAL HIGH (ref 70–99)

## 2021-02-08 LAB — PROCALCITONIN: Procalcitonin: <0.1 ng/mL

## 2021-02-08 LAB — C-REACTIVE PROTEIN: CRP: 1.5 mg/dL — ABNORMAL HIGH (ref <1.0)

## 2021-02-08 LAB — BRAIN NATRIURETIC PEPTIDE: B Natriuretic Peptide: 50.2 pg/mL (ref 0.0–100.0)

## 2021-02-08 MED ORDER — PANTOPRAZOLE SODIUM 40 MG PO TBEC: 40.0000 mg | DELAYED_RELEASE_TABLET | Freq: Every day | ORAL | 0 refills | Status: DC

## 2021-02-08 MED ORDER — DABIGATRAN ETEXILATE MESYLATE 150 MG PO CAPS: 150.0000 mg | ORAL_CAPSULE | Freq: Two times a day (BID) | ORAL | 0 refills | Status: DC

## 2021-02-08 MED ORDER — METOPROLOL TARTRATE 50 MG PO TABS
50.0000 mg | ORAL_TABLET | Freq: Two times a day (BID) | ORAL | Status: DC
  Administered 2021-02-08: 50 mg via ORAL
  Filled 2021-02-08: qty 1

## 2021-02-08 MED ORDER — METOPROLOL TARTRATE 50 MG PO TABS: 50.0000 mg | ORAL_TABLET | Freq: Two times a day (BID) | ORAL | 0 refills | Status: DC

## 2021-02-08 NOTE — Consult Note (Signed)
WOC consulted for wound on right foot.  WOC saw this patient yesterday for wound on the left foot.  Visited with patient briefly to verify with patient. NO wound on the right foot.   Re consult if needed, will not follow at this time. Thanks  Dominyk Law [employer], RN,CWOCN, CNS, [place] [phone])

## 2021-02-08 NOTE — Progress Notes (Signed)
Kipp Brood to be D/C'd  [employer]  per MD order.  Discussed with the patient and all questions fully answered.  VSS, Skin clean, dry and intact without evidence of skin break down, no evidence of skin tears noted. IV catheter discontinued intact. Site without signs and symptoms of complications. Dressing and pressure applied.  An After Visit Summary was printed and given to the patient. Patient received prescription.  D/c education completed with daughter including follow up instructions, medication list, d/c activities limitations if indicated, with other d/c instructions as indicated by MD - patient able to verbalize understanding, all questions fully answered.   Patient instructed to return to ED, call 911, or call MD for any changes in condition.   Patient escorted via [place], and D/C home via private auto.  Jeanella Craze 02/08/2021 1:38 PM

## 2021-02-08 NOTE — TOC Progression Note (Signed)
Transition of Care [facility]) - Progression Note    Patient Details  Name: OREE MIRELEZ MRN: [id number] Date of Birth: 1940/12/28  Transition of Care [facility]) CM/SW [place], LCSW Phone Number: 02/08/2021, 12:38 PM  Clinical Narrative:    CSW spoke with patient's daughter. She reported that she can come pick patient up and take her to ALF once DC is finalized. CSW provided info on Pradaxa coupon. Scripts will go with patient. CSW faxed over [place] and DC summary. Alda Berthold will call CSW back with approval.    Expected Discharge Plan: Assisted Living Barriers to Discharge: Continued Medical Work up  Expected Discharge Plan and Services Expected Discharge Plan: Assisted Living In-house Referral: Clinical Social Work   Post Acute Care Choice: Resumption of Svcs/PTA Provider Living arrangements for the past 2 months: [place] Expected Discharge Date: 02/08/21                         HH Arranged: RN, PT, OT HH Agency: [place] Date [facility] Agency Contacted: 02/08/21 Time [place]: [id number] Representative spoke with at [place]: [place] Determinants of Health (SDOH) Interventions    Readmission Risk Interventions No flowsheet data found.

## 2021-02-08 NOTE — Consult Note (Signed)
[place] team performed a consult for left foot yesterday; refer to previous progress notes.  Requested to assess right heel today.  [place] team is currently working on another campus; please post photo in the EMR if possible and we can perform the consult remotely.  Secure chat message sent to primary team. Thank-you Julien Girt MSN, RN, [place], [place], [place]

## 2021-02-08 NOTE — Progress Notes (Signed)
Report called and given to Alda Berthold of [facility].

## 2021-02-08 NOTE — TOC CAGE-AID Note (Signed)
Transition of Care [facility]) - CAGE-AID Screening   Patient Details  Name: CAYLEI SPERRY MRN: [id number] Date of Birth: 05-Jun-1941  Transition [facility] Care [facility]) CM/SW Contact:    Benard Halsted, LCSW Phone Number: 02/08/2021, 12:01 PM   Clinical Narrative: Patient declined any alcohol or substance use.   CAGE-AID Screening:    Have You Ever Felt You Ought to Cut Down on Your Drinking or Drug Use?: No Have People Annoyed You By Critizing Your Drinking Or Drug Use?: No Have You Felt Bad Or Guilty About Your Drinking Or Drug Use?: No Have You Ever Had a Drink or Used Drugs First Thing In The Morning to Steady Your Nerves or to Get Rid of a Hangover?: No CAGE-AID Score: 0  Substance Abuse Education Offered: No

## 2021-02-08 NOTE — TOC Initial Note (Signed)
Transition of Care [facility]) - Initial/Assessment Note    Patient Details  Name: Dorothy Barker MRN: [id number] Date of Birth: 1940/10/15  [facility]) CM/SW Contact:    Benard Halsted, LCSW Phone Number: 02/08/2021, 9:07 AM  Clinical Narrative:                 CSW confirmed that patient resides at [facility]. CSW spoke with caregiver, Alda Berthold. She is aware of patient's COVID positive status. She requests an FL2 and DC Summary once patient is discharged to be faxed to f. [phone]. Patient is already active with [facility]; will place info on FL2.   Expected Discharge Plan: Assisted Living Barriers to Discharge: Continued Medical Work up   Patient Goals and CMS Choice Patient states their goals for this hospitalization and ongoing recovery are:: Return to ALF CMS Medicare.gov Compare Post Acute Care list provided to:: Patient Choice offered to / list presented to : Patient  Expected Discharge Plan and Services Expected Discharge Plan: Assisted Living In-house Referral: Clinical Social Work   Post Acute Care Choice: Resumption of Svcs/PTA Provider Living arrangements for the past 2 months: [place] Arranged: RN, PT, OT [facility] Agency: [place] Date [place]: 02/08/21 Time [place]: [phone] Representative spoke with at [place]: Georgina Snell  Prior Living Arrangements/Services Living arrangements for the past 2 months: [place] Lives with:: Facility Resident Patient language and need for interpreter reviewed:: Yes Do you feel safe going back to the place where you live?: Yes      Need for Family Participation in Patient Care: No (Comment) Care giver support system in place?: Yes (comment) Current home services: Home RN Criminal Activity/Legal Involvement Pertinent to Current Situation/Hospitalization: No - Comment as needed  Activities of Daily  Living Home Assistive Devices/Equipment: Cane (specify quad or straight), Wheelchair, Environmental consultant (specify type) ADL Screening (condition at time of admission) Patient's cognitive ability adequate to safely complete daily activities?: No Is the patient deaf or have difficulty hearing?: No Does the patient have difficulty seeing, even when wearing glasses/contacts?: No Does the patient have difficulty concentrating, remembering, or making decisions?: No Patient able to express need for assistance with ADLs?: Yes Does the patient have difficulty dressing or bathing?: No Independently performs ADLs?: Yes (appropriate for developmental age) Does the patient have difficulty walking or climbing stairs?: Yes Weakness of Legs: Both Weakness of Arms/Hands: Right  Permission Sought/Granted Permission sought to share information with : Facility Art therapist granted to share information with : Yes, Verbal Permission Granted     Permission granted to share info w AGENCY: [facility]        Emotional Assessment   Attitude/Demeanor/Rapport: Engaged Affect (typically observed): Accepting, Appropriate Orientation: : Oriented to Self, Oriented to Place, Oriented to  Time, Oriented to Situation Alcohol / Substance Use: Not Applicable Psych Involvement: No (comment)  Admission diagnosis:  Fall [W19.XXXA] Stroke [facility]) [I63.9] ZCHYI-50 virus infection [U07.1] Patient Active Problem List   Diagnosis Date Noted   Pressure injury of skin 02/08/2021   Stroke ([place]) 02/08/2021   COVID-19 virus infection 02/07/2021   Left upper lobe pulmonary nodule 02/07/2021   Acute ischemic stroke ([place]) 02/06/2021   Fall at home, initial encounter 02/06/2021   Essential hypertension 02/06/2021   Hypothyroidism 02/06/2021  Paroxysmal atrial flutter ([place]) 02/06/2021   Type 2 diabetes mellitus with stage 3b chronic kidney disease, with long-term current use of insulin ([place]) 02/06/2021   Hypokalemia  02/06/2021   Chronic kidney disease, stage 3b ([place]) 02/06/2021   PCP:  Pcp, No Pharmacy:  No Pharmacies Listed    Social Determinants of Health (SDOH) Interventions    Readmission Risk Interventions No flowsheet data found.

## 2021-02-08 NOTE — Discharge Summary (Signed)
Dorothy Barker FXJ:[id number] DOB: 02-19-1941 DOA: 02/06/2021  PCP: Earmon Phoenix, NP  Admit date: 02/06/2021  Discharge date: 02/08/2021  Admitted From: ALF   Disposition:  ALF   Recommendations for Outpatient Follow-up:   Follow up with PCP in 1-2 weeks  PCP Please obtain BMP/CBC, 2 view CXR in 1week,  (see Discharge instructions)   PCP Please follow up on the following pending results: Follow results for A1c, outpatient follow-up with her primary cardiologist and one-time follow-up with vascular surgery.   Home Health: PT-OT, RN Equipment/Devices: has a walker  Consultations: Neuro Discharge Condition: Stable    CODE STATUS: Full   Diet Recommendation: Heart Healthy Low Carb  Diet Order             Diet heart healthy/carb modified Room service appropriate? No; Fluid consistency: Thin  Diet effective now                    Chief Complaint  Patient presents with   Fall     Brief history of present illness from the day of admission and additional interim summary    80 year old female with past medical history of hyperlipidemia, hypertension, insulin-dependent diabetes mellitus type 2, hypothyroidism, atrial flutter (on Eliquis), recent cardioembolic stroke diagnosed 08/2020 secondary to atrial flutter and peripheral vascular disease who presents to [facility] emergency department via EMS from her assisted living facility status post fall, this is her first fall at ALF and she also hit her head when she fell.  In the ER head CT nonacute MRI shows large acute left cerebellar infarct.  She was admitted for further treatment.                                                                  Hospital Course    Acute left cerebellar infarct likely causing fall.  In a patient who had a previous CVA few  months ago due to a flutter.  Was on Eliquis and aspirin combination.  Stroke team following, full stroke work-up per stroke team, CTA noted.  Case discussed with neurologist Dr. Leonie Man.  She will be switched to Pradaxa only for now, continue statin, monitor A1c and glycemic control by ALF physician.  Have some carotid artery disease for which we will have her follow-up with vascular surgery outpatient.  Thankfully no focal deficits at this time.   2.  Chronic A flutter.  Chronic with [place] vas 2 score of greater than 5.  Anticoagulation as above, switched from Coreg to Lopressor.  PCP to monitor heart rate and blood pressure.   3.  Dyslipidemia.  Continue statin.  LDL at goal.   4. HTN -   For now permissive hypertension.   5. Incidental COVID infection.  Fully vaccinated.  No pulmonary symptoms.  CRP is stable , No Rx.    6.  Hypomagnesemia.  Replaced.     7.  Chronic left heel ulcer unstageable.  Present on admission.  Wound care as in wound care instructions.  8. DM type II.  A1c is still pending we will request PCP to monitor for now continue home regimen with q. ACH S Accu-Cheks.   SpO2: 98 %  Recent Labs  Lab 02/06/21 1919 02/06/21 1930 02/06/21 1955 02/07/21 0831 02/08/21 0049  WBC 9.6  --   --  6.9 6.7  CRP  --   --   --  0.8 1.5*  BNP  --   --   --  70.2 50.2  PROCALCITON  --   --   --  <0.10 <0.10  LATICACIDVEN  --  1.9  --   --   --   AST 21  --   --   --  23  ALT 20  --   --   --  21  ALKPHOS 71  --   --   --  49  BILITOT 0.6  --   --   --  0.6  ALBUMIN 3.1*  --   --   --  2.7*  INR 1.3*  --   --   --   --   SARSCOV2NAA  --   --  POSITIVE*  --   --       No results found for: HGBA1C  Lab Results  Component Value Date   CHOL 122 02/07/2021   HDL 45 02/07/2021   LDLCALC 57 02/07/2021   TRIG 98 02/07/2021   CHOLHDL 2.7 02/07/2021      Discharge diagnosis     Principal Problem:   Acute ischemic stroke [facility]) Active Problems:   Fall at home, initial  encounter   Essential hypertension   Hypothyroidism   Paroxysmal atrial flutter (HCC)   Type 2 diabetes mellitus with stage 3b chronic kidney disease, with long-term current use of insulin (HCC)   Hypokalemia   Chronic kidney disease, stage 3b (HCC)   COVID-19 virus infection   Left upper lobe pulmonary nodule   Pressure injury of skin   Stroke [facility])    Discharge instructions    Discharge Instructions     Discharge instructions   Complete by: As directed    Follow with Primary MD in 7 days   Get CBC, CMP, 2 view Chest X ray -  checked next visit within 1 week by Primary MD or SNF MD    Activity: As tolerated with Full fall precautions use walker/cane & assistance as needed  Disposition ALF  Diet: Heart Healthy Low Carb  Accuchecks 4 times/day, Once in AM empty stomach and then before each meal. Log in all results and show them to your Prim.MD in 3 days. If any glucose reading is under 80 or above 300 call your Prim MD immidiately. Follow Low glucose instructions for glucose under 80 as instructed.   Special Instructions: If you have smoked or chewed Tobacco  in the last 2 yrs please stop smoking, stop any regular Alcohol  and or any Recreational drug use.  On your next visit with your primary care physician please Get Medicines reviewed and adjusted.  Please request your Prim.MD to go over all Hospital Tests and Procedure/Radiological results at the follow up, please get all Hospital records sent to your Prim MD by signing hospital release before you go home.  If you experience worsening  of your admission symptoms, develop shortness of breath, life threatening emergency, suicidal or homicidal thoughts you must seek medical attention immediately by calling 911 or calling your MD immediately  if symptoms less severe.  You Must read complete instructions/literature along with all the possible adverse reactions/side effects for all the Medicines you take and that have been  prescribed to you. Take any new Medicines after you have completely understood and accpet all the possible adverse reactions/side effects.   Discharge wound care:   Complete by: As directed    Left heel unstageable wound. - Clean left heel wound with saline, apply 1/4" thick layer of Santyl to the wound bed, top with 2x2 moist with saline, secure with dry dressing, kerlix, tape. Change daily.   Increase activity slowly   Complete by: As directed    MyChart COVID-19 home monitoring program   Complete by: Feb 08, 2021    Is the patient willing to use the [place] for home monitoring?: Yes   Temperature monitoring   Complete by: Feb 08, 2021    After how many days would you like to receive a notification of this patient's flowsheet entries?: 1       Discharge Medications   Allergies as of 02/08/2021       Reactions   Amlodipine Other (See Comments)   Unknown reaction   Codeine Other (See Comments)   Unknown reaction   Lotensin [benazepril] Other (See Comments)   Unknown reaction   Lotrel [amlodipine Besy-benazepril Hcl] Other (See Comments)   Unknown reaction   Naproxen Other (See Comments)   Unknown reaction   Tramadol Other (See Comments)   Unknown reaction        Medication List     STOP taking these medications    aspirin 81 MG chewable tablet   carvedilol 6.25 MG tablet Commonly known as: COREG   Eliquis 5 MG Tabs tablet Generic drug: apixaban       TAKE these medications    acetaminophen 500 MG tablet Commonly known as: TYLENOL Take 1,000 mg by mouth every 8 (eight) hours as needed for fever (pain).   atorvastatin 40 MG tablet Commonly known as: LIPITOR Take 40 mg by mouth at bedtime.   dabigatran 150 MG Caps capsule Commonly known as: PRADAXA Take 1 capsule (150 mg total) by mouth every 12 (twelve) hours.   hydrALAZINE 10 MG tablet Commonly known as: APRESOLINE Take 10 mg by mouth 4 (four) times daily. 8am, 12p, 4p, 8p (hold for SBP  <100)   insulin glargine 100 unit/mL Sopn Commonly known as: LANTUS Inject 26 Units into the skin at bedtime.   lisinopril-hydrochlorothiazide 20-25 MG tablet Commonly known as: ZESTORETIC Take 1 tablet by mouth every morning.   metoprolol tartrate 50 MG tablet Commonly known as: LOPRESSOR Take 1 tablet (50 mg total) by mouth 2 (two) times daily.   pantoprazole 40 MG tablet Commonly known as: PROTONIX Take 1 tablet (40 mg total) by mouth daily. Start taking on: February 09, 2021   thyroid 60 MG tablet Commonly known as: ARMOUR Take 60 mg by mouth every morning.   Trulicity 1.5 BP/1.0CH Sopn Generic drug: Dulaglutide Inject 1.5 mg into the skin every Thursday.   Vitamin D (Ergocalciferol) 1.25 MG (50000 UNIT) Caps capsule Commonly known as: DRISDOL Take 50,000 Units by mouth every Thursday.               Durable Medical Equipment  (From admission, onward)  Start     Ordered   02/08/21 1055  For home use only DME Walker rolling  Once       Comments: 5 wheel  Question Answer Comment  Walker: With [employer] Wheels   Patient needs a walker to treat with the following condition Weakness      02/08/21 1054              Discharge Care Instructions  (From admission, onward)           Start     Ordered   02/08/21 0000  Discharge wound care:       Comments: Left heel unstageable wound. - Clean left heel wound with saline, apply 1/4" thick layer of Santyl to the wound bed, top with 2x2 moist with saline, secure with dry dressing, kerlix, tape. Change daily.   02/08/21 1059             Follow-up Information     Earmon Phoenix, NP. Schedule an appointment as soon as possible for a visit in 1 week(s).   Specialty: Gerontology Contact information: [place] [address] [phone]         Waynetta Sandy, MD. Schedule an appointment as soon as possible for a visit in 1 week(s).   Specialties: Vascular Surgery,  Cardiology Why: Carotid artery disease. Contact information: [address] [place] [address] [phone]         [facility]. Schedule an appointment as soon as possible for a visit in 1 week(s).   Specialty: Neurology Contact information: [address] [place] [phone]                Major procedures and Radiology Reports - PLEASE review detailed and final reports thoroughly  -       CT ANGIO HEAD NECK W WO CM  Result Date: 02/07/2021 CLINICAL DATA:  Neuro deficit, acute stroke suspected. EXAM: CT ANGIOGRAPHY HEAD AND NECK TECHNIQUE: Multidetector CT imaging of the head and neck was performed using the standard protocol during bolus administration of intravenous contrast. Multiplanar CT image reconstructions and MIPs were obtained to evaluate the vascular anatomy. Carotid stenosis measurements (when applicable) are obtained utilizing NASCET criteria, using the distal internal carotid diameter as the denominator. CONTRAST:  79mL OMNIPAQUE IOHEXOL 350 MG/ML SOLN COMPARISON:  Same day MRI.  CT head from yesterday. FINDINGS: CT HEAD FINDINGS Brain: No significant change in edema associated with inferior left cerebellar acute infarct, better characterized on same day MRI. No evidence of new/interval large vascular territory infarct. No mass occupying acute hemorrhage. Remote right basal ganglia infarct. Similar atrophy and chronic microvascular ischemic disease. No midline shift. Basal cisterns are patent. No hydrocephalus. Vascular: See below. Skull: No acute fracture. Sinuses: Left maxillary sinus and left sphenoid sinus retention cyst. Ethmoid air cell and right maxillary sinus mucosal thickening. Orbits: No acute abnormality. Review of the MIP images confirms the above findings CTA NECK FINDINGS Aortic arch: The brachiocephalic, left subclavian, and left common carotid artery origins are not imaged. Visualized great  vessels are patent. Right carotid system: No evidence of dissection, stenosis (50% or greater) or occlusion. Left carotid system: Common carotid origin is not imaged. Makes calcific and noncalcific atherosclerosis at the carotid bifurcation without greater than 50% stenosis. Vertebral arteries: Mildly left dominant. No evidence of dissection, stenosis (50% or greater) or occlusion. Skeleton: Multilevel degenerative change of the cervical spine with bulky anterior osteophytes and facet mediated mild  anterolisthesis of C4 on C5. Other neck: No acute abnormality. Upper chest: Visualized lung apices are clear. Review of the MIP images confirms the above findings CTA HEAD FINDINGS Anterior circulation: Bilateral cavernous/paraclinoid calcific atherosclerosis without high-grade stenosis of the intracranial ICAs. Moderate to severe stenosis of the mid to distal right M1 MCA and the mid left M1 MCA. Multifocal atherosclerotic narrowing of bilateral M2 MCAs. Bilateral ACAs are patent. Posterior circulation: Severe stenosis versus occlusion of thea left AICA (series 12, images 124/125) with distal opacification. Right PICA is patent. Left PICA is not visualized. Bilateral intradural vertebral arteries are patent without greater than 50% stenosis. The basilar artery and bilateral posterior cerebral arteries are patent. Multifocal severe stenosis of the left P2 PCA. Severe distal right P1 PCA stenosis. Venous sinuses: As permitted by contrast timing, patent. Review of the MIP images confirms the above findings IMPRESSION: CT head: No significant change in edema associated with inferior left cerebellar acute infarct, better characterized on same day MRI. CTA Head: 1. Severe stenosis versus occlusion of the proximal left AICA with distal opacification. A left PICA is not visualized which could represent AICA dominance vs proximal PICA stenosis/occlusion. 2. Severe distal right P1 PCA and severe multifocal left P2 PCA stenosis. 3.  Moderate to severe stenosis of the bilateral M1 MCAs. CTA Neck: 1. Left greater than right carotid bifurcation atherosclerosis without greater than 50% stenosis. 2. Great vessel origins are not imaged. These results will be called to the ordering clinician or representative by the Radiologist Assistant, and communication documented in the PACS or [employer]. Electronically Signed   By: Margaretha Sheffield MD   On: 02/07/2021 10:43   CT Head Wo Contrast  Result Date: 02/06/2021 CLINICAL DATA:  Fall EXAM: CT HEAD WITHOUT CONTRAST CT CERVICAL SPINE WITHOUT CONTRAST TECHNIQUE: Multidetector CT imaging of the head and cervical spine was performed following the standard protocol without intravenous contrast. Multiplanar CT image reconstructions of the cervical spine were also generated. COMPARISON:  None. FINDINGS: CT HEAD FINDINGS Brain: Area of edema in the left cerebellum measures approximately 4.0 x 3.5 cm. There is an old infarct of the right basal ganglia. There is periventricular hypoattenuation compatible with chronic microvascular disease. There is generalized atrophy without lobar predilection. No acute hemorrhage Vascular: No abnormal hyperdensity of the major intracranial arteries or dural venous sinuses. No intracranial atherosclerosis. Skull: The visualized skull base, calvarium and extracranial soft tissues are normal. Sinuses/Orbits: No fluid levels or advanced mucosal thickening of the visualized paranasal sinuses. No mastoid or middle ear effusion. The orbits are normal. CT CERVICAL SPINE FINDINGS Alignment: No static subluxation. Facets are aligned. Occipital condyles are normally positioned. Skull base and vertebrae: No acute fracture. Soft tissues and spinal canal: No prevertebral fluid or swelling. No visible canal hematoma. Disc levels: Large central disc protrusion at C4-5 indents the ventral spinal cord. Small central disc protrusions also at C2-3 and C3-4 narrows the ventral thecal sac.  Upper chest: No pneumothorax, pulmonary nodule or pleural effusion. Other: Normal visualized paraspinal cervical soft tissues. IMPRESSION: 1. Area of edema in the left cerebellum without associated mass effect or acute hemorrhage, most likely a subacute infarct. MRI of the brain with and without contrast is recommended for further characterization and to exclude neoplasm. 2. No acute fracture or static subluxation of the cervical spine. 3. 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 Electronically Signed   By: Ulyses Jarred M.D.   On: 02/06/2021 20:01   CT Cervical Spine  Wo Contrast  Result Date: 02/06/2021 CLINICAL DATA:  Fall EXAM: CT HEAD WITHOUT CONTRAST CT CERVICAL SPINE WITHOUT CONTRAST TECHNIQUE: Multidetector CT imaging of the head and cervical spine was performed following the standard protocol without intravenous contrast. Multiplanar CT image reconstructions of the cervical spine were also generated. COMPARISON:  None. FINDINGS: CT HEAD FINDINGS Brain: Area of edema in the left cerebellum measures approximately 4.0 x 3.5 cm. There is an old infarct of the right basal ganglia. There is periventricular hypoattenuation compatible with chronic microvascular disease. There is generalized atrophy without lobar predilection. No acute hemorrhage Vascular: No abnormal hyperdensity of the major intracranial arteries or dural venous sinuses. No intracranial atherosclerosis. Skull: The visualized skull base, calvarium and extracranial soft tissues are normal. Sinuses/Orbits: No fluid levels or advanced mucosal thickening of the visualized paranasal sinuses. No mastoid or middle ear effusion. The orbits are normal. CT CERVICAL SPINE FINDINGS Alignment: No static subluxation. Facets are aligned. Occipital condyles are normally positioned. Skull base and vertebrae: No acute fracture. Soft tissues and spinal canal: No prevertebral fluid or swelling. No visible canal hematoma. Disc levels:  Large central disc protrusion at C4-5 indents the ventral spinal cord. Small central disc protrusions also at C2-3 and C3-4 narrows the ventral thecal sac. Upper chest: No pneumothorax, pulmonary nodule or pleural effusion. Other: Normal visualized paraspinal cervical soft tissues. IMPRESSION: 1. Area of edema in the left cerebellum without associated mass effect or acute hemorrhage, most likely a subacute infarct. MRI of the brain with and without contrast is recommended for further characterization and to exclude neoplasm. 2. No acute fracture or static subluxation of the cervical spine. 3. 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 Electronically Signed   By: Ulyses Jarred M.D.   On: 02/06/2021 20:01   MR BRAIN WO CONTRAST  Result Date: 02/07/2021 CLINICAL DATA:  Stroke follow-up EXAM: MRI HEAD WITHOUT CONTRAST TECHNIQUE: Multiplanar, multiecho pulse sequences of the brain and surrounding structures were obtained without intravenous contrast. COMPARISON:  01/04/2021 FINDINGS: Brain: Large acute infarct of the left cerebellum. Old right basal ganglia infarct. No acute or chronic hemorrhage. There is multifocal hyperintense T2-weighted signal within the white matter. Generalized volume loss without a clear lobar predilection. The midline structures are normal. Vascular: Major flow voids are preserved. Skull and upper cervical spine: Normal calvarium and skull base. Visualized upper cervical spine and soft tissues are normal. Sinuses/Orbits:Left maxillary retention cyst no mastoid or middle ear effusion. Normal orbits. IMPRESSION: 1. Large acute left cerebellar infarct. No hemorrhage or mass effect. 2. Old right basal ganglia infarct and findings of chronic microvascular ischemia. Electronically Signed   By: Ulyses Jarred M.D.   On: 02/07/2021 02:18   DG Pelvis Portable  Result Date: 02/06/2021 CLINICAL DATA:  Ground level fall EXAM: PORTABLE PELVIS 1-2 VIEWS COMPARISON:  None.  FINDINGS: There is no evidence of pelvic fracture or diastasis. Degenerative changes of the bilateral hips, right worse than left. No pelvic bone lesions are seen. Advanced vascular calcifications are present. IMPRESSION: Negative. Electronically Signed   By: Davina Poke D.O.   On: 02/06/2021 20:29   DG Chest Port 1 View  Result Date: 02/06/2021 CLINICAL DATA:  Ground level fall EXAM: PORTABLE CHEST 1 VIEW COMPARISON:  None. FINDINGS: Heart size within normal limits. Low lung volumes. Blunting of the left costophrenic angle may reflect small pleural effusion versus prominent epicardial fat pad. 11 mm nodular density projects within the left upper lobe. Right lung is clear. No pneumothorax.  No acute osseous abnormality. IMPRESSION: 1. Blunting of the left costophrenic angle may reflect small pleural effusion versus prominent epicardial fat pad. 2. 11 mm nodular density projects within the left upper lobe. Follow-up chest CT is recommended for further evaluation if recent outside imaging of the chest is unavailable. Electronically Signed   By: Davina Poke D.O.   On: 02/06/2021 20:26   ECHOCARDIOGRAM COMPLETE  Result Date: 02/07/2021    ECHOCARDIOGRAM REPORT   Patient Name:   Dorothy Barker Date of Exam: 02/07/2021 Medical Rec #:  [id number]   Height: Accession #:    [id number]  Weight: Date of Birth:  [birth year]  BSA: Patient Age:    16 years    BP:           143/62 mmHg Patient Gender: F           HR:           80 bpm. Exam Location:  Inpatient Procedure: 2D Echo, Cardiac Doppler and Color Doppler Indications:    CVA  History:        Patient has no prior history of Echocardiogram examinations.                 Stroke and PVD, Arrythmias:Atrial Flutter,                 Signs/Symptoms:Syncope and Pleural effusion; Risk                 Factors:Hypertension, Dyslipidemia, Diabetes and COVID+.  Sonographer:    Dustin Flock Referring Phys: [id number] Vernelle Emerald  Sonographer Comments: Patient is  morbidly obese. Image acquisition challenging due to patient body habitus. IMPRESSIONS  1. Left ventricular ejection fraction, by estimation, is 60 to 65%. The left ventricle has normal function. The left ventricle has no regional wall motion abnormalities. Left ventricular diastolic parameters are consistent with Grade I diastolic dysfunction (impaired relaxation).  2. Right ventricular systolic function is hyperdynamic. The right ventricular size is normal. There is normal pulmonary artery systolic pressure.  3. The mitral valve is abnormal. Trivial mitral valve regurgitation.  4. The aortic valve is tricuspid. Aortic valve regurgitation is trivial. Mild aortic valve sclerosis is present, with no evidence of aortic valve stenosis.  5. The inferior vena cava is normal in size with greater than 50% respiratory variability, suggesting right atrial pressure of 3 mmHg.  6. Echo free space mixed echogenic material overlying the RV apex, likely a fat pad or less likely a focal loculated pericardial effusion. Comparison(s): No prior Echocardiogram. FINDINGS  Left Ventricle: Left ventricular ejection fraction, by estimation, is 60 to 65%. The left ventricle has normal function. The left ventricle has no regional wall motion abnormalities. The left ventricular internal cavity size was normal in size. There is  no left ventricular hypertrophy. Left ventricular diastolic parameters are consistent with Grade I diastolic dysfunction (impaired relaxation). Indeterminate filling pressures. Right Ventricle: The right ventricular size is normal. No increase in right ventricular wall thickness. Right ventricular systolic function is hyperdynamic. There is normal pulmonary artery systolic pressure. The tricuspid regurgitant velocity is 2.84 m/s, and with an assumed right atrial pressure of 3 mmHg, the estimated right ventricular systolic pressure is 87.8 mmHg. Left Atrium: Left atrial size was normal in size. Right Atrium: Right  atrial size was normal in size. Pericardium: There is no evidence of pericardial effusion. Presence of pericardial fat pad. Mitral Valve: The mitral valve is abnormal. There is mild calcification of the anterior and  posterior mitral valve leaflet(s). Trivial mitral valve regurgitation. Tricuspid Valve: The tricuspid valve is grossly normal. Tricuspid valve regurgitation is mild. Aortic Valve: The aortic valve is tricuspid. Aortic valve regurgitation is trivial. Mild aortic valve sclerosis is present, with no evidence of aortic valve stenosis. Pulmonic Valve: The pulmonic valve was grossly normal. Pulmonic valve regurgitation is trivial. Aorta: The aortic root and ascending aorta are structurally normal, with no evidence of dilitation. Venous: The inferior vena cava is normal in size with greater than 50% respiratory variability, suggesting right atrial pressure of 3 mmHg. IAS/Shunts: No atrial level shunt detected by color flow Doppler. Additional Comments: There is pleural effusion in the left lateral region.  LEFT VENTRICLE PLAX 2D LVIDd:         3.60 cm  Diastology LVIDs:         2.50 cm  LV e' medial:    4.13 cm/s LV PW:         1.00 cm  LV E/e' medial:  12.0 LV IVS:        0.90 cm  LV e' lateral:   4.03 cm/s LVOT diam:     2.00 cm  LV E/e' lateral: 12.3 LV SV:         55 LVOT Area:     3.14 cm  RIGHT VENTRICLE RV Basal diam:  4.00 cm RV S prime:     7.07 cm/s TAPSE (M-mode): 2.3 cm LEFT ATRIUM             RIGHT ATRIUM LA diam:        2.80 cm RA Area:     10.50 cm LA Vol (A2C):   38.4 ml RA Volume:   24.70 ml LA Vol (A4C):   47.4 ml LA Biplane Vol: 45.0 ml  AORTIC VALVE LVOT Vmax:   95.00 cm/s LVOT Vmean:  66.400 cm/s LVOT VTI:    0.175 m  AORTA Ao Root diam: 2.70 cm MITRAL VALVE               TRICUSPID VALVE MV Area (PHT): 5.13 cm    TR Peak grad:   32.3 mmHg MV Decel Time: 148 msec    TR Vmax:        284.00 cm/s MV E velocity: 49.70 cm/s MV A velocity: 60.40 cm/s  SHUNTS MV E/A ratio:  0.82        Systemic  VTI:  0.18 m                            Systemic Diam: 2.00 cm Lyman Bishop MD Electronically signed by Lyman Bishop MD Signature Date/Time: 02/07/2021/5:46:58 PM    Final     Micro Results     Recent Results (from the past 240 hour(s))  Resp Panel by RT-PCR (Flu A&B, Covid) Nasopharyngeal Swab     Status: Abnormal   Collection Time: 02/06/21  7:55 PM   Specimen: Nasopharyngeal Swab; Nasopharyngeal(NP) swabs in vial transport medium  Result Value Ref Range Status   SARS Coronavirus 2 by RT PCR POSITIVE (A) NEGATIVE Final    Comment: RESULT CALLED TO, READ BACK BY AND VERIFIED WITH: RN JESSICA FEROLLOYD BR MESSAN H. AT [phone] ON [phone] (NOTE) SARS-CoV-2 target nucleic acids are DETECTED.  The SARS-CoV-2 RNA is generally detectable in upper respiratory specimens during the acute phase of infection. Positive results are indicative of the presence of the identified virus, but do not rule out bacterial infection  or co-infection with other pathogens not detected by the test. Clinical correlation with patient history and other diagnostic information is necessary to determine patient infection status. The expected result is Negative.  Fact Sheet for Patients: EntrepreneurPulse.com.au  Fact Sheet for Healthcare Providers: IncredibleEmployment.be  This test is not yet approved or cleared by the [place] FDA and  has been authorized for detection and/or diagnosis of SARS-CoV-2 by FDA under an Emergency Use Authorization (EUA).  This EUA will remain in effect (mea ning this test can be used) for the duration of  the COVID-19 declaration under Section 564(b)(1) of the Act, 21 U.S.C. section 360bbb-3(b)(1), unless the authorization is terminated or revoked sooner.     Influenza A by PCR NEGATIVE NEGATIVE Final   Influenza B by PCR NEGATIVE NEGATIVE Final    Comment: (NOTE) The Xpert Xpress SARS-CoV-2/FLU/RSV plus assay is intended as an aid in  the diagnosis of influenza from Nasopharyngeal swab specimens and should not be used as a sole basis for treatment. Nasal washings and aspirates are unacceptable for Xpert Xpress SARS-CoV-2/FLU/RSV testing.  Fact Sheet for Patients: EntrepreneurPulse.com.au  Fact Sheet for Healthcare Providers: IncredibleEmployment.be  This test is not yet approved or cleared by the [place] FDA and has been authorized for detection and/or diagnosis of SARS-CoV-2 by FDA under an Emergency Use Authorization (EUA). This EUA will remain in effect (meaning this test can be used) for the duration of the COVID-19 declaration under Section 564(b)(1) of the Act, 21 U.S.C. section 360bbb-3(b)(1), unless the authorization is terminated or revoked.  Performed at [facility], [place] [address], [place], [place] [address]     Today   Subjective    Dorothy Barker today has no headache,no chest abdominal pain,no new weakness tingling or numbness, feels much better wants to go home today.     Objective   Blood pressure (!) 152/56, pulse 77, temperature 98.7 F (37.1 C), temperature source Oral, resp. rate 18, height 5\' 1"  (1.549 m), weight 80 kg, SpO2 98 %.   Intake/Output Summary (Last 24 hours) at 02/08/2021 1108 Last data filed at 02/08/2021 0529 Gross per 24 hour  Intake 50 ml  Output 1675 ml  Net -1625 ml    Exam  Awake Alert, No new F.N deficits, Normal affect [place].AT,PERRAL Supple Neck,No JVD, No cervical lymphadenopathy appriciated.  Symmetrical Chest wall movement, Good air movement bilaterally, CTAB RRR,No Gallops,Rubs or new Murmurs, No Parasternal Heave +ve B.Sounds, Abd Soft, Non tender, No organomegaly appriciated, No rebound -guarding or rigidity. No Cyanosis, left heel unstageable decubitus wound present on admission see nursing note for details.   Data Review   CBC w Diff:  Lab Results  Component Value Date   WBC 6.7 02/08/2021   HGB  11.6 (L) 02/08/2021   HCT 35.0 (L) 02/08/2021   PLT 206 02/08/2021   LYMPHOPCT 31 02/08/2021   MONOPCT 12 02/08/2021   EOSPCT 3 02/08/2021   BASOPCT 1 02/08/2021    CMP:  Lab Results  Component Value Date   NA 137 02/08/2021   K 3.8 02/08/2021   CL 98 02/08/2021   CO2 25 02/08/2021   BUN 30 (H) 02/08/2021   CREATININE 1.36 (H) 02/08/2021   PROT 6.1 (L) 02/08/2021   ALBUMIN 2.7 (L) 02/08/2021   BILITOT 0.6 02/08/2021   ALKPHOS 49 02/08/2021   AST 23 02/08/2021   ALT 21 02/08/2021  .   Total Time in preparing paper work, data evaluation and todays exam - 35 minutes  Lala Lund M.D on 02/08/2021 at 11:08 AM  [facility]

## 2021-02-08 NOTE — Care Management CC44 (Signed)
Condition Code 44 Documentation Completed  Patient Details  Name: Dorothy Barker MRN: [id number] Date of Birth: September 16, 1940   Condition Code 44 given:  Yes Patient signature on Condition Code 44 notice:  Yes Documentation of 2 MD's agreement:  Yes Code 44 added to claim:  Yes    Ninfa Meeker, RN 02/08/2021, 9:28 AM

## 2021-02-08 NOTE — Evaluation (Signed)
Occupational Therapy Evaluation Patient Details Name: Dorothy Barker MRN: [id number] DOB: 08-27-1940 Today's Date: 02/08/2021    History of Present Illness 80yo female admitted 02/06/21 after a fall at her ALF. Incidentally Covid positive and MRI with acute L cerebellar infarct. PMH CKD, HTN, A-fib, DM, L heel wound   Clinical Impression   Pt admitted with the above diagnoses and presents with below problem list. Pt will benefit from continued acute OT to address the below listed deficits and maximize independence with basic ADLs prior to d/c back to ALF. At baseline, pt needs some assist with ADLs and functional transfers. Pt currently needs mod A with LB ADLs, min A with functional transfers. Up in recliner at end of session.      Follow Up Recommendations  Home health OT    Equipment Recommendations  None recommended by OT    Recommendations for Other Services       Precautions / Restrictions Precautions Precautions: Fall;Other (comment) Precaution Comments: covid +,  WB through ball of L foot in heel offloading shoe (no weight down through L heel due to wound) Required Braces or Orthoses: Other Brace Other Brace: L heel offloading shoe Restrictions Other Position/Activity Restrictions: L heel offloading shoe      Mobility Bed Mobility Overal bed mobility: Needs Assistance Bed Mobility: Supine to Sit     Supine to sit: Min assist;HOB elevated     General bed mobility comments: Min A to pivot hips to full EOB position. Limited by R knee pain.    Transfers Overall transfer level: Needs assistance Equipment used: Rolling walker (2 wheeled) Transfers: Sit to/from [facility] Sit to Stand: Min assist Stand pivot transfers: Min guard;Min assist       General transfer comment: EOB>recliner. Min A to boost to full uprigth standing position. Assist to control descent.    Balance Overall balance assessment: History of Falls;Needs  assistance Sitting-balance support: Bilateral upper extremity supported;Feet supported Sitting balance-Leahy Scale: Good     Standing balance support: Bilateral upper extremity supported;During functional activity Standing balance-Leahy Scale: Poor Standing balance comment: reliant on BUE support and external assist                           ADL either performed or assessed with clinical judgement   ADL Overall ADL's : Needs assistance/impaired Eating/Feeding: Set up;Sitting   Grooming: Set up;Sitting   Upper Body Bathing: Set up;Minimal assistance;Sitting   Lower Body Bathing: Moderate assistance;Sit to/from stand   Upper Body Dressing : Set up;Sitting;Minimal assistance   Lower Body Dressing: Moderate assistance;Sit to/from stand   Toilet Transfer: Minimal assistance;Stand-pivot;BSC;RW   Toileting- Clothing Manipulation and Hygiene: Moderate assistance;Sit to/from stand         General ADL Comments: Pt completed bed mobility, sat EOB a few minutes then took pivotal steps to sit up in recliner.     Vision         Perception     Praxis      Pertinent Vitals/Pain Pain Assessment: Faces Faces Pain Scale: Hurts little more Pain Location: R knee Pain Descriptors / Indicators: Aching;Sore Pain Intervention(s): Monitored during session;Limited activity within patient's tolerance;Repositioned     Hand Dominance     Extremity/Trunk Assessment Upper Extremity Assessment Upper Extremity Assessment: Generalized weakness;Overall [facility] for tasks assessed   Lower Extremity Assessment Lower Extremity Assessment: Defer to PT evaluation   Cervical / Trunk Assessment Cervical / Trunk Assessment: Kyphotic   Communication  Communication Communication: No difficulties   Cognition Arousal/Alertness: Awake/alert Behavior During Therapy: WFL for tasks assessed/performed Overall Cognitive Status: Within Functional Limits for tasks assessed                                      General Comments  VSS on RA    Exercises     Shoulder Instructions      Home Living Family/patient expects to be discharged to:: Assisted living                             Home Equipment: Walker - 2 wheels;Wheelchair - manual   Additional Comments: hasn't really used walker since early March- L heel wound was debrided and became very painful      Prior Functioning/Environment Level of Independence: Needs assistance        Comments: totalA for bathing/dressing, ALF has cafeteria; varies from a little bit of help to a lot of help        OT Problem List: Decreased strength;Decreased activity tolerance;Impaired balance (sitting and/or standing);Decreased knowledge of precautions;Decreased knowledge of use of DME or AE;Pain      OT Treatment/Interventions: Self-care/ADL training;DME and/or AE instruction;Therapeutic activities;Patient/family education;Balance training    OT Goals(Current goals can be found in the care plan section) Acute Rehab OT Goals Patient Stated Goal: go home, no more falls OT Goal Formulation: With patient Time For Goal Achievement: 02/22/21 Potential to Achieve Goals: Good ADL Goals Pt Will Perform Lower Body Bathing: with min assist;sit to/from stand Pt Will Perform Lower Body Dressing: with min assist;sit to/from stand Pt Will Transfer to Toilet: with min guard assist;stand pivot transfer Pt Will Perform Toileting - Clothing Manipulation and hygiene: with min assist;sit to/from stand Additional ADL Goal #1: Pt will complete bed mobility at supervision level to prepare for EOB/OOB ADLs.  OT Frequency: Min 2X/week   Barriers to D/C:            Co-evaluation              AM-PAC OT "6 Clicks" Daily Activity     Outcome Measure Help from another person eating meals?: None Help from another person taking care of personal grooming?: A Little Help from another person toileting, which includes using  toliet, bedpan, or urinal?: A Little Help from another person bathing (including washing, rinsing, drying)?: A Little Help from another person to put on and taking off regular upper body clothing?: A Little Help from another person to put on and taking off regular lower body clothing?: A Little 6 Click Score: 19   End of Session Equipment Utilized During Treatment: Rolling walker;Other (comment) (L heel offloading shoe) Nurse Communication: Other (comment) (NT: urinary incontinence, pt needs pericare and fresh gown)  Activity Tolerance: Patient tolerated treatment well Patient left: in chair;with call bell/phone within reach;with chair alarm set  OT Visit Diagnosis: Unsteadiness on feet (R26.81);Pain;Muscle weakness (generalized) (M62.81);History of falling (Z91.81)                Time: 5625-6389 OT Time Calculation (min): 25 min Charges:  OT General Charges $OT Visit: 1 Visit OT Evaluation $OT Eval Low Complexity: 1 Low OT Treatments $Self Care/Home Management : 8-22 mins  Tyrone Schimke, OT Acute Rehabilitation Services Pager: [phone] Office: [phone]   Dorothy Barker 02/08/2021, 12:15 PM

## 2021-02-08 NOTE — Care Management Obs Status (Signed)
[place] NOTIFICATION   Patient Details  Name: Dorothy Barker MRN: [id number] Date of Birth: Jul 18, 1941   Medicare Observation Status Notification Given:  Yes    Ninfa Meeker, RN 02/08/2021, 9:28 AM

## 2021-02-08 NOTE — NC FL2 (Signed)
[place] MEDICAID FL2 LEVEL OF CARE SCREENING TOOL     IDENTIFICATION  Patient Name: Dorothy Barker Birthdate: Jan 08, 1941 Sex: female Admission Date (Current Location): 02/06/2021  [facility] and [place] Number:  Herbalist and Address:  [facility], [place] [address], [place], [place] [address]      Provider Number: [id number]  Attending Physician Name and Address:  Thurnell Lose, MD  Relative Name and Phone Number:  Angela Nevin, daughter, [phone]    Current Level of Care: Hospital Recommended Level of Care: [place] Prior Approval Number:    Date Approved/Denied:   PASRR Number:    Discharge Plan: Other (Comment) (ALF)    Current Diagnoses: Patient Active Problem List   Diagnosis Date Noted   Pressure injury of skin 02/08/2021   Stroke ([place]) 02/08/2021   COVID-19 virus infection 02/07/2021   Left upper lobe pulmonary nodule 02/07/2021   Acute ischemic stroke ([place]) 02/06/2021   Fall at home, initial encounter 02/06/2021   Essential hypertension 02/06/2021   Hypothyroidism 02/06/2021   Paroxysmal atrial flutter ([place]) 02/06/2021   Type 2 diabetes mellitus with stage 3b chronic kidney disease, with long-term current use of insulin ([place]) 02/06/2021   Hypokalemia 02/06/2021   Chronic kidney disease, stage 3b ([place]) 02/06/2021    Orientation RESPIRATION BLADDER Height & Weight     Self, Time, Situation, Place  Normal Incontinent, External catheter Weight: 176 lb 5.9 oz (80 kg) Height:  5\' 1"  (154.9 cm)  BEHAVIORAL SYMPTOMS/MOOD NEUROLOGICAL BOWEL NUTRITION STATUS      Continent Diet (Regular)  AMBULATORY STATUS COMMUNICATION OF NEEDS Skin   Limited Assist Verbally PU Stage and Appropriate Care (Unstageable on heel)                       Personal Care Assistance Level of Assistance  Bathing, Feeding, Dressing Bathing Assistance: Maximum assistance Feeding assistance: Limited assistance Dressing Assistance:  Limited assistance     Functional Limitations Info             [facility]  PT (By licensed PT), OT (By licensed OT)     PT Frequency: Home Health PT 3x/week OT Frequency: Home Health OT 3x/week            Contractures Contractures Info: Not present    Additional Factors Info  Code Status, Allergies, Isolation Precautions, Insulin Sliding Scale Code Status Info: Full Allergies Info: Amlodipine, Codeine, Lotensin (Benazepril), Lotrel (Amlodipine Besy-benazepril Hcl), Naproxen, Tramadol   Insulin Sliding Scale Info: See DC Summary Isolation Precautions Info: COVID +     Current Medications (02/08/2021):   Discharge Medications: STOP taking these medications     aspirin 81 MG chewable tablet    carvedilol 6.25 MG tablet Commonly known as: COREG    Eliquis 5 MG Tabs tablet Generic drug: apixaban           TAKE these medications     acetaminophen 500 MG tablet Commonly known as: TYLENOL Take 1,000 mg by mouth every 8 (eight) hours as needed for fever (pain).    atorvastatin 40 MG tablet Commonly known as: LIPITOR Take 40 mg by mouth at bedtime.    dabigatran 150 MG Caps capsule Commonly known as: PRADAXA Take 1 capsule (150 mg total) by mouth every 12 (twelve) hours.    hydrALAZINE 10 MG tablet Commonly known as: APRESOLINE Take 10 mg by mouth 4 (four) times daily. 8am, 12p, 4p, 8p (hold for  SBP <100)    insulin glargine 100 unit/mL Sopn Commonly known as: LANTUS Inject 26 Units into the skin at bedtime.    lisinopril-hydrochlorothiazide 20-25 MG tablet Commonly known as: ZESTORETIC Take 1 tablet by mouth every morning.    metoprolol tartrate 50 MG tablet Commonly known as: LOPRESSOR Take 1 tablet (50 mg total) by mouth 2 (two) times daily.    pantoprazole 40 MG tablet Commonly known as: PROTONIX Take 1 tablet (40 mg total) by mouth daily. Start taking on: February 09, 2021    thyroid 60 MG tablet Commonly known as:  ARMOUR Take 60 mg by mouth every morning.    Trulicity 1.5 HW/8.0SU Sopn Generic drug: Dulaglutide Inject 1.5 mg into the skin every Thursday.    Vitamin D (Ergocalciferol) 1.25 MG (50000 UNIT) Caps capsule Commonly known as: DRISDOL Take 50,000 Units by mouth every Thursday.      Relevant Imaging Results:  Relevant Lab Results:   Additional Information SSN: [place], [place]

## 2021-02-09 LAB — HEMOGLOBIN A1C
Hgb A1c MFr Bld: 9 % — ABNORMAL HIGH (ref 4.8–5.6)
Mean Plasma Glucose: 212 mg/dL

## 2021-02-10 ENCOUNTER — Other Ambulatory Visit: Payer: Self-pay | Admitting: Neurology

## 2021-02-10 DIAGNOSIS — I639 Cerebral infarction, unspecified: Secondary | ICD-10-CM

## 2021-02-10 DIAGNOSIS — I4891 Unspecified atrial fibrillation: Secondary | ICD-10-CM

## 2021-02-10 DIAGNOSIS — I63133 Cerebral infarction due to embolism of bilateral carotid arteries: Secondary | ICD-10-CM

## 2021-02-11 ENCOUNTER — Telehealth: Payer: Self-pay | Admitting: Neurology

## 2021-02-11 NOTE — Telephone Encounter (Signed)
I called the patient, left a message, I will call back later.  Cardiac monitor showed episodes of atrial flutter.  No atrial fibrillation was seen.  Lone atrial flutter does carry a lower risk of embolic stroke, but atrial flutter in isolation is rare, and is usually associated with episodes of atrial fibrillation at some point.  I would consider a cardiology consultation concerning anticoagulation for this patient, I suspect anticoagulation should be initiated.   Cardiac monitor 02/10/21:  Narrative & Impression 1. NSR with sinus brady and sinus tachycardia 2. Periods of AVWB are present 3. Paroxysmal atrial flutter with a controled VR is present 4. No prolonged pauses 4. Noise artifact is present 5. No clear cut atrial fib 6. No VT or SVT

## 2021-02-11 NOTE — Telephone Encounter (Signed)
I called and left a message, I will call back again.

## 2021-02-14 NOTE — Telephone Encounter (Signed)
I called the patient and talking with daughter.  Since I have seen her, the patient had already had a cardiology consult and she has been placed on Eliquis.  She was on Eliquis at the time of a recent admission on 06 February 2021.  Patient had a COVID infection, but she was also found after a fall, MRI of the brain confirmed evidence of the left cerebellar stroke.  She had a stroke while on Eliquis.  She will be seen by cardiology in the future at the A. fib clinic.

## 2021-02-17 ENCOUNTER — Encounter: Payer: Self-pay | Admitting: Gerontology

## 2021-02-17 ENCOUNTER — Encounter (HOSPITAL_BASED_OUTPATIENT_CLINIC_OR_DEPARTMENT_OTHER): Payer: Medicare PPO | Admitting: Internal Medicine

## 2021-02-17 ENCOUNTER — Encounter (HOSPITAL_COMMUNITY): Payer: Self-pay | Admitting: Physician Assistant

## 2021-02-17 ENCOUNTER — Other Ambulatory Visit: Payer: Self-pay

## 2021-02-17 ENCOUNTER — Ambulatory Visit (HOSPITAL_COMMUNITY)
Admission: RE | Admit: 2021-02-17 | Discharge: 2021-02-17 | Disposition: A | Discharge status: Home / Self Care | Payer: Medicare PPO | Source: Ambulatory Visit | Attending: Physician Assistant | Admitting: Physician Assistant

## 2021-02-17 VITALS — BP 128/82 | HR 79 | Ht 60.0 in | Wt 174.0 lb

## 2021-02-17 DIAGNOSIS — E669 Obesity, unspecified: Secondary | ICD-10-CM | POA: Diagnosis not present

## 2021-02-17 DIAGNOSIS — E039 Hypothyroidism, unspecified: Secondary | ICD-10-CM | POA: Diagnosis not present

## 2021-02-17 DIAGNOSIS — E1122 Type 2 diabetes mellitus with diabetic chronic kidney disease: Secondary | ICD-10-CM | POA: Diagnosis not present

## 2021-02-17 DIAGNOSIS — Z885 Allergy status to narcotic agent status: Secondary | ICD-10-CM | POA: Diagnosis not present

## 2021-02-17 DIAGNOSIS — E785 Hyperlipidemia, unspecified: Secondary | ICD-10-CM | POA: Insufficient documentation

## 2021-02-17 DIAGNOSIS — I4510 Unspecified right bundle-branch block: Secondary | ICD-10-CM | POA: Diagnosis not present

## 2021-02-17 DIAGNOSIS — D6869 Other thrombophilia: Secondary | ICD-10-CM | POA: Insufficient documentation

## 2021-02-17 DIAGNOSIS — Z6833 Body mass index (BMI) 33.0-33.9, adult: Secondary | ICD-10-CM | POA: Diagnosis not present

## 2021-02-17 DIAGNOSIS — Z7182 Exercise counseling: Secondary | ICD-10-CM | POA: Diagnosis not present

## 2021-02-17 DIAGNOSIS — R200 Anesthesia of skin: Secondary | ICD-10-CM | POA: Insufficient documentation

## 2021-02-17 DIAGNOSIS — E1151 Type 2 diabetes mellitus with diabetic peripheral angiopathy without gangrene: Secondary | ICD-10-CM | POA: Diagnosis not present

## 2021-02-17 DIAGNOSIS — Z8673 Personal history of transient ischemic attack (TIA), and cerebral infarction without residual deficits: Secondary | ICD-10-CM | POA: Diagnosis not present

## 2021-02-17 DIAGNOSIS — Z888 Allergy status to other drugs, medicaments and biological substances status: Secondary | ICD-10-CM | POA: Diagnosis not present

## 2021-02-17 DIAGNOSIS — Z794 Long term (current) use of insulin: Secondary | ICD-10-CM | POA: Insufficient documentation

## 2021-02-17 DIAGNOSIS — Z905 Acquired absence of kidney: Secondary | ICD-10-CM | POA: Insufficient documentation

## 2021-02-17 DIAGNOSIS — N1832 Chronic kidney disease, stage 3b: Secondary | ICD-10-CM | POA: Insufficient documentation

## 2021-02-17 DIAGNOSIS — I129 Hypertensive chronic kidney disease with stage 1 through stage 4 chronic kidney disease, or unspecified chronic kidney disease: Secondary | ICD-10-CM | POA: Insufficient documentation

## 2021-02-17 DIAGNOSIS — I440 Atrioventricular block, first degree: Secondary | ICD-10-CM | POA: Diagnosis not present

## 2021-02-17 DIAGNOSIS — I4892 Unspecified atrial flutter: Secondary | ICD-10-CM | POA: Diagnosis present

## 2021-02-17 DIAGNOSIS — Z79899 Other long term (current) drug therapy: Secondary | ICD-10-CM | POA: Diagnosis not present

## 2021-02-17 DIAGNOSIS — Z7901 Long term (current) use of anticoagulants: Secondary | ICD-10-CM | POA: Insufficient documentation

## 2021-02-17 DIAGNOSIS — E10621 Type 1 diabetes mellitus with foot ulcer: Secondary | ICD-10-CM | POA: Diagnosis not present

## 2021-02-17 NOTE — Progress Notes (Signed)
[name], [name] ([id number]) Visit Report for 02/17/2021 Debridement Details Patient Name: Date of Service: Dorothy Barker, Dorothy Barker 02/17/2021 12:30 PM Medical Record Number: [id number] Patient Account Number: [id number] Date of Birth/Sex: Treating RN: 1941-01-03 (80 y.o. [name] Primary Care Provider: Anthonette Legato, LEE Other Clinician: Referring Provider: Treating Provider/Extender: Linton Ham MO RGA N, RUFFIN Weeks in Treatment: 16 Debridement Performed for Assessment: Wound #1 Left Calcaneus Performed By: Physician Ricard Dillon., MD Debridement Type: Debridement Severity of Tissue Pre Debridement: Fat layer exposed Level of Consciousness (Pre-procedure): Awake and Alert Pre-procedure Verification/Time Out Yes - 13:05 Taken: Start Time: 13:06 Pain Control: Lidocaine 4% T opical Solution T Area Debrided (L x W): otal 1.3 (cm) x 1.6 (cm) = 2.08 (cm) Tissue and other material debrided: Viable, Non-Viable, Slough, Subcutaneous, Skin: Dermis , Skin: Epidermis, Fibrin/Exudate, Slough Level: Skin/Subcutaneous Tissue Debridement Description: Excisional Instrument: Curette Bleeding: Moderate Hemostasis Achieved: Pressure End Time: 13:12 Procedural Pain: 0 Post Procedural Pain: 0 Response to Treatment: Procedure was tolerated well Level of Consciousness (Post- Awake and Alert procedure): Post Debridement Measurements of Total Wound Length: (cm) 1.3 Stage: Unstageable/Unclassified Width: (cm) 1.6 Depth: (cm) 0.3 Volume: (cm) 0.49 Character of Wound/Ulcer Post Debridement: Requires Further Debridement Severity of Tissue Post Debridement: Fat layer exposed Post Procedure Diagnosis Same as Pre-procedure Electronic Signature(s) Signed: 02/17/2021 5:19:12 PM By: Linton Ham MD Signed: 02/17/2021 6:57:58 PM By: Deon Pilling Entered By: Linton Ham on 02/17/2021 13:26:27 -------------------------------------------------------------------------------- HPI Details Patient  Name: Date of Service: Dorothy Barker 02/17/2021 12:30 PM Medical Record Number: [id number] Patient Account Number: [id number] Date of Birth/Sex: Treating RN: 1941-04-24 (80 y.o. [name] Primary Care Provider: Anthonette Legato, LEE Other Clinician: Referring Provider: Treating Provider/Extender: Linton Ham MO RGA N, RUFFIN Weeks in Treatment: 16 History of Present Illness HPI Description: 10/26/2020 upon evaluation today patient appears to be doing somewhat poorly in regard to wounds on her left heel and left lower leg. The leg is really not nearly as bad as the heel ulcer however. This is an unstageable pressure ulcer unfortunately that occurred when the patient was in the hospital secondary to a stroke. At this point the patient is seen with her daughter as well and both are able to answer and respond to questions. With that being said that the biggest issue right now is that the eschar on the heel is not really stable I think that this is going to need to be addressed with more specific and appropriate dressings currently. Apparently she did see a podiatrist more recently and they did perform some sharp debridement to clear away necrotic tissue here on the heel. T be honest the patient's daughter is not extremely pleased about that she states that they heal was completely dry and stable at that point. With that o being said obviously I did not see anything at that time and I cannot really speak to what the heel look like and then but right now it definitely appears to be much softer and I do not think just Betadine is good to do the job. I think we have to focus on trying to get the eschar off of the heel at this point. The patient is in a assisted living facility currently. With that being said the daughter also questions whether or not she is going require skilled nursing. She does have a history of diabetes mellitus type 2. She also has a history of chronic venous insufficiency. 3/15;  patient admitted to the clinic last week.  She has an unstageable pressure ulcer on the left heel that apparently occurred at [facility] when she was recovering from the stroke. She spent some time at [facility] and now is in [facility] assisted living. She has home health twice a week we have been using Iodoflex on this area. It was pointed out last week that she does not have palpable pulses below the femoral we ordered arterial studies but her daughter has not heard anything about this we will recheck this. 3/22; unstageable pressure ulcer on the left heel and an area on her left posterior calf. She had her this showed an ABI in the left of 1.15 however monophasic waveforms in the great toe TBI of 0.18 with a pressure of 39. Doppler analysis revealed monophasic waveforms at the CFA which by my way of thinking might suggest proximal disease she had a 50-74 stenosis of the distal SFA with a focal velocity elevation of 357 cm/s. We have been using Iodoflex to both wound areas. The patient does not really complain of a lot of pain but she is not that ambulatory. 3/29 still unstageable area with eschar on the tip of the left heel. This is gradually separating. We have a vascular consult on April 5 4/21; patient was last seen almost 1 month ago. She was seen by Dr. Roselie Awkward and had a angiogram of the left lower extremity with above-the-knee popliteal artery stenting. Patient states she feels well and has no complaints today. 5/3 the patient had an angiogram I have not reviewed this. Per Dr. Jodene Nam last note she had a stent to the left popliteal artery but I do not know the condition of the tibial vessels. 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 Fortunately the posterior tibial is the dominant flow to the foot. Her popliteal artery was stented. She followed up yesterday her ABI on the left was 1.23 monophasic waveforms but the TBI increased from 0.18-0.54 which is a major improvement. She has an area on the tip of her left heel and a small area on the left medial lower leg 6/2; the patient has been revascularized as noted above. She is using Santyl on the left heel. The area on the left medial lower leg is closed. Still debris which was ischemic eschar on the left heel. I crosshatched this 3 weeks ago this is gradually been falling off. 6/16; 2-week follow-up. Still using Santyl on the left heel. Making really nice progress status post revascularization 6/30; 2-week follow-up. Using Santyl on the heel. Lives at [facility] assisted living. She had a fall since she was last here ended up being admitted to the hospital. 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 Needed change in her anticoagulation apparently from Eliquis to Pradaxa. There were careful about his heel in the hospital according to her Electronic Signature(s) Signed: 02/17/2021 5:19:12 PM By: Linton Ham MD Entered By: Linton Ham on 02/17/2021 13:27:48 -------------------------------------------------------------------------------- Physical Exam Details Patient Name: Date of Service: Dorothy Barker 02/17/2021 12:30 PM Medical Record Number: [id number] Patient Account Number: [id number] Date of Birth/Sex: Treating RN: 1940/12/07 (80 y.o. [name] Primary Care Provider: Anthonette Legato, LEE Other Clinician:  Referring Provider: Treating Provider/Extender: Linton Ham MO RGA N, RUFFIN Weeks in Treatment: 69 Constitutional Patient is hypertensive.. Pulse regular and within target range for patient.Marland Kitchen Respirations regular, non-labored and within target range.. Temperature is normal and within the target range for  the patient.Marland Kitchen Appears in no distress. Cardiovascular Pedal pulses are palpable. Notes Wound exam; left heel Achilles aspect. I used a #3 curette to aggressively debride necrotic material from the surfaces wound into subcutaneous tissue hemostasis with direct pressure this does not probe to bone there is no evidence of surrounding infection Electronic Signature(s) Signed: 02/17/2021 5:19:12 PM By: Linton Ham MD Entered By: Linton Ham on 02/17/2021 13:29:01 -------------------------------------------------------------------------------- Physician Orders Details Patient Name: Date of Service: Dorothy Barker 02/17/2021 12:30 PM Medical Record Number: [id number] Patient Account Number: [id number] Date of Birth/Sex: Treating RN: 1941/08/19 (80 y.o. 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 - Dr. Dellia Nims Return A Bathing/ Shower/ Hygiene May shower with protection but do not get wound dressing(s) wet. - patient may shower with cast protector with normal showering schedule. patient may wash with soap and water wound, leg, and foot with dressing changes. Edema Control - Lymphedema / SCD / Other Elevate legs to the level of the heart or above for 30 minutes daily and/or when sitting, a frequency of: Avoid standing for long periods of time. Moisturize legs daily. Off-Loading Other: - offloading shoe for left  heel when pt. is up and walking. Pt. may wear pressure-relieving boots at bedtime. Patient may walk with PT as long as patient is wearing offloading shoe. Home Health No change in wound care orders this week; continue Home Health for wound care. May utilize formulary equivalent dressing for wound treatment orders unless otherwise specified. [facility] home health Monday's, Wednesday's, and Friday's. Every other Tuesday at wound center and all other days daughter to change. Wound Treatment Wound #1 - Calcaneus Wound Laterality: Left Cleanser: Soap and Water (Home Health) 1 x Per LNL/89 Days Discharge Instructions: May shower and wash wound with dial antibacterial soap and water prior to dressing change. Cleanser: Wound Cleanser (Home Health) 1 x Per Day/15 Days Discharge Instructions: Cleanse the wound with wound cleanser prior to applying a clean dressing using gauze sponges, not tissue or cotton balls. Prim Dressing: Santyl Ointment (Home Health) 1 x Per Day/15 Days ary Discharge Instructions: Apply nickel thick amount to wound bed as instructed Secondary Dressing: Woven Gauze Sponge, Non-Sterile 4x4 in (Home Health) 1 x Per Day/15 Days Discharge Instructions: Apply over primary dressing as directed. Secondary Dressing: ABD Pad, 5x9 (Home Health) 1 x Per Day/15 Days Discharge Instructions: Apply over primary dressing as directed. Secured With: [employer], 4.5x3.1 (in/yd) (Home Health) 1 x Per Day/15 Days Discharge Instructions: Secure with Kerlix as directed. Secured With: 40M Medipore H Soft Cloth Surgical T 4 x 2 (in/yd) (Home Health) 1 x Per Day/15 Days ape Discharge Instructions: Secure dressing with tape as directed. Electronic Signature(s) Signed: 02/17/2021 5:19:12 PM By: Linton Ham MD Signed: 02/17/2021 6:57:58 PM  By: Deon Pilling Entered By: Deon Pilling on 02/17/2021 13:15:30 -------------------------------------------------------------------------------- Problem  List Details Patient Name: Date of Service: JENEEN, DOUTT 02/17/2021 12:30 PM Medical Record Number: [id number] Patient Account Number: [id number] Date of Birth/Sex: Treating RN: Apr 07, 1941 (80 y.o. 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 [name] Primary Care Provider: Anthonette Legato, LEE Other Clinician: Referring Provider: Treating Provider/Extender: Linton Ham MO RGA N, RUFFIN Weeks in Treatment: 16 Subjective History of Present Illness (HPI) 10/26/2020 upon evaluation today patient appears to be doing somewhat poorly in regard to wounds on her left heel and left lower leg. The leg is really not nearly as bad as the heel ulcer however. This is an unstageable pressure ulcer unfortunately that occurred when the patient was in the hospital secondary to a stroke. At this point the patient is seen with her daughter as well  and both are able to answer and respond to questions. With that being said that the biggest issue right now is that the eschar on the heel is not really stable I think that this is going to need to be addressed with more specific and appropriate dressings currently. Apparently she did see a podiatrist more recently and they did perform some sharp debridement to clear away necrotic tissue here on the heel. T be honest o the patient's daughter is not extremely pleased about that she states that they heal was completely dry and stable at that point. With that being said obviously I did not see anything at that time and I cannot really speak to what the heel look like and then but right now it definitely appears to be much softer and I do not think just Betadine is good to do the job. I think we have to focus on trying to get the eschar off of the heel at this point. The patient is in a assisted living facility currently. With that being said the daughter also questions whether or not she is going require skilled nursing. She does have a history of diabetes mellitus type 2. She also has a history of chronic venous insufficiency. 3/15; patient admitted to the clinic last week. She has an unstageable pressure ulcer on the left heel that apparently occurred at [facility] when she was recovering from the stroke. She spent some time at the rehab unit of [facility] and now is in [facility] assisted living. She has home health twice a week we have been using Iodoflex on this area. It was pointed out last week that she does not have palpable pulses below the femoral we ordered arterial studies but her daughter has not heard anything about this we  will recheck this. 3/22; unstageable pressure ulcer on the left heel and an area on her left posterior calf. She had her this showed an ABI in the left of 1.15 however monophasic waveforms in the great toe TBI of 0.18 with a pressure of 39. Doppler analysis revealed  monophasic waveforms at the CFA which by my way of thinking might suggest proximal disease she had a 50-74 stenosis of the distal SFA with a focal velocity elevation of 357 cm/s. We have been using Iodoflex to both wound areas. The patient does not really complain of a lot of pain but she is not that ambulatory. 3/29 still unstageable area with eschar on the tip of the left heel. This is gradually separating. We have a vascular consult on April 5 4/21; patient was last seen almost 1 month ago. She was seen by Dr. Roselie Awkward and had a angiogram of the left lower extremity with above-the-knee popliteal artery stenting. Patient states she feels well and has no complaints today. 5/3 the patient had an angiogram I have not reviewed this. Per Dr. Jodene Nam last note she had a stent to the left popliteal artery but I do not know the condition of the tibial vessels. 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 Fortunately the posterior tibial is the dominant flow to the foot. Her popliteal artery was stented. She followed up yesterday her ABI on the left was 1.23 monophasic waveforms but the TBI increased from 0.18-0.54 which is a major improvement. She has an area on the tip of her left heel and a small area on the left medial lower leg 6/2; the patient has been revascularized as noted above. She is using Santyl on the left heel. The area on the left medial lower leg is closed. Still debris which was ischemic eschar on the left heel. I crosshatched this 3 weeks ago this is gradually been falling off. 6/16; 2-week follow-up. Still using Santyl on the left heel. Making really nice progress status post  revascularization 6/30; 2-week follow-up. Using Santyl on the heel. Lives at [facility] assisted living. She had a fall since she was last here ended up being admitted to the hospital. 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 Needed change in her anticoagulation apparently from Eliquis to Pradaxa. There were careful about his heel in the hospital according to her Patient History Information obtained from Patient. Family History Cancer - Mother,Maternal Grandparents, Diabetes - Child, Hypertension - Father, Stroke - Mother,Father, No family history of Heart Disease, Hereditary Spherocytosis, Kidney Disease, Lung Disease, Seizures, Thyroid Problems, Tuberculosis. Social History Never smoker, Marital Status - Divorced, Alcohol Use - Never, Drug Use - No History, Caffeine Use - Daily. Medical History Eyes Patient has history of Cataracts Denies history of Glaucoma, Optic Neuritis Cardiovascular Patient has history of Hypertension Endocrine Patient has history of Type II Diabetes Denies history of Type I Diabetes Genitourinary Denies history of End Stage Renal Disease Integumentary (Skin) Denies history of History of Burn Musculoskeletal Patient has history of Osteoarthritis Denies history of Gout, Rheumatoid Arthritis, Osteomyelitis Neurologic Patient has history of Dementia Denies history of Neuropathy, Quadriplegia, Paraplegia, Seizure Disorder Oncologic Denies history of Received Chemotherapy, Received Radiation Psychiatric Denies  history of Anorexia/bulimia, Confinement Anxiety Hospitalization/Surgery History - left nephrectomy. - hysterectomy. - right knee replacement. - bil shoulder rotator cuff repair. - tonsillectomy. - appendectomy. - cholecystectomy. - fall inpatient 02/06/2021-02/10/2021 questioning CVA, noted A. Flutter. Medical A Surgical History Notes nd Cardiovascular hyperlipidemia Endocrine hypothyroidism Genitourinary hx renal cancer, CKD Neurologic hx  CVA Oncologic renal cancer Objective Constitutional Patient is hypertensive.. Pulse regular and within target range for patient.Marland Kitchen Respirations regular, non-labored and within target range.. Temperature is normal and within the target range for the patient.Marland Kitchen Appears in no distress. Vitals Time Taken: 12:50 PM, Height: 62 in, Weight: 173 lbs, BMI: 31.6, Temperature: 97.9 F, Pulse: 85 bpm, Respiratory Rate: 18 breaths/min, Blood Pressure: 179/81 mmHg. Cardiovascular Pedal pulses are palpable. General Notes: Wound exam; left heel Achilles aspect. I used a #3 curette to aggressively debride necrotic material from the surfaces wound into subcutaneous tissue hemostasis with direct pressure this does not probe to bone there is no evidence of surrounding infection Integumentary (Hair, Skin) Wound #1 status is Open. Original cause of wound was Pressure Injury. The date acquired was: 09/21/2020. The wound has been in treatment 16 weeks. The wound is located on the Left Calcaneus. The wound measures 1.3cm length x 1.6cm width x 0.2cm depth; 1.634cm^2 area and 0.327cm^3 volume. There is Fat Layer (Subcutaneous Tissue) exposed. There is no tunneling or undermining noted. There is a small amount of serosanguineous drainage noted. The wound margin is distinct with the outline attached to the wound base. There is no granulation within the wound bed. There is a large (67-100%) amount of necrotic tissue within the wound bed including Adherent Slough. 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 There was a Excisional Skin/Subcutaneous Tissue Debridement with a total area of 2.08 sq cm performed by Ricard Dillon., MD. With the following instrument(s): Curette to remove Viable and Non-Viable tissue/material. Material removed includes Subcutaneous Tissue, Slough, Skin: Dermis, Skin: Epidermis, and Fibrin/Exudate after achieving pain control using Lidocaine 4% Topical Solution. A time out was conducted at 13:05, prior to the start of the procedure. A Moderate amount of bleeding was controlled with Pressure. The procedure was tolerated well with a pain level of 0 throughout and a pain level of 0 following the procedure. Post Debridement Measurements: 1.3cm length x 1.6cm width x 0.3cm depth; 0.49cm^3 volume. Post debridement Stage noted as Unstageable/Unclassified. Character of Wound/Ulcer Post Debridement requires further debridement. Severity of Tissue Post Debridement is: Fat layer exposed. Post procedure Diagnosis Wound #1: Same as Pre-Procedure Plan Follow-up Appointments: Return Appointment in 1 week. - Dr. Dellia Nims Bathing/ Shower/ Hygiene: May shower with protection but do not get wound dressing(s) wet. - patient may shower with cast protector with normal showering schedule. patient may wash with soap and water wound, leg, and foot with dressing changes. Edema Control - Lymphedema / SCD / Other: Elevate legs to the level of the heart or above for 30 minutes daily and/or when sitting, a frequency of: Avoid standing for long periods of time. Moisturize legs daily. Off-Loading: Other: - offloading shoe for left heel when pt. is up and walking. Pt. may  wear pressure-relieving boots at bedtime. Patient may walk with PT as long as patient is wearing offloading shoe. Home Health: No change in wound care orders this week; continue Home Health for wound care. May utilize formulary equivalent dressing for wound treatment orders unless otherwise  specified. [facility] home health Monday's, Wednesday's, and Friday's. Every other Tuesday at wound center and all other days daughter to change. WOUND #1: - Calcaneus Wound Laterality: Left Cleanser: Soap and Water (Home Health) 1 x Per GLO/75 Days Discharge Instructions: May shower and wash wound with dial antibacterial soap and water prior to dressing change. Cleanser: Wound Cleanser (Home Health) 1 x Per Day/15 Days Discharge Instructions: Cleanse the wound with wound cleanser prior to applying a clean dressing using gauze sponges, not tissue or cotton balls. Prim Dressing: Santyl Ointment (Home Health) 1 x Per Day/15 Days ary Discharge Instructions: Apply nickel thick amount to wound bed as instructed Secondary Dressing: Woven Gauze Sponge, Non-Sterile 4x4 in (Home Health) 1 x Per Day/15 Days Discharge Instructions: Apply over primary dressing as directed. Secondary Dressing: ABD Pad, 5x9 (Home Health) 1 x Per Day/15 Days Discharge Instructions: Apply over primary dressing as directed. Secured With: [employer], 4.5x3.1 (in/yd) (Home Health) 1 x Per Day/15 Days Discharge Instructions: Secure with Kerlix as directed. Secured With: 36M Medipore H Soft Cloth Surgical T 4 x 2 (in/yd) (Home Health) 1 x Per Day/15 Days ape Discharge Instructions: Secure dressing with tape as directed. 1. Continue with Santyl 2. I going to bring her back next week to see if we can continue with the debridement process. Not aggressive enough so far Electronic Signature(s) Signed: 02/17/2021 5:19:12 PM By: Linton Ham MD Entered By: Linton Ham on 02/17/2021 13:29:52 -------------------------------------------------------------------------------- HxROS Details Patient Name: Date of Service: Dorothy Barker 02/17/2021 12:30 PM Medical Record Number: [id number] Patient Account Number: [id number] Date of Birth/Sex: Treating RN: [birth year] (80 y.o. [name] Primary Care Provider: Anthonette Legato,  LEE Other Clinician: Referring Provider: Treating Provider/Extender: Linton Ham MO RGA N, RUFFIN Weeks in Treatment: 16 Information Obtained From Patient Eyes Medical History: Positive for: Cataracts Negative for: Glaucoma; Optic Neuritis Cardiovascular Medical History: Positive for: Hypertension Past Medical History Notes: hyperlipidemia Endocrine Medical History: Positive for: Type II Diabetes Negative for: Type I Diabetes Past Medical History Notes: hypothyroidism Time with diabetes: 10 yrs Treated with: Insulin Blood sugar tested every day: Yes Tested : daily Genitourinary Medical History: Negative for: End Stage Renal Disease Past Medical History Notes: hx renal cancer, CKD Integumentary (Skin) Medical History: Negative for: History of Burn Musculoskeletal Medical History: Positive for: Osteoarthritis Negative for: Gout; Rheumatoid Arthritis; Osteomyelitis Neurologic Medical History: Positive for: Dementia Negative for: Neuropathy; Quadriplegia; Paraplegia; Seizure Disorder Past Medical History Notes: hx CVA Oncologic Medical History: Negative for: Received Chemotherapy; Received Radiation Past Medical History Notes: renal cancer Psychiatric Medical History: Negative for: Anorexia/bulimia; Confinement Anxiety HBO Extended History Items Eyes: Cataracts Immunizations Pneumococcal Vaccine: Received Pneumococcal Vaccination: No Implantable Devices No devices added Hospitalization / Surgery History Type of Hospitalization/Surgery left nephrectomy hysterectomy right knee replacement bil shoulder rotator cuff repair tonsillectomy appendectomy cholecystectomy fall inpatient 02/06/2021-02/10/2021 questioning CVA, noted A. Flutter Family and Social History Cancer: Yes - Mother,Maternal Grandparents; Diabetes: Yes - Child; Heart Disease: No; Hereditary Spherocytosis: No; Hypertension: Yes - Father; Kidney Disease: No; Lung Disease: No; Seizures:  No; Stroke: Yes - Mother,Father; Thyroid Problems: No; Tuberculosis: No; Never smoker; Marital Status - Divorced; Alcohol Use: Never; Drug Use: No History; Caffeine Use: Daily; Financial Concerns:  No; Food, Clothing or Shelter Needs: No; Support System Lacking: No; Transportation Concerns: No Electronic Signature(s) Signed: 02/17/2021 5:19:12 PM By: Linton Ham MD Signed: 02/17/2021 6:57:58 PM By: Deon Pilling Entered By: Deon Pilling on 02/17/2021 13:11:27 -------------------------------------------------------------------------------- SuperBill Details Patient Name: Date of Service: Dorothy Barker 02/17/2021 Medical Record Number: [id number] Patient Account Number: [id number] Date of Birth/Sex: Treating RN: 1941-03-21 (79 y.o. 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

## 2021-02-17 NOTE — Progress Notes (Signed)
Primary Care Physician: Earmon Phoenix, NP Primary Cardiologist: none Primary Electrophysiologist: none Referring Physician: Dr Eustace Quail is a 80 y.o. female with a history of CKD, DM, HLD, HTN, prior CVA, PVD, hypothyroidism, atrial flutter who presents for consultation in the [facility].  The patient was initially diagnosed with atrial flutter on a 30 day cardiac monitor after CVA. Patient was started on Eliquis for a CHADS2VASC score of 8. She was not aware of her arrhythmia at the time. She denies significant snoring or alcohol use.   On follow up today, patient was admitted with acute CVA and fall on 02/06/21. Felt to be an Eliquis failure and she was changed to Pradaxa. 30 day event monitor showed 2 minutes of atrial flutter, no afib, 2nd degree AV block Mobitz 1 was present. Echo showed normal EF, no significant valve issues. Her primary concern today is her numbness in her right hand which started in April.   Today, she denies symptoms of palpitations, chest pain, shortness of breath, orthopnea, PND, lower extremity edema, dizziness, presyncope, syncope, snoring, daytime somnolence, bleeding. The patient is tolerating medications without difficulties and is otherwise without complaint today.    Atrial Fibrillation Risk Factors:  she does not have symptoms or diagnosis of sleep apnea. she does not have a history of rheumatic fever. she does not have a history of alcohol use.   she has a BMI of Body mass index is 33.98 kg/m.Marland Kitchen Filed Weights   02/17/21 1436  Weight: 78.9 kg     Family History  Problem Relation Age of Onset   Heart disease Neg Hx      Atrial Fibrillation Management history:  Previous antiarrhythmic drugs: none Previous cardioversions: none Previous ablations: none CHADS2VASC score: 8 Anticoagulation history: Eliquis, Pradaxa   Past Medical History:  Diagnosis Date   Cancer ([place])    kidney   Chronic kidney  disease    Chronic kidney disease, stage 3b ([place]) 02/06/2021   Diabetes mellitus without complication ([place])    Essential hypertension 02/06/2021   Hyperlipidemia    Hypertension    Hypothyroidism 02/06/2021   Paroxysmal atrial flutter ([place]) 02/06/2021   Stroke ([place])    Thyroid disease    Type 2 diabetes mellitus with stage 3b chronic kidney disease, with long-term current use of insulin ([place]) 02/06/2021   Vitamin D deficiency    Past Surgical History:  Procedure Laterality Date   ABDOMINAL AORTOGRAM W/LOWER EXTREMITY N/A 12/01/2020   Procedure: ABDOMINAL AORTOGRAM W/LOWER EXTREMITY;  Surgeon: Cherre Robins, MD;  Location: [facility];  Service: Cardiovascular;  Laterality: N/A;   ABDOMINAL HYSTERECTOMY     KNEE SURGERY Right    NEPHRECTOMY Left    PERIPHERAL VASCULAR INTERVENTION Left 12/01/2020   Procedure: PERIPHERAL VASCULAR INTERVENTION;  Surgeon: Cherre Robins, MD;  Location: [facility];  Service: Cardiovascular;  Laterality: Left;   TONSILLECTOMY      Current Outpatient Medications  Medication Sig Dispense Refill   acetaminophen (TYLENOL) 500 MG tablet Take 1,000 mg by mouth every 8 (eight) hours as needed for mild pain or moderate pain.     atorvastatin (LIPITOR) 40 MG tablet Take 40 mg by mouth at bedtime.     dabigatran (PRADAXA) 150 MG CAPS capsule Take 1 capsule (150 mg total) by mouth every 12 (twelve) hours. 60 capsule 0   Dulaglutide (TRULICITY) 1.5 OM/7.6HM SOPN Inject 1.5 mg into the skin every Thursday.  hydrALAZINE (APRESOLINE) 10 MG tablet Take 10 mg by mouth 4 (four) times daily. 8am, 12p, 4p, 8p (hold for SBP <100)     insulin glargine (LANTUS) 100 unit/mL SOPN Inject 26 Units into the skin at bedtime.     lisinopril-hydrochlorothiazide (ZESTORETIC) 20-25 MG tablet Take 1 tablet by mouth every morning.     metoprolol tartrate (LOPRESSOR) 50 MG tablet Take 1 tablet (50 mg total) by mouth 2 (two) times daily. 60 tablet 0   pantoprazole (PROTONIX)  40 MG tablet Take 1 tablet (40 mg total) by mouth daily. 30 tablet 0   thyroid (ARMOUR) 60 MG tablet Take 60 mg by mouth every morning.     Vitamin D, Ergocalciferol, (DRISDOL) 1.25 MG (50000 UNIT) CAPS capsule Take 50,000 Units by mouth every Thursday.     No current facility-administered medications for this encounter.    Allergies  Allergen Reactions   Amlodipine Other (See Comments)    Unknown reaction   Codeine Other (See Comments)    Unknown reaction   Lotensin [Benazepril] Other (See Comments)    Unknown reaction   Lotrel [Amlodipine Besy-Benazepril Hcl] Other (See Comments)    Unknown reaction   Naproxen Other (See Comments)    Unknown reaction   Tramadol Other (See Comments)    Syncopal episodes    Tramadol Other (See Comments)    Unknown reaction   Amlodipine Besy-Benazepril Hcl Rash   Codeine Rash   Naproxen Rash    Social History   Socioeconomic History   Marital status: Single    Spouse name: Not on file   Number of children: Not on file   Years of education: Not on file   Highest education level: Not on file  Occupational History   Not on file  Tobacco Use   Smoking status: Never    Passive exposure: Yes   Smokeless tobacco: Never  Vaping Use   Vaping Use: Never used  Substance and Sexual Activity   Alcohol use: Never   Drug use: Never   Sexual activity: Not on file  Other Topics Concern   Not on file  Social History Narrative   ** Merged History Encounter **       Resides at [facility] [place] Right Handed Drinks 1 cups caffeine daily   Social Determinants of Health   Financial Resource Strain: Not on file  Food Insecurity: Not on file  Transportation Needs: Not on file  Physical Activity: Not on file  Stress: Not on file  Social Connections: Not on file  Intimate Partner Violence: Not on file     ROS- All systems are reviewed and negative except as per the HPI above.  Physical Exam: Vitals:   02/17/21 1436   BP: 128/82  Pulse: 79  Weight: 78.9 kg  Height: 5' (1.524 m)    GEN- The patient is a well appearing obese elderly female, alert and oriented x 3 today.   HEENT-head normocephalic, atraumatic, sclera clear, conjunctiva pink, hearing intact, trachea midline. Lungs- Clear to ausculation bilaterally, normal work of breathing Heart- Regular rate and rhythm, no murmurs, rubs or gallops  GI- soft, NT, ND, + BS Extremities- no clubbing, cyanosis, or edema MS- no significant deformity or atrophy Skin- ecchymosis over right eye and on right arm. Psych- euthymic mood, full affect Neuro- strength and sensation are intact   Wt Readings from Last 3 Encounters:  02/17/21 78.9 kg  02/07/21 80 kg  02/01/21 78.9 kg    EKG today  demonstrates  SR, marked 1st degree AV block, RBBB Vent. rate 79 BPM PR interval 340 ms QRS duration 130 ms QT/QTcB 398/456 ms  Echo 02/07/21  1. Left ventricular ejection fraction, by estimation, is 60 to 65%. The  left ventricle has normal function. The left ventricle has no regional  wall motion abnormalities. Left ventricular diastolic parameters are  consistent with Grade I diastolic  dysfunction (impaired relaxation).   2. Right ventricular systolic function is hyperdynamic. The right  ventricular size is normal. There is normal pulmonary artery systolic  pressure.   3. The mitral valve is abnormal. Trivial mitral valve regurgitation.   4. The aortic valve is tricuspid. Aortic valve regurgitation is trivial.  Mild aortic valve sclerosis is present, with no evidence of aortic valve  stenosis.   5. The inferior vena cava is normal in size with greater than 50%  respiratory variability, suggesting right atrial pressure of 3 mmHg.   6. Echo free space mixed echogenic material overlying the RV apex, likely  a fat pad or less likely a focal loculated pericardial effusion.   Epic records are reviewed at length today  CHA2DS2-VASc Score = 8  The patient's  score is based upon: CHF History: No HTN History: Yes Diabetes History: Yes Stroke History: Yes Vascular Disease History: Yes Age Score: 2 Gender Score: 1     ASSESSMENT AND PLAN: 1. Atrial flutter The patient's CHA2DS2-VASc score is 8, indicating a 10.8% annual risk of stroke.   Low burden on monitor, < 2 minutes and rate controlled. Recent CVA felt to be an Eliquis failure. ? COVID infection contributing.  Continue Pradaxa 150 mg BID. ASA stopped per neuro.  Continue metoprolol 50 mg BID (changed from carvedilol during admission)  2. Secondary Hypercoagulable State (ICD10:  D68.69) The patient is at significant risk for stroke/thromboembolism based upon her CHA2DS2-VASc Score of 8.  Continue Apixaban (Eliquis).   3. Obesity Body mass index is 33.98 kg/m. Lifestyle modification was discussed and encouraged including regular physical activity and weight reduction.  4. HTN Stable, no changes today.  5. 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 Will have her establish with EP.    Follow up with EP to establish care.    [facility] [address] [place], [place] [address] [phone] 02/17/2021 3:52 PM

## 2021-02-17 NOTE — Progress Notes (Signed)
PRESTYN, MAHN ([id number]) Visit Report for 02/17/2021 Fall Risk Assessment Details Patient Name: Date of Service: TYLASIA, FLETCHALL 02/17/2021 12:30 PM Medical Record Number: [id number] Patient Account Number: [id number] Date of Birth/Sex: Treating RN: 1940-08-23 (80 y.o. Elam Dutch Primary Care Peter Keyworth: Anthonette Legato, LEE Other Clinician: Referring Djuan Talton: Treating Evamarie Raetz/Extender: Linton Ham MO RGA N, RUFFIN Weeks in Treatment: 16 Fall Risk Assessment Items Have you had 2 or more falls in the last 12 monthso 0 No Have you had any fall that resulted in injury in the last 12 monthso 0 No FALLS RISK SCREEN History of falling - immediate or within 3 months 25 Yes Secondary diagnosis (Do you have 2 or more medical diagnoseso) 0 No Ambulatory aid None/bed rest/wheelchair/nurse 0 Yes Crutches/cane/walker 0 No Furniture 0 No Intravenous therapy Access/Saline/Heparin Lock 0 No Gait/Transferring Normal/ bed rest/ wheelchair 0 Yes Weak (short steps with or without shuffle, stooped but able to lift head while walking, may seek 10 Yes support from furniture) Impaired (short steps with shuffle, may have difficulty arising from chair, head down, impaired 0 No balance) Mental Status Oriented to own ability 0 Yes Electronic Signature(s) Signed: 02/17/2021 6:41:14 PM By: Baruch Gouty RN, BSN Entered By: Baruch Gouty on 02/17/2021 13:04:02

## 2021-02-21 ENCOUNTER — Encounter (HOSPITAL_COMMUNITY): Payer: Self-pay | Admitting: Emergency Medicine

## 2021-02-21 ENCOUNTER — Inpatient Hospital Stay (HOSPITAL_COMMUNITY)
Admission: EM | Admit: 2021-02-21 | Discharge: 2021-03-01 | DRG: 064 | Disposition: A | Discharge status: Hospice | Payer: Medicare PPO | Attending: Student | Admitting: Student

## 2021-02-21 ENCOUNTER — Emergency Department (HOSPITAL_COMMUNITY): Payer: Medicare PPO

## 2021-02-21 ENCOUNTER — Other Ambulatory Visit: Payer: Self-pay

## 2021-02-21 DIAGNOSIS — Z888 Allergy status to other drugs, medicaments and biological substances status: Secondary | ICD-10-CM

## 2021-02-21 DIAGNOSIS — Z79890 Hormone replacement therapy (postmenopausal): Secondary | ICD-10-CM

## 2021-02-21 DIAGNOSIS — Z6833 Body mass index (BMI) 33.0-33.9, adult: Secondary | ICD-10-CM | POA: Diagnosis not present

## 2021-02-21 DIAGNOSIS — I639 Cerebral infarction, unspecified: Secondary | ICD-10-CM

## 2021-02-21 DIAGNOSIS — Z885 Allergy status to narcotic agent status: Secondary | ICD-10-CM

## 2021-02-21 DIAGNOSIS — R4701 Aphasia: Secondary | ICD-10-CM | POA: Diagnosis present

## 2021-02-21 DIAGNOSIS — U071 COVID-19: Secondary | ICD-10-CM | POA: Diagnosis not present

## 2021-02-21 DIAGNOSIS — Z886 Allergy status to analgesic agent status: Secondary | ICD-10-CM

## 2021-02-21 DIAGNOSIS — I10 Essential (primary) hypertension: Secondary | ICD-10-CM | POA: Diagnosis not present

## 2021-02-21 DIAGNOSIS — Z8616 Personal history of COVID-19: Secondary | ICD-10-CM

## 2021-02-21 DIAGNOSIS — D631 Anemia in chronic kidney disease: Secondary | ICD-10-CM | POA: Diagnosis present

## 2021-02-21 DIAGNOSIS — Z66 Do not resuscitate: Secondary | ICD-10-CM | POA: Diagnosis not present

## 2021-02-21 DIAGNOSIS — R1310 Dysphagia, unspecified: Secondary | ICD-10-CM | POA: Diagnosis present

## 2021-02-21 DIAGNOSIS — I63512 Cerebral infarction due to unspecified occlusion or stenosis of left middle cerebral artery: Secondary | ICD-10-CM | POA: Diagnosis present

## 2021-02-21 DIAGNOSIS — E039 Hypothyroidism, unspecified: Secondary | ICD-10-CM | POA: Diagnosis present

## 2021-02-21 DIAGNOSIS — R7989 Other specified abnormal findings of blood chemistry: Secondary | ICD-10-CM | POA: Diagnosis present

## 2021-02-21 DIAGNOSIS — R3129 Other microscopic hematuria: Secondary | ICD-10-CM | POA: Diagnosis present

## 2021-02-21 DIAGNOSIS — R269 Unspecified abnormalities of gait and mobility: Secondary | ICD-10-CM | POA: Diagnosis present

## 2021-02-21 DIAGNOSIS — E785 Hyperlipidemia, unspecified: Secondary | ICD-10-CM | POA: Diagnosis present

## 2021-02-21 DIAGNOSIS — E669 Obesity, unspecified: Secondary | ICD-10-CM | POA: Diagnosis present

## 2021-02-21 DIAGNOSIS — Z85528 Personal history of other malignant neoplasm of kidney: Secondary | ICD-10-CM

## 2021-02-21 DIAGNOSIS — G9341 Metabolic encephalopathy: Secondary | ICD-10-CM | POA: Diagnosis present

## 2021-02-21 DIAGNOSIS — Z794 Long term (current) use of insulin: Secondary | ICD-10-CM

## 2021-02-21 DIAGNOSIS — R4189 Other symptoms and signs involving cognitive functions and awareness: Secondary | ICD-10-CM | POA: Diagnosis present

## 2021-02-21 DIAGNOSIS — E1165 Type 2 diabetes mellitus with hyperglycemia: Secondary | ICD-10-CM | POA: Diagnosis present

## 2021-02-21 DIAGNOSIS — Z515 Encounter for palliative care: Secondary | ICD-10-CM

## 2021-02-21 DIAGNOSIS — E1151 Type 2 diabetes mellitus with diabetic peripheral angiopathy without gangrene: Secondary | ICD-10-CM | POA: Diagnosis present

## 2021-02-21 DIAGNOSIS — L89620 Pressure ulcer of left heel, unstageable: Secondary | ICD-10-CM | POA: Diagnosis present

## 2021-02-21 DIAGNOSIS — Z90710 Acquired absence of both cervix and uterus: Secondary | ICD-10-CM

## 2021-02-21 DIAGNOSIS — I129 Hypertensive chronic kidney disease with stage 1 through stage 4 chronic kidney disease, or unspecified chronic kidney disease: Secondary | ICD-10-CM | POA: Diagnosis present

## 2021-02-21 DIAGNOSIS — Z79899 Other long term (current) drug therapy: Secondary | ICD-10-CM

## 2021-02-21 DIAGNOSIS — Z7189 Other specified counseling: Secondary | ICD-10-CM | POA: Diagnosis not present

## 2021-02-21 DIAGNOSIS — Z8673 Personal history of transient ischemic attack (TIA), and cerebral infarction without residual deficits: Secondary | ICD-10-CM

## 2021-02-21 DIAGNOSIS — D638 Anemia in other chronic diseases classified elsewhere: Secondary | ICD-10-CM | POA: Diagnosis not present

## 2021-02-21 DIAGNOSIS — N1832 Chronic kidney disease, stage 3b: Secondary | ICD-10-CM

## 2021-02-21 DIAGNOSIS — R5381 Other malaise: Secondary | ICD-10-CM | POA: Diagnosis not present

## 2021-02-21 DIAGNOSIS — E1122 Type 2 diabetes mellitus with diabetic chronic kidney disease: Secondary | ICD-10-CM | POA: Diagnosis present

## 2021-02-21 DIAGNOSIS — I4892 Unspecified atrial flutter: Secondary | ICD-10-CM | POA: Diagnosis present

## 2021-02-21 DIAGNOSIS — I6389 Other cerebral infarction: Secondary | ICD-10-CM | POA: Diagnosis not present

## 2021-02-21 DIAGNOSIS — Z7902 Long term (current) use of antithrombotics/antiplatelets: Secondary | ICD-10-CM

## 2021-02-21 DIAGNOSIS — Z905 Acquired absence of kidney: Secondary | ICD-10-CM

## 2021-02-21 LAB — URINALYSIS, ROUTINE W REFLEX MICROSCOPIC
Bacteria, UA: NONE SEEN
Bilirubin Urine: NEGATIVE
Glucose, UA: >=500 mg/dL — AB
Ketones, ur: NEGATIVE mg/dL
Leukocytes,Ua: NEGATIVE
Nitrite: NEGATIVE
Protein, ur: 100 mg/dL — AB
Specific Gravity, Urine: 1.011 (ref 1.005–1.030)
pH: 6 (ref 5.0–8.0)

## 2021-02-21 LAB — CBC
HCT: 34.7 % — ABNORMAL LOW (ref 36.0–46.0)
Hemoglobin: 11.4 g/dL — ABNORMAL LOW (ref 12.0–15.0)
MCH: 29.4 pg (ref 26.0–34.0)
MCHC: 32.9 g/dL (ref 30.0–36.0)
MCV: 89.4 fL (ref 80.0–100.0)
Platelets: 244 10*3/uL (ref 150–400)
RBC: 3.88 MIL/uL (ref 3.87–5.11)
RDW: 12.1 % (ref 11.5–15.5)
WBC: 11.1 10*3/uL — ABNORMAL HIGH (ref 4.0–10.5)
nRBC: 0 % (ref 0.0–0.2)

## 2021-02-21 LAB — DIFFERENTIAL
Abs Immature Granulocytes: 0.04 10*3/uL (ref 0.00–0.07)
Basophils Absolute: 0.1 10*3/uL (ref 0.0–0.1)
Basophils Relative: 1 %
Eosinophils Absolute: 0.2 10*3/uL (ref 0.0–0.5)
Eosinophils Relative: 2 %
Immature Granulocytes: 0 %
Lymphocytes Relative: 28 %
Lymphs Abs: 3.1 10*3/uL (ref 0.7–4.0)
Monocytes Absolute: 0.8 10*3/uL (ref 0.1–1.0)
Monocytes Relative: 8 %
Neutro Abs: 6.8 10*3/uL (ref 1.7–7.7)
Neutrophils Relative %: 61 %

## 2021-02-21 LAB — I-STAT CHEM 8, ED
BUN: 27 mg/dL — ABNORMAL HIGH (ref 8–23)
Calcium, Ion: 1.19 mmol/L (ref 1.15–1.40)
Chloride: 101 mmol/L (ref 98–111)
Creatinine, Ser: 1.5 mg/dL — ABNORMAL HIGH (ref 0.44–1.00)
Glucose, Bld: 283 mg/dL — ABNORMAL HIGH (ref 70–99)
HCT: 35 % — ABNORMAL LOW (ref 36.0–46.0)
Hemoglobin: 11.9 g/dL — ABNORMAL LOW (ref 12.0–15.0)
Potassium: 3.4 mmol/L — ABNORMAL LOW (ref 3.5–5.1)
Sodium: 135 mmol/L (ref 135–145)
TCO2: 22 mmol/L (ref 22–32)

## 2021-02-21 LAB — COMPREHENSIVE METABOLIC PANEL
ALT: 18 U/L (ref 0–44)
AST: 20 U/L (ref 15–41)
Albumin: 3 g/dL — ABNORMAL LOW (ref 3.5–5.0)
Alkaline Phosphatase: 64 U/L (ref 38–126)
Anion gap: 11 (ref 5–15)
BUN: 26 mg/dL — ABNORMAL HIGH (ref 8–23)
CO2: 22 mmol/L (ref 22–32)
Calcium: 10.1 mg/dL (ref 8.9–10.3)
Chloride: 101 mmol/L (ref 98–111)
Creatinine, Ser: 1.53 mg/dL — ABNORMAL HIGH (ref 0.44–1.00)
GFR, Estimated: 34 mL/min — ABNORMAL LOW (ref >60)
Glucose, Bld: 294 mg/dL — ABNORMAL HIGH (ref 70–99)
Potassium: 3.4 mmol/L — ABNORMAL LOW (ref 3.5–5.1)
Sodium: 134 mmol/L — ABNORMAL LOW (ref 135–145)
Total Bilirubin: 0.6 mg/dL (ref 0.3–1.2)
Total Protein: 6.4 g/dL — ABNORMAL LOW (ref 6.5–8.1)

## 2021-02-21 LAB — PROTIME-INR
INR: 1.7 — ABNORMAL HIGH (ref 0.8–1.2)
Prothrombin Time: 19.9 seconds — ABNORMAL HIGH (ref 11.4–15.2)

## 2021-02-21 LAB — CBG MONITORING, ED: Glucose-Capillary: 285 mg/dL — ABNORMAL HIGH (ref 70–99)

## 2021-02-21 LAB — APTT: aPTT: 58 seconds — ABNORMAL HIGH (ref 24–36)

## 2021-02-21 MED ORDER — IOHEXOL 350 MG/ML SOLN
50.0000 mL | Freq: Once | INTRAVENOUS | Status: AC | PRN
  Administered 2021-02-21: 50 mL via INTRAVENOUS

## 2021-02-21 MED ORDER — SODIUM CHLORIDE 0.9% FLUSH
3.0000 mL | Freq: Once | INTRAVENOUS | Status: AC
Start: 2021-02-21 — End: 2021-02-22
  Administered 2021-02-22: 3 mL via INTRAVENOUS

## 2021-02-21 NOTE — ED Provider Notes (Signed)
[facility] EMERGENCY DEPARTMENT Provider Note   CSN: [id number] Arrival date & time: 02/21/21  2047  An emergency department physician performed an initial assessment on this suspected stroke patient at 2048.  History Chief Complaint  Patient presents with   Code Stroke    Dorothy Barker is a 80 y.o. female history of CKD, diabetes, hypertension, a flutter on Pradaxa, here presenting with aphasia.  Patient apparently became aphasic around 7 PM.  Family talked to her this evening around 6:30 PM and she was normal at that time.  She is currently getting treatment for UTI right now.  Code stroke activated by EMS.  Patient recently had a stroke and is on Pradaxa.  The history is provided by the patient.      Past Medical History:  Diagnosis Date   Cancer ([place])    kidney   Chronic kidney disease    Chronic kidney disease, stage 3b ([place]) 02/06/2021   Diabetes mellitus without complication ([place])    Essential hypertension 02/06/2021   Hyperlipidemia    Hypertension    Hypothyroidism 02/06/2021   Paroxysmal atrial flutter ([place]) 02/06/2021   Stroke ([place])    Thyroid disease    Type 2 diabetes mellitus with stage 3b chronic kidney disease, with long-term current use of insulin ([place]) 02/06/2021   Vitamin D deficiency     Patient Active Problem List   Diagnosis Date Noted   Pressure injury of skin 02/08/2021   Stroke ([place]) 02/08/2021   COVID-19 virus infection 02/07/2021   Left upper lobe pulmonary nodule 02/07/2021   Acute ischemic stroke ([place]) 02/06/2021   Fall at home, initial encounter 02/06/2021   Essential hypertension 02/06/2021   Hypothyroidism 02/06/2021   Paroxysmal atrial flutter ([place]) 02/06/2021   Type 2 diabetes mellitus with stage 3b chronic kidney disease, with long-term current use of insulin ([place]) 02/06/2021   Hypokalemia 02/06/2021   Chronic kidney disease, stage 3b ([place]) 02/06/2021   Atrial flutter ([place]) 01/27/2021   Secondary hypercoagulable state  ([place]) 01/27/2021   Decubitus ulcer of heel, left, unstageable ([place]) 10/18/2020   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   Venous insufficiency of both lower extremities 10/18/2020   Fall 09/14/2020   Renal cyst, right 07/26/2020   Numbness and tingling of foot 07/02/2017   Type 2 diabetes mellitus with diabetic neuropathy, without long-term current use of insulin ([place]) 07/02/2017   Onychomycosis due to dermatophyte 12/29/2016   Other acquired hammer toe 01/03/2016   Essential hypertension 03/03/2008   Hypothyroidism 03/03/2008    Past Surgical History:  Procedure Laterality Date   ABDOMINAL AORTOGRAM W/LOWER EXTREMITY N/A 12/01/2020   Procedure: ABDOMINAL AORTOGRAM W/LOWER EXTREMITY;  Surgeon: Cherre Robins, MD;  Location: [facility];  Service: Cardiovascular;  Laterality: N/A;   ABDOMINAL HYSTERECTOMY     KNEE SURGERY Right    NEPHRECTOMY Left    PERIPHERAL VASCULAR INTERVENTION Left 12/01/2020   Procedure: PERIPHERAL VASCULAR INTERVENTION;  Surgeon: Cherre Robins, MD;  Location: [facility];  Service: Cardiovascular;  Laterality: Left;   TONSILLECTOMY       OB History   No obstetric history on file.     Family History  Problem Relation Age of Onset   Heart disease Neg Hx     Social History   Tobacco Use   Smoking status: Never    Passive exposure: Yes   Smokeless tobacco: Never  Vaping Use   Vaping Use: Never used  Substance Use Topics  Alcohol use: Never   Drug use: Never    Home Medications Prior to Admission medications   Medication Sig Start Date End Date Taking? Authorizing Provider  acetaminophen (TYLENOL) 500 MG tablet Take 1,000 mg by mouth every 8 (eight) hours as needed for mild pain or moderate pain.    [provider]  atorvastatin (LIPITOR) 40 MG tablet Take 40 mg by mouth at bedtime. 10/11/20   [provider]  dabigatran (PRADAXA) 150 MG CAPS capsule Take 1 capsule (150 mg total) by mouth every 12  (twelve) hours. 02/08/21   Thurnell Lose, MD  Dulaglutide (TRULICITY) 1.5 WE/3.1VQ SOPN Inject 1.5 mg into the skin every Thursday.    [provider]  hydrALAZINE (APRESOLINE) 10 MG tablet Take 10 mg by mouth 4 (four) times daily. 8am, 12p, 4p, 8p (hold for SBP <100)    [provider]  insulin glargine (LANTUS) 100 unit/mL SOPN Inject 26 Units into the skin at bedtime.    [provider]  lisinopril-hydrochlorothiazide (ZESTORETIC) 20-25 MG tablet Take 1 tablet by mouth every morning.    [provider]  metoprolol tartrate (LOPRESSOR) 50 MG tablet Take 1 tablet (50 mg total) by mouth 2 (two) times daily. 02/08/21   Thurnell Lose, MD  pantoprazole (PROTONIX) 40 MG tablet Take 1 tablet (40 mg total) by mouth daily. 02/09/21   Thurnell Lose, MD  thyroid (ARMOUR) 60 MG tablet Take 60 mg by mouth every morning.    [provider]  Vitamin D, Ergocalciferol, (DRISDOL) 1.25 MG (50000 UNIT) CAPS capsule Take 50,000 Units by mouth every Thursday.    [provider]    Allergies    Amlodipine, Codeine, Lotensin [benazepril], Lotrel [amlodipine besy-benazepril hcl], Naproxen, Tramadol, Tramadol, Amlodipine besy-benazepril hcl, Codeine, and Naproxen  Review of Systems   Review of Systems  Neurological:  Positive for speech difficulty.  Psychiatric/Behavioral:         Trouble speaking   All other systems reviewed and are negative.  Physical Exam Updated Vital Signs BP (!) 179/127 (BP Location: Right Arm)   Pulse 91   Resp 13   Ht 5' (1.524 m)   Wt 78.9 kg   SpO2 97%   BMI 33.97 kg/m   Physical Exam Vitals and nursing note reviewed.  Constitutional:      Comments: Expressive aphasia   HENT:     Head: Normocephalic.     Mouth/Throat:     Mouth: Mucous membranes are moist.  Eyes:     Extraocular Movements: Extraocular movements intact.     Pupils: Pupils are equal, round, and reactive to light.  Cardiovascular:     Rate and  Rhythm: Normal rate and regular rhythm.     Pulses: Normal pulses.     Heart sounds: Normal heart sounds.  Pulmonary:     Effort: Pulmonary effort is normal.     Breath sounds: Normal breath sounds.  Abdominal:     General: Abdomen is flat.     Palpations: Abdomen is soft.  Musculoskeletal:        General: Normal range of motion.     Cervical back: Normal range of motion and neck supple.  Skin:    General: Skin is warm.     Capillary Refill: Capillary refill takes less than 2 seconds.  Neurological:     Comments: Strength is 3/5 bilateral legs, 4/5 bilateral arms, + expressive aphasia     ED Results / Procedures / Treatments   Labs (  all labs ordered are listed, but only abnormal results are displayed) Labs Reviewed  PROTIME-INR - Abnormal; Notable for the following components:      Result Value   Prothrombin Time 19.9 (*)    INR 1.7 (*)    All other components within normal limits  APTT - Abnormal; Notable for the following components:   aPTT 58 (*)    All other components within normal limits  CBC - Abnormal; Notable for the following components:   WBC 11.1 (*)    Hemoglobin 11.4 (*)    HCT 34.7 (*)    All other components within normal limits  COMPREHENSIVE METABOLIC PANEL - Abnormal; Notable for the following components:   Sodium 134 (*)    Potassium 3.4 (*)    Glucose, Bld 294 (*)    BUN 26 (*)    Creatinine, Ser 1.53 (*)    Total Protein 6.4 (*)    Albumin 3.0 (*)    GFR, Estimated 34 (*)    All other components within normal limits  URINALYSIS, ROUTINE W REFLEX MICROSCOPIC - Abnormal; Notable for the following components:   Color, Urine STRAW (*)    Glucose, UA >=500 (*)    Hgb urine dipstick MODERATE (*)    Protein, ur 100 (*)    All other components within normal limits  I-STAT CHEM 8, ED - Abnormal; Notable for the following components:   Potassium 3.4 (*)    BUN 27 (*)    Creatinine, Ser 1.50 (*)    Glucose, Bld 283 (*)    Hemoglobin 11.9 (*)    HCT  35.0 (*)    All other components within normal limits  CBG MONITORING, ED - Abnormal; Notable for the following components:   Glucose-Capillary 285 (*)    All other components within normal limits  URINE CULTURE  DIFFERENTIAL  AMMONIA  FOLATE  ETHANOL  TSH  VITAMIN B12  METHYLMALONIC ACID, SERUM    EKG None  Radiology CT HEAD CODE STROKE WO CONTRAST  Result Date: 02/21/2021 CLINICAL DATA:  Code stroke.  Aphasia EXAM: CT HEAD WITHOUT CONTRAST TECHNIQUE: Contiguous axial images were obtained from the base of the skull through the vertex without intravenous contrast. COMPARISON:  02/07/2021 FINDINGS: Brain: There is no mass, hemorrhage or extra-axial collection. There is generalized atrophy without lobar predilection. There old infarcts of left cerebellum right basal. There is periventricular hypoattenuation compatible with chronic microvascular disease. No acute cortical infarct is evident. Vascular: Atherosclerotic calcification of the internal carotid arteries at the skull base. No abnormal hyperdensity of the major intracranial arteries or dural venous sinuses. Skull: The visualized skull base, calvarium and extracranial soft tissues are normal. Sinuses/Orbits: No fluid levels or advanced mucosal thickening of the visualized paranasal sinuses. No mastoid or middle ear effusion. Left maxillary retention cyst. The orbits are normal. ASPECTS ([place] Stroke Program Early CT Score) - Ganglionic level infarction (caudate, lentiform nuclei, internal capsule, insula, M1-M3 cortex): 7 - Supraganglionic infarction (M4-M6 cortex): 3 Total score (0-10 with 10 being normal): 10 IMPRESSION: 1. No acute intracranial abnormality. 2. ASPECTS is 10. 3. Old infarcts of the left cerebellum and right basal ganglia. These results were communicated to Dr. Donnetta Simpers at 9:07 pm on 02/21/2021 by text page via the [facility] messaging system. Electronically Signed   By: Ulyses Jarred M.D.   On: 02/21/2021 21:07   CT  ANGIO HEAD CODE STROKE  Result Date: 02/21/2021 CLINICAL DATA:  Aphasia EXAM: CT ANGIOGRAPHY HEAD AND NECK TECHNIQUE: Multidetector  CT imaging of the head and neck was performed using the standard protocol during bolus administration of intravenous contrast. Multiplanar CT image reconstructions and MIPs were obtained to evaluate the vascular anatomy. Carotid stenosis measurements (when applicable) are obtained utilizing NASCET criteria, using the distal internal carotid diameter as the denominator. CONTRAST:  70mL OMNIPAQUE IOHEXOL 350 MG/ML SOLN COMPARISON:  Head CT 02/21/2021 CTA head neck 02/07/2021 FINDINGS: CTA NECK FINDINGS SKELETON: There is no bony spinal canal stenosis. No lytic or blastic lesion. OTHER NECK: Normal pharynx, larynx and major salivary glands. No cervical lymphadenopathy. Unremarkable thyroid gland. UPPER CHEST: No pneumothorax or pleural effusion. No nodules or masses. AORTIC ARCH: There is calcific atherosclerosis of the aortic arch. There is no aneurysm, dissection or hemodynamically significant stenosis of the visualized portion of the aorta. Conventional 3 vessel aortic branching pattern. The visualized proximal subclavian arteries are widely patent. RIGHT CAROTID SYSTEM: No dissection, occlusion or aneurysm. Mild atherosclerotic calcification at the carotid bifurcation without hemodynamically significant stenosis. LEFT CAROTID SYSTEM: No dissection, occlusion or aneurysm. Mild atherosclerotic calcification at the carotid bifurcation without hemodynamically significant stenosis. VERTEBRAL ARTERIES: Left dominant configuration. Both origins are clearly patent. There is no dissection, occlusion or flow-limiting stenosis to the skull base (V1-V3 segments). CTA HEAD FINDINGS POSTERIOR CIRCULATION: --Vertebral arteries: Normal V4 segments. --Inferior cerebellar arteries: Normal. --Basilar artery: Normal. --Superior cerebellar arteries: Normal. --Posterior cerebral arteries (PCA): Severe  stenosis of the left PCA P2 segment, unchanged. ANTERIOR CIRCULATION: --Intracranial internal carotid arteries: Normal. --Anterior cerebral arteries (ACA): Normal. Both A1 segments are present. Patent anterior communicating artery (a-comm). --Middle cerebral arteries (MCA): There is severe stenosis of the left mid M1 segment and proximal left M2 segment, which is slightly progressed since 02/07/2021. There is moderate-to-severe stenosis of the distal right M1 segment, unchanged. No occlusion. VENOUS SINUSES: As permitted by contrast timing, patent. ANATOMIC VARIANTS: None Review of the MIP images confirms the above findings. IMPRESSION: 1. No emergent large vessel occlusion. 2. Mild progression of severe stenosis of the left MCA mid M1 segment and proximal M2 segment. 3. Unchanged moderate-to-severe stenosis of the distal right M1 segment. 4. Unchanged severe stenosis of the left PCA P2 segment. Aortic Atherosclerosis (ICD10-I70.0). Electronically Signed   By: Ulyses Jarred M.D.   On: 02/21/2021 21:21   CT ANGIO NECK CODE STROKE  Result Date: 02/21/2021 CLINICAL DATA:  Aphasia EXAM: CT ANGIOGRAPHY HEAD AND NECK TECHNIQUE: Multidetector CT imaging of the head and neck was performed using the standard protocol during bolus administration of intravenous contrast. Multiplanar CT image reconstructions and MIPs were obtained to evaluate the vascular anatomy. Carotid stenosis measurements (when applicable) are obtained utilizing NASCET criteria, using the distal internal carotid diameter as the denominator. CONTRAST:  98mL OMNIPAQUE IOHEXOL 350 MG/ML SOLN COMPARISON:  Head CT 02/21/2021 CTA head neck 02/07/2021 FINDINGS: CTA NECK FINDINGS SKELETON: There is no bony spinal canal stenosis. No lytic or blastic lesion. OTHER NECK: Normal pharynx, larynx and major salivary glands. No cervical lymphadenopathy. Unremarkable thyroid gland. UPPER CHEST: No pneumothorax or pleural effusion. No nodules or masses. AORTIC ARCH:  There is calcific atherosclerosis of the aortic arch. There is no aneurysm, dissection or hemodynamically significant stenosis of the visualized portion of the aorta. Conventional 3 vessel aortic branching pattern. The visualized proximal subclavian arteries are widely patent. RIGHT CAROTID SYSTEM: No dissection, occlusion or aneurysm. Mild atherosclerotic calcification at the carotid bifurcation without hemodynamically significant stenosis. LEFT CAROTID SYSTEM: No dissection, occlusion or aneurysm. Mild atherosclerotic calcification at the carotid bifurcation without hemodynamically  significant stenosis. VERTEBRAL ARTERIES: Left dominant configuration. Both origins are clearly patent. There is no dissection, occlusion or flow-limiting stenosis to the skull base (V1-V3 segments). CTA HEAD FINDINGS POSTERIOR CIRCULATION: --Vertebral arteries: Normal V4 segments. --Inferior cerebellar arteries: Normal. --Basilar artery: Normal. --Superior cerebellar arteries: Normal. --Posterior cerebral arteries (PCA): Severe stenosis of the left PCA P2 segment, unchanged. ANTERIOR CIRCULATION: --Intracranial internal carotid arteries: Normal. --Anterior cerebral arteries (ACA): Normal. Both A1 segments are present. Patent anterior communicating artery (a-comm). --Middle cerebral arteries (MCA): There is severe stenosis of the left mid M1 segment and proximal left M2 segment, which is slightly progressed since 02/07/2021. There is moderate-to-severe stenosis of the distal right M1 segment, unchanged. No occlusion. VENOUS SINUSES: As permitted by contrast timing, patent. ANATOMIC VARIANTS: None Review of the MIP images confirms the above findings. IMPRESSION: 1. No emergent large vessel occlusion. 2. Mild progression of severe stenosis of the left MCA mid M1 segment and proximal M2 segment. 3. Unchanged moderate-to-severe stenosis of the distal right M1 segment. 4. Unchanged severe stenosis of the left PCA P2 segment. Aortic  Atherosclerosis (ICD10-I70.0). Electronically Signed   By: Ulyses Jarred M.D.   On: 02/21/2021 21:21    Procedures Procedures   Medications Ordered in ED Medications  sodium chloride flush (NS) 0.9 % injection 3 mL (has no administration in time range)  iohexol (OMNIPAQUE) 350 MG/ML injection 50 mL (50 mLs Intravenous Contrast Given 02/21/21 2113)    ED Course  I have reviewed the triage vital signs and the nursing notes.  Pertinent labs & imaging results that were available during my care of the patient were reviewed by me and considered in my medical decision making (see chart for details).    MDM Rules/Calculators/A&P                          Dorothy Barker is a 80 y.o. female here with aphasia.  Patient had expressive aphasia started around 7 PM. Patient is still aphasic on arrival.  She is already on Pradaxa.  Plan to get CT head and CTA head.  Neurology saw patient on arrival as patient is a code stroke  11:24 PM Patient CT and CTA unchanged.  MRI brain showed acute stroke. Dr. Lorrin Goodell saw patient. Agreed with admission. Hospitalist to admit.  Labs otherwise unremarkable.  Neurology will follow up with patient tomorrow.   Final Clinical Impression(s) / ED Diagnoses Final diagnoses:  None    Rx / DC Orders ED Discharge Orders     None        Drenda Freeze, MD 02/21/21 2325

## 2021-02-21 NOTE — ED Triage Notes (Signed)
Pt was last seen normal by staff at facility at 1830.  Pt noted by family to be aphasic at 4.  EMS called out.  Pt arrived EMS to bridge as code stroke.  Pt in NAD at time of arrival.

## 2021-02-21 NOTE — Code Documentation (Addendum)
Responded to Code Stroke called at 2027 for aphasia, [id number]. Per EMS, family noticed pt had AMS, would not follow commands, and had slurred speech. Pt arrived at 2047, [id number], NIH-12, CT head negative for acute changes, CTA-no LVO. Plan for MRI and to work-up for metabolic causes.

## 2021-02-21 NOTE — Consult Note (Addendum)
NEUROLOGY CONSULTATION NOTE   Date of service: February 21, 2021 Patient Name: Dorothy Barker MRN:  [id number] DOB:  September 22, 1940 Reason for consult: "Stroke code for concern for aphasia out of proportion to AMS" Requesting Provider: Drenda Freeze, MD _ _ _   _ __   _ __ _ _  __ __   _ __   __ _  History of Present Illness  [name] is a 80 y.o. 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  She was last seen at her facilty at 1830 on 02/21/21 and then found confused and aphasic with some slurred speech. She was unable to follow any commands and speech was mostly word salad per EMS.  On arrival to the ED, appears confused. Looks around and will track face on the left and right. Spontaneously moving BL upper extremities.  Per daughter has been in assisted living for 5-6 months, cognitively doing good. Daugther sees her everyday and describes her as a social butterfly. She is being treated for a UTI for the last 3 days but seemd at her baseline this afternoon. She was at a birthday gathering at around 3pm. She is in a wheelchair and she spoke to daughter after she was back from he birthday party.  She was last seen normal by her aid at around 1830. Reported feeling tired and wanted to go to bed at 2000. Around 1900, she was confused, not talking and could not recall her daughter's name.  Daughter did notice that around 1600 while she was talking to her on the phone, she appeared to have some slurring of her speech that was intermittent. Did not think too muc habout it as it would go away.  No prior hx of seizures, no EtOH intake.  She was started on pradaxa and taken off eliquis and also started on Lowpressor about 2 weeks ago.  mRS: 4, in a  wheelchair. tPA: no, patient is on pradaxa, recent stroke on 02/07/21. LKW: 5456 on 02/21/21. Thrombectomy: Not offered. No LVO. She does have some progression of the noted multifocal multivessel stenosis. Particularly of the L MCA but good flow distally.   NIHSS components Score: Comment  1a Level of Conscious 0[x]  1[]  2[]  3[]      1b LOC Questions 0[]  1[]  2[x]       1c LOC Commands 0[]  1[]  2[x]       2 Best Gaze 0[x]  1[]  2[]       3 Visual 0[x]  1[]  2[]  3[]      4 Facial Palsy 0[x]  1[]  2[]  3[]      5a Motor Arm - left 0[x]  1[]  2[]  3[]  4[]  UN[]    5b Motor Arm - Right 0[x]  1[]  2[]  3[]  4[]  UN[]    6a Motor Leg - Left 0[]  1[]  2[]  3[x]  4[]  UN[]    6b Motor Leg - Right 0[]  1[]  2[]  3[x]  4[]  UN[]    7 Limb Ataxia 0[x]  1[]  2[]  3[]  UN[]     8 Sensory 0[x]  1[]  2[]  UN[]      9 Best Language 0[]  1[]  2[x]  3[]      10 Dysarthria 0[x]  1[]  2[]  UN[]      11 Extinct. and Inattention 0[x]  1[]  2[]       TOTAL: 12      ROS   Unable to  obtain ROS due to encephalopathy.  Past History   Past Medical History:  Diagnosis Date  . Cancer ([place])    kidney  . Chronic kidney disease   . Chronic kidney disease, stage 3b ([place]) 02/06/2021  . Diabetes mellitus without complication ([place])   . Essential hypertension 02/06/2021  . Hyperlipidemia   . Hypertension   . Hypothyroidism 02/06/2021  . Paroxysmal atrial flutter ([place]) 02/06/2021  . Stroke ([place])   . Thyroid disease   . Type 2 diabetes mellitus with stage 3b chronic kidney disease, with long-term current use of insulin (Dorothy Barker) 02/06/2021  . Vitamin D deficiency    Past Surgical History:  Procedure Laterality Date  . ABDOMINAL AORTOGRAM W/LOWER EXTREMITY N/A 12/01/2020   Procedure: ABDOMINAL AORTOGRAM W/LOWER EXTREMITY;  Surgeon: Cherre Robins, MD;  Location: [facility];  Service: Cardiovascular;  Laterality: N/A;  . ABDOMINAL HYSTERECTOMY    . KNEE SURGERY Right   . NEPHRECTOMY Left   . PERIPHERAL VASCULAR INTERVENTION Left 12/01/2020   Procedure: PERIPHERAL  VASCULAR INTERVENTION;  Surgeon: Cherre Robins, MD;  Location: [facility];  Service: Cardiovascular;  Laterality: Left;  . TONSILLECTOMY     Family History  Problem Relation Age of Onset  . Heart disease Neg Hx    Social History   Socioeconomic History  . Marital status: Single    Spouse name: Not on file  . Number of children: Not on file  . Years of education: Not on file  . Highest education level: Not on file  Occupational History  . Not on file  Tobacco Use  . Smoking status: Never    Passive exposure: Yes  . Smokeless tobacco: Never  Vaping Use  . Vaping Use: Never used  Substance and Sexual Activity  . Alcohol use: Never  . Drug use: Never  . Sexual activity: Not on file  Other Topics Concern  . Not on file  Social History Narrative   ** Merged History Encounter **       Resides at [facility] at [place], Assisted Living Right Handed Drinks 1 cups caffeine daily   Social Determinants of Health   Financial Resource Strain: Not on file  Food Insecurity: Not on file  Transportation Needs: Not on file  Physical Activity: Not on file  Stress: Not on file  Social Connections: Not on file   Allergies  Allergen Reactions  . Amlodipine Other (See Comments)    Unknown reaction  . Codeine Other (See Comments)    Unknown reaction  . Lotensin [Benazepril] Other (See Comments)    Unknown reaction  . Lotrel [Amlodipine Besy-Benazepril Hcl] Other (See Comments)    Unknown reaction  . Naproxen Other (See Comments)    Unknown reaction  . Tramadol Other (See Comments)    Syncopal episodes   . Tramadol Other (See Comments)    Unknown reaction  . Amlodipine Besy-Benazepril Hcl Rash  . Codeine Rash  . Naproxen Rash    Medications  (Not in a hospital admission)    Vitals   There were no vitals filed for this visit.   There is no height or weight on file to calculate BMI.  Physical Exam   General: Laying comfortably in bed; in no acute  distress. HENT: Normal oropharynx and mucosa. Normal external appearance of ears and nose. Neck: Supple, no pain or tendernes. CV: No JVD. No peripheral edema. Pulmonary: Symmetric Chest rise. Normal respiratory effort. Abdomen: Soft to touch, non-tender. Ext: No cyanosis, edema,  or deformity Skin: No rash. Normal palpation of skin.  Musculoskeletal: Normal digits and nails by inspection. No clubbing.  Neurologic Examination  Mental status/Cognition: Awake, unable to assess orientation due to encephalopathy. Speech/language: Does not answer questions or follow commands. When moved to the [place] from the EMS gurney. Did say "what is wrong with you". No dysarthria. Cranial nerves:   CN II Pupils equal and reactive to light, blinks to threat BL.   CN III,IV,VI Looks left and right, no gaze preference or deviation, no nystagmus   CN V    CN VII no asymmetry, no nasolabial fold flattening   CN VIII Turns head towards speech.   CN IX & X normal palatal elevation, no uvular deviation   CN XI 5/5 head turn and 5/5 shoulder shrug bilaterally   CN XII midline tongue protrusion   Motor:  Muscle bulk: normal, tone normal BL upper extremities drift down to the bed when held off gravity. Spontaneously moving BL upper extremities. Does withdraw RLE to babinski reflex and lifts it off the bed. LLE drifts down to the bed when held up in the air.  Reflexes:  Right Left Comments  Pectoralis      Biceps (C5/6) 2 2   Brachioradialis (C5/6) 2 2    Triceps (C6/7) 2 2    Patellar (L3/4) 2 2    Achilles (S1)      Hoffman      Plantar     Jaw jerk    Sensation: Grimaces to noxious stimuli in all extremities  Coordination/Complex Motor:  - Unable to assess but no obvious ataxia.  Labs   CBC: No results for input(s): WBC, NEUTROABS, HGB, HCT, MCV, PLT in the last 168 hours.  Basic Metabolic Panel:  Lab Results  Component Value Date   NA 137 02/08/2021   K 3.8 02/08/2021   CO2 25  02/08/2021   GLUCOSE 95 02/08/2021   BUN 30 (H) 02/08/2021   CREATININE 1.36 (H) 02/08/2021   CALCIUM 9.9 02/08/2021   GFRNONAA 39 (L) 02/08/2021   Lipid Panel:  Lab Results  Component Value Date   LDLCALC 57 02/07/2021   HgbA1c:  Lab Results  Component Value Date   HGBA1C 9.0 (H) 02/08/2021   Urine Drug Screen: No results found for: LABOPIA, COCAINSCRNUR, LABBENZ, AMPHETMU, THCU, LABBARB  Alcohol Level     Component Value Date/Time   ETH <10 02/06/2021 1919    CT Head without contrast(personally reviewed): CTH was negative for a large hypodensity concerning for a large territory infarct or hyperdensity concerning for an ICH  CT angio Head and Neck with contrast(personally reviewed): 1. No emergent large vessel occlusion. 2. Mild progression of severe stenosis of the left MCA mid M1 segment and proximal M2 segment. 3. Unchanged moderate-to-severe stenosis of the distal right M1 segment. 4. Unchanged severe stenosis of the left PCA P2 segment.  MRI Brain: Pending  Impression   Dorothy Barker is a 80 y.o. 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  Not a tPA candidate 2/2 dabigatran and recent large left cerebellar infarct, not a candidate for thrombectomy due to poor baseline and no emergent LVO. She does have known multifocal multivessel narrowing with mild progression of the  severe stenosis of the left MCA M1 and proximal M2.  Daughter reports that she is on antibiotics for a UTI for the last 3 days.  Primary Diagnosis:  Aphasia. Encphalopathy Being treated for a UTI.  Recommendations  - MRI Brain without contrast to rule out stroke - Metabolic/infectious workup per ED to evaluate  for potential UTI, electrolyte abnormalities. - Encephalopathy labs with B12, folate, TSH, Ammonia. ______________________________________________________________________   Update: MRI Brain demonstrated small acute infarct in the left posterior insula with scattered punctate foci of acute ischemia in posterior left MCA territory. Likely explains her presentation with aphasia.  Recs:  - Frequent Neuro checks per stroke unit protocol - Recommend obtaining Lipid panel with LDL - Please start statin if LDL > 70 - Recommend HbA1c - - Anticoagulation - Continue pradaxa for now. - SBP goal - permissive hypertension first 24 h < 220/110. Held home meds.  - Recommend Telemetry monitoring for arrythmia - Recommend bedside swallow screen prior to PO intake. - Stroke education booklet - Recommend PT/OT/SLP consult   Thank you for the opportunity to take part in the care of this patient. If you have any further questions, please contact the neurology consultation attending.  Signed,  [place] Pager Number [phone] _ _ _   _ __   _ __ _ _  __ __   _ __   __ _

## 2021-02-22 ENCOUNTER — Encounter (HOSPITAL_COMMUNITY): Payer: Self-pay | Admitting: Internal Medicine

## 2021-02-22 ENCOUNTER — Encounter (HOSPITAL_COMMUNITY): Payer: Medicare PPO

## 2021-02-22 DIAGNOSIS — E039 Hypothyroidism, unspecified: Secondary | ICD-10-CM | POA: Diagnosis not present

## 2021-02-22 DIAGNOSIS — I10 Essential (primary) hypertension: Secondary | ICD-10-CM | POA: Diagnosis not present

## 2021-02-22 DIAGNOSIS — I639 Cerebral infarction, unspecified: Secondary | ICD-10-CM | POA: Diagnosis not present

## 2021-02-22 DIAGNOSIS — N1832 Chronic kidney disease, stage 3b: Secondary | ICD-10-CM | POA: Diagnosis not present

## 2021-02-22 DIAGNOSIS — U071 COVID-19: Secondary | ICD-10-CM

## 2021-02-22 DIAGNOSIS — I4892 Unspecified atrial flutter: Secondary | ICD-10-CM

## 2021-02-22 DIAGNOSIS — E1122 Type 2 diabetes mellitus with diabetic chronic kidney disease: Secondary | ICD-10-CM

## 2021-02-22 DIAGNOSIS — Z794 Long term (current) use of insulin: Secondary | ICD-10-CM

## 2021-02-22 LAB — TSH: TSH: 1.686 u[IU]/mL (ref 0.350–4.500)

## 2021-02-22 LAB — COMPREHENSIVE METABOLIC PANEL
ALT: 15 U/L (ref 0–44)
AST: 19 U/L (ref 15–41)
Albumin: 3 g/dL — ABNORMAL LOW (ref 3.5–5.0)
Alkaline Phosphatase: 57 U/L (ref 38–126)
Anion gap: 9 (ref 5–15)
BUN: 23 mg/dL (ref 8–23)
CO2: 26 mmol/L (ref 22–32)
Calcium: 9.9 mg/dL (ref 8.9–10.3)
Chloride: 99 mmol/L (ref 98–111)
Creatinine, Ser: 1.28 mg/dL — ABNORMAL HIGH (ref 0.44–1.00)
GFR, Estimated: 42 mL/min — ABNORMAL LOW (ref >60)
Glucose, Bld: 206 mg/dL — ABNORMAL HIGH (ref 70–99)
Potassium: 3.5 mmol/L (ref 3.5–5.1)
Sodium: 134 mmol/L — ABNORMAL LOW (ref 135–145)
Total Bilirubin: 0.9 mg/dL (ref 0.3–1.2)
Total Protein: 5.8 g/dL — ABNORMAL LOW (ref 6.5–8.1)

## 2021-02-22 LAB — VITAMIN B12: Vitamin B-12: 308 pg/mL (ref 180–914)

## 2021-02-22 LAB — LIPID PANEL
Cholesterol: 126 mg/dL (ref 0–200)
HDL: 43 mg/dL (ref >40)
LDL Cholesterol: 56 mg/dL (ref 0–99)
Total CHOL/HDL Ratio: 2.9 RATIO
Triglycerides: 136 mg/dL (ref <150)
VLDL: 27 mg/dL (ref 0–40)

## 2021-02-22 LAB — CBC WITH DIFFERENTIAL/PLATELET
Abs Immature Granulocytes: 0.05 10*3/uL (ref 0.00–0.07)
Basophils Absolute: 0 10*3/uL (ref 0.0–0.1)
Basophils Relative: 0 %
Eosinophils Absolute: 0.2 10*3/uL (ref 0.0–0.5)
Eosinophils Relative: 2 %
HCT: 33.3 % — ABNORMAL LOW (ref 36.0–46.0)
Hemoglobin: 10.9 g/dL — ABNORMAL LOW (ref 12.0–15.0)
Immature Granulocytes: 1 %
Lymphocytes Relative: 23 %
Lymphs Abs: 2.1 10*3/uL (ref 0.7–4.0)
MCH: 29.4 pg (ref 26.0–34.0)
MCHC: 32.7 g/dL (ref 30.0–36.0)
MCV: 89.8 fL (ref 80.0–100.0)
Monocytes Absolute: 0.7 10*3/uL (ref 0.1–1.0)
Monocytes Relative: 8 %
Neutro Abs: 6.2 10*3/uL (ref 1.7–7.7)
Neutrophils Relative %: 66 %
Platelets: 221 10*3/uL (ref 150–400)
RBC: 3.71 MIL/uL — ABNORMAL LOW (ref 3.87–5.11)
RDW: 12 % (ref 11.5–15.5)
WBC: 9.3 10*3/uL (ref 4.0–10.5)
nRBC: 0 % (ref 0.0–0.2)

## 2021-02-22 LAB — AMMONIA: Ammonia: 21 umol/L (ref 9–35)

## 2021-02-22 LAB — GLUCOSE, CAPILLARY
Glucose-Capillary: 123 mg/dL — ABNORMAL HIGH (ref 70–99)
Glucose-Capillary: 128 mg/dL — ABNORMAL HIGH (ref 70–99)

## 2021-02-22 LAB — ETHANOL: Alcohol, Ethyl (B): <10 mg/dL (ref <10)

## 2021-02-22 LAB — SARS CORONAVIRUS 2 (TAT 6-24 HRS): SARS Coronavirus 2: POSITIVE — AB

## 2021-02-22 LAB — CBG MONITORING, ED
Glucose-Capillary: 249 mg/dL — ABNORMAL HIGH (ref 70–99)
Glucose-Capillary: 216 mg/dL — ABNORMAL HIGH (ref 70–99)
Glucose-Capillary: 107 mg/dL — ABNORMAL HIGH (ref 70–99)

## 2021-02-22 LAB — HEMOGLOBIN A1C
Hgb A1c MFr Bld: 8.4 % — ABNORMAL HIGH (ref 4.8–5.6)
Mean Plasma Glucose: 194.38 mg/dL

## 2021-02-22 LAB — FOLATE: Folate: 16.3 ng/mL (ref >5.9)

## 2021-02-22 MED ORDER — THYROID 60 MG PO TABS
60.0000 mg | ORAL_TABLET | Freq: Every day | ORAL | Status: DC
  Administered 2021-02-23 – 2021-02-24 (×2): 60 mg via ORAL
  Filled 2021-02-22 (×4): qty 1

## 2021-02-22 MED ORDER — INSULIN GLARGINE 100 UNIT/ML ~~LOC~~ SOLN
15.0000 [IU] | Freq: Every day | SUBCUTANEOUS | Status: DC
  Administered 2021-02-23: 15 [IU] via SUBCUTANEOUS
  Filled 2021-02-22 (×5): qty 0.15

## 2021-02-22 MED ORDER — METOPROLOL TARTRATE 50 MG PO TABS
50.0000 mg | ORAL_TABLET | Freq: Two times a day (BID) | ORAL | Status: DC
  Administered 2021-02-22 – 2021-02-25 (×5): 50 mg via ORAL
  Filled 2021-02-22 (×2): qty 1
  Filled 2021-02-22: qty 2
  Filled 2021-02-22 (×3): qty 1

## 2021-02-22 MED ORDER — INSULIN ASPART 100 UNIT/ML IJ SOLN
0.0000 [IU] | Freq: Three times a day (TID) | INTRAMUSCULAR | Status: DC
  Administered 2021-02-22: 5 [IU] via SUBCUTANEOUS

## 2021-02-22 MED ORDER — INSULIN GLARGINE 100 UNIT/ML ~~LOC~~ SOLN
26.0000 [IU] | Freq: Every day | SUBCUTANEOUS | Status: DC
  Filled 2021-02-22: qty 0.26

## 2021-02-22 MED ORDER — POLYETHYLENE GLYCOL 3350 17 G PO PACK: 17.0000 g | PACK | Freq: Every day | ORAL | Status: DC | PRN

## 2021-02-22 MED ORDER — ACETAMINOPHEN 325 MG PO TABS: 650.0000 mg | ORAL_TABLET | ORAL | Status: DC | PRN

## 2021-02-22 MED ORDER — HEPARIN (PORCINE) 25000 UT/250ML-% IV SOLN
750.0000 [IU]/h | INTRAVENOUS | Status: AC
  Administered 2021-02-22: 750 [IU]/h via INTRAVENOUS
  Filled 2021-02-22: qty 250

## 2021-02-22 MED ORDER — PANTOPRAZOLE SODIUM 40 MG IV SOLR
40.0000 mg | Freq: Every day | INTRAVENOUS | Status: DC
  Administered 2021-02-22 – 2021-02-26 (×4): 40 mg via INTRAVENOUS
  Filled 2021-02-22 (×4): qty 40

## 2021-02-22 MED ORDER — ACETAMINOPHEN 650 MG RE SUPP: 650.0000 mg | RECTAL | Status: DC | PRN

## 2021-02-22 MED ORDER — ONDANSETRON HCL 4 MG/2ML IJ SOLN: 4.0000 mg | Freq: Four times a day (QID) | INTRAMUSCULAR | Status: DC | PRN

## 2021-02-22 MED ORDER — INSULIN ASPART 100 UNIT/ML IJ SOLN
0.0000 [IU] | Freq: Three times a day (TID) | INTRAMUSCULAR | Status: DC
  Administered 2021-02-23 (×2): 2 [IU] via SUBCUTANEOUS
  Administered 2021-02-24 – 2021-02-26 (×6): 1 [IU] via SUBCUTANEOUS
  Administered 2021-02-26 – 2021-02-27 (×2): 2 [IU] via SUBCUTANEOUS

## 2021-02-22 MED ORDER — STROKE: EARLY STAGES OF RECOVERY BOOK: Freq: Once | Status: DC

## 2021-02-22 MED ORDER — INSULIN GLARGINE 100 UNIT/ML ~~LOC~~ SOLN
15.0000 [IU] | Freq: Once | SUBCUTANEOUS | Status: AC
  Administered 2021-02-22: 15 [IU] via SUBCUTANEOUS
  Filled 2021-02-22: qty 0.15

## 2021-02-22 MED ORDER — ASPIRIN EC 81 MG PO TBEC
81.0000 mg | DELAYED_RELEASE_TABLET | Freq: Every day | ORAL | Status: DC
  Administered 2021-02-23 – 2021-02-24 (×2): 81 mg via ORAL
  Filled 2021-02-22 (×3): qty 1

## 2021-02-22 MED ORDER — ACETAMINOPHEN 160 MG/5ML PO SOLN: 650.0000 mg | ORAL | Status: DC | PRN

## 2021-02-22 MED ORDER — ATORVASTATIN CALCIUM 40 MG PO TABS
40.0000 mg | ORAL_TABLET | Freq: Every day | ORAL | Status: DC
  Administered 2021-02-23: 40 mg via ORAL
  Filled 2021-02-22 (×2): qty 1

## 2021-02-22 MED ORDER — DABIGATRAN ETEXILATE MESYLATE 150 MG PO CAPS
150.0000 mg | ORAL_CAPSULE | Freq: Two times a day (BID) | ORAL | Status: DC
  Administered 2021-02-22: 150 mg via ORAL
  Filled 2021-02-22 (×2): qty 1

## 2021-02-22 MED ORDER — PANTOPRAZOLE SODIUM 40 MG PO TBEC
40.0000 mg | DELAYED_RELEASE_TABLET | Freq: Every day | ORAL | Status: DC
  Administered 2021-02-22: 40 mg via ORAL
  Filled 2021-02-22: qty 1

## 2021-02-22 NOTE — Progress Notes (Signed)
Patient arrived from ED via stretcher; she is sleepy but arouses with tactal stimulation; not answering questions or following commands; daughter is present at bedside; patient's voice is clear; she is moving all extremities to being touch; vital signs stable; oriented to room and unit routine.

## 2021-02-22 NOTE — Progress Notes (Addendum)
Dorothy Barker, NO ([id number]) Visit Report for 02/17/2021 Arrival Information Details Patient Name: Date of Service: Dorothy Barker, Dorothy Barker 02/17/2021 12:30 PM Medical Record Number: [id number] Patient Account Number: [id number] Date of Birth/Sex: Treating RN: May 02, 1941 (80 y.o. Elam Dutch Primary Care Nallely Yost: Anthonette Legato, LEE Other Clinician: Referring Merdis Snodgrass: Treating Shlomo Seres/Extender: Linton Ham MO RGA N, RUFFIN Weeks in Treatment: 16 Visit Information History Since Last Visit Added or deleted any medications: Yes Patient Arrived: Wheel Chair Any new allergies or adverse reactions: No Arrival Time: 12:48 Had a fall or experienced change in Yes Accompanied By: daughter activities of daily living that may affect Transfer Assistance: None risk of falls: Patient Identification Verified: Yes Signs or symptoms of abuse/neglect since last visito No Secondary Verification Process Completed: Yes Hospitalized since last visit: Yes Patient Requires Transmission-Based Precautions: No Implantable device outside of the clinic excluding No Patient Has Alerts: Yes cellular tissue based products placed in the center Patient Alerts: Patient on Blood Thinner since last visit: Has Dressing in Place as Prescribed: Yes Has Footwear/Offloading in Place as Prescribed: Yes Right: Other:globoped Pain Present Now: No Electronic Signature(s) Signed: 02/17/2021 6:41:14 PM By: Baruch Gouty RN, BSN Entered By: Baruch Gouty on 02/17/2021 12:50:38 -------------------------------------------------------------------------------- Encounter Discharge Information Details Patient Name: Date of Service: Dorothy Barker 02/17/2021 12:30 PM Medical Record Number: [id number] Patient Account Number: [id number] Date of Birth/Sex: Treating RN: Aug 19, 1941 (79 y.o. Dorothy Barker Primary Care Graeme Menees: Anthonette Legato, LEE Other Clinician: Referring Beverley Allender: Treating Cami Delawder/Extender: Linton Ham MO RGA N,  RUFFIN Weeks in Treatment: 16 Encounter Discharge Information Items Post Procedure Vitals Discharge Condition: Stable Temperature (F): 97.9 Ambulatory Status: Wheelchair Pulse (bpm): 85 Discharge Destination: Home Respiratory Rate (breaths/min): 18 Transportation: Private Auto Blood Pressure (mmHg): 175/81 Schedule Follow-up Appointment: No Clinical Summary of Care: Patient Declined Electronic Signature(s) Signed: 02/22/2021 6:17:43 PM By: Leane Call Entered By: Leane Call on 02/17/2021 16:12:32 -------------------------------------------------------------------------------- Lower Extremity Assessment Details Patient Name: Date of Service: Dorothy Barker, Dorothy Barker 02/17/2021 12:30 PM Medical Record Number: [id number] Patient Account Number: [id number] Date of Birth/Sex: Treating RN: 1941-08-05 (80 y.o. Elam Dutch Primary Care Akacia Boltz: Anthonette Legato, LEE Other Clinician: Referring Katerine Morua: Treating Saori Umholtz/Extender: Linton Ham MO RGA N, RUFFIN Weeks in Treatment: 16 Edema Assessment Assessed: [Left: No] [Right: No] Edema: [Left: Ye] [Right: s] Calf Left: Right: Point of Measurement: 28 cm From Medial Instep 35 cm Ankle Left: Right: Point of Measurement: 8 cm From Medial Instep 20.7 cm Vascular Assessment Pulses: Dorsalis Pedis Palpable: [Left:Yes] Electronic Signature(s) Signed: 02/17/2021 6:41:14 PM By: Baruch Gouty RN, BSN Entered By: Baruch Gouty on 02/17/2021 13:00:11 -------------------------------------------------------------------------------- Multi Wound Chart Details Patient Name: Date of Service: Dorothy Barker 02/17/2021 12:30 PM Medical Record Number: [id number] Patient Account Number: [id number] Date of Birth/Sex: Treating RN: October 04, 1940 (80 y.o. Dorothy Barker Primary Care Asli Tokarski: Anthonette Legato, LEE Other Clinician: Referring Roberto Romanoski: Treating Dartagnan Beavers/Extender: Linton Ham MO RGA N, RUFFIN Weeks in Treatment: 16 Vital  Signs Height(in): 62 Pulse(bpm): 85 Weight(lbs): 173 Blood Pressure(mmHg): 179/81 Body Mass Index(BMI): 32 Temperature(F): 97.9 Respiratory Rate(breaths/min): 18 Photos: [1:No Photos Left Calcaneus] [N/A:N/A N/A] Wound Location: [1:Pressure Injury] [N/A:N/A] Wounding Event: [1:Pressure Ulcer] [N/A:N/A] Primary Etiology: [1:Diabetic Wound/Ulcer of the Lower] [N/A:N/A] Secondary Etiology: [1:Extremity Cataracts, Hypertension, Type II] [N/A:N/A] Comorbid History: [1:Diabetes, Osteoarthritis, Dementia 09/21/2020] [N/A:N/A] Date Acquired: [1:16] [N/A:N/A] Weeks of Treatment: [1:Open] [N/A:N/A] Wound Status: [1:1.3x1.6x0.2] [N/A:N/A] Measurements L x W x D (cm) [1:1.634] [N/A:N/A] A (cm) : rea [1:0.327] [N/A:N/A] Volume (cm) : [1:58.70%] [N/A:N/A] %  Reduction in A rea: [1:17.40%] [N/A:N/A] % Reduction in Volume: [1:Unstageable/Unclassified] [N/A:N/A] Classification: [1:Small] [N/A:N/A] Exudate A mount: [1:Serosanguineous] [N/A:N/A] Exudate Type: [1:red, brown] [N/A:N/A] Exudate Color: [1:Distinct, outline attached] [N/A:N/A] Wound Margin: [1:None Present (0%)] [N/A:N/A] Granulation A mount: [1:Large (67-100%)] [N/A:N/A] Necrotic A mount: [1:Fat Layer (Subcutaneous Tissue): Yes N/A] Exposed Structures: [1:Fascia: No Tendon: No Muscle: No Joint: No Bone: No Small (1-33%)] [N/A:N/A] Epithelialization: [1:Debridement - Excisional] [N/A:N/A] Debridement: Pre-procedure Verification/Time Out 13:05 [N/A:N/A] Taken: [1:Lidocaine 4% Topical Solution] [N/A:N/A] Pain Control: [1:Subcutaneous, Slough] [N/A:N/A] Tissue Debrided: [1:Skin/Subcutaneous Tissue] [N/A:N/A] Level: [1:2.08] [N/A:N/A] Debridement A (sq cm): [1:rea Curette] [N/A:N/A] Instrument: [1:Moderate] [N/A:N/A] Bleeding: [1:Pressure] [N/A:N/A] Hemostasis A chieved: [1:0] [N/A:N/A] Procedural Pain: [1:0] [N/A:N/A] Post Procedural Pain: [1:Procedure was tolerated well] [N/A:N/A] Debridement Treatment Response: [1:1.3x1.6x0.3]  [N/A:N/A] Post Debridement Measurements L x W x D (cm) [1:0.49] [N/A:N/A] Post Debridement Volume: (cm) [1:Unstageable/Unclassified] [N/A:N/A] Post Debridement Stage: [1:Debridement] [N/A:N/A] Treatment Notes Electronic Signature(s) Signed: 02/17/2021 5:19:12 PM By: Linton Ham MD Signed: 02/17/2021 6:57:58 PM By: Deon Pilling Entered By: Linton Ham on 02/17/2021 13:26:12 -------------------------------------------------------------------------------- Multi-Disciplinary Care Plan Details Patient Name: Date of Service: Dorothy Barker 02/17/2021 12:30 PM Medical Record Number: [id number] Patient Account Number: [id number] Date of Birth/Sex: Treating RN: 1941-03-10 (80 y.o. Dorothy Barker Primary Care Anaelle Dunton: Anthonette Legato, LEE Other Clinician: Referring Clarice Zulauf: Treating Avalin Briley/Extender: Linton Ham MO RGA N, RUFFIN Weeks in Treatment: 16 Active Inactive Electronic Signature(s) Signed: 04/08/2021 12:23:20 PM By: Deon Pilling Signed: 05/19/2021 4:51:55 PM By: Levan Hurst RN, BSN Previous Signature: 02/17/2021 6:57:58 PM Version By: Deon Pilling Entered By: Levan Hurst on 03/22/2021 12:06:33 -------------------------------------------------------------------------------- Pain Assessment Details Patient Name: Date of Service: Dorothy Barker, Dorothy Barker 02/17/2021 12:30 PM Medical Record Number: [id number] Patient Account Number: [id number] Date of Birth/Sex: Treating RN: November 14, 1940 (80 y.o. Elam Dutch Primary Care Dasia Guerrier: Anthonette Legato, LEE Other Clinician: Referring Ishmael Berkovich: Treating Huong Luthi/Extender: Linton Ham MO RGA N, RUFFIN Weeks in Treatment: 16 Active Problems Location of Pain Severity and Description of Pain Patient Has Paino No Site Locations Rate the pain. Current Pain Level: 0 Pain Management and Medication Current Pain Management: Electronic Signature(s) Signed: 02/17/2021 6:41:14 PM By: Baruch Gouty RN, BSN Entered By: Baruch Gouty  on 02/17/2021 12:51:09 -------------------------------------------------------------------------------- Patient/Caregiver Education Details Patient Name: Date of Service: Dorothy Barker 6/30/2022andnbsp12:30 PM Medical Record Number: [id number] Patient Account Number: [id number] Date of Birth/Gender: Treating RN: 06/14/1941 (79 y.o. Dorothy Barker Primary Care Physician: Anthonette Legato, LEE Other Clinician: Referring Physician: Treating Physician/Extender: Linton Ham MO RGA N, RUFFIN Weeks in Treatment: 16 Education Assessment Education Provided To: Patient Education Topics Provided Wound/Skin Impairment: Handouts: Skin Care Do's and Dont's Methods: Explain/Verbal Responses: Reinforcements needed Electronic Signature(s) Signed: 02/17/2021 6:57:58 PM By: Deon Pilling Entered By: Deon Pilling on 02/17/2021 12:53:43 -------------------------------------------------------------------------------- Wound Assessment Details Patient Name: Date of Service: Dorothy Barker, Dorothy Barker 02/17/2021 12:30 PM Medical Record Number: [id number] Patient Account Number: [id number] Date of Birth/Sex: Treating RN: 1941-05-28 (80 y.o. Elam Dutch Primary Care Clark Cuff: Anthonette Legato, LEE Other Clinician: Referring Kyle Stansell: Treating Caston Coopersmith/Extender: Linton Ham MO RGA N, RUFFIN Weeks in Treatment: 16 Wound Status Wound Number: 1 Primary Etiology: Pressure Ulcer Wound Location: Left Calcaneus Secondary Diabetic Wound/Ulcer of the Lower Extremity Etiology: Wounding Event: Pressure Injury Wound Status: Open Date Acquired: 09/21/2020 Comorbid Cataracts, Hypertension, Type II Diabetes, Osteoarthritis, Weeks Of Treatment: 16 History: Dementia Clustered Wound: No Photos Wound Measurements Length: (cm) 1.3 Width: (cm) 1.6 Depth: (cm) 0.2 Area: (cm) 1.634 Volume: (cm) 0.327 % Reduction in  Area: 58.7% % Reduction in Volume: 17.4% Epithelialization: Small (1-33%) Tunneling: No Undermining:  No Wound Description Classification: Unstageable/Unclassified Wound Margin: Distinct, outline attached Exudate Amount: Small Exudate Type: Serosanguineous Exudate Color: red, brown Foul Odor After Cleansing: No Slough/Fibrino No Wound Bed Granulation Amount: None Present (0%) Exposed Structure Necrotic Amount: Large (67-100%) Fascia Exposed: No Necrotic Quality: Adherent Slough Fat Layer (Subcutaneous Tissue) Exposed: Yes Tendon Exposed: No Muscle Exposed: No Joint Exposed: No Bone Exposed: No Electronic Signature(s) Signed: 02/18/2021 10:25:49 AM By: Sandre Kitty Signed: 02/18/2021 1:24:03 PM By: Baruch Gouty RN, BSN Previous Signature: 02/17/2021 6:41:14 PM Version By: Baruch Gouty RN, BSN Entered By: Sandre Kitty on 02/18/2021 10:00:27 -------------------------------------------------------------------------------- [place] Details Patient Name: Date of Service: Dorothy Barker 02/17/2021 12:30 PM Medical Record Number: [id number] Patient Account Number: [id number] Date of Birth/Sex: Treating RN: June 07, 1941 (80 y.o. Elam Dutch Primary Care Deshayla Empson: Anthonette Legato, LEE Other Clinician: Referring Asianae Minkler: Treating Koreena Joost/Extender: Linton Ham MO RGA N, RUFFIN Weeks in Treatment: 16 Vital Signs Time Taken: 12:50 Temperature (F): 97.9 Height (in): 62 Pulse (bpm): 85 Weight (lbs): 173 Respiratory Rate (breaths/min): 18 Body Mass Index (BMI): 31.6 Blood Pressure (mmHg): 179/81 Reference Range: 80 - 120 mg / dl Electronic Signature(s) Signed: 02/17/2021 6:41:14 PM By: Baruch Gouty RN, BSN Entered By: Baruch Gouty on 02/17/2021 12:51:00

## 2021-02-22 NOTE — Progress Notes (Addendum)
ANTICOAGULATION CONSULT NOTE - Follow Up Consult  Pharmacy Consult for Pradaxa >> IV Heparin Indication: atrial fibrillation, stroke  Allergies  Allergen Reactions   Tramadol Other (See Comments)    Syncopal episodes    Amlodipine Besy-Benazepril Hcl Rash   Codeine Rash   Lotensin [Benazepril] Rash   Naproxen Rash    Patient Measurements: Height: 5' (152.4 cm) Weight: 78.9 kg (173 lb 15.1 oz) IBW/kg (Calculated) : 45.5 Heparin Dosing Weight:  63.5 kg  Vital Signs: Temp: 98.4 F (36.9 C) (07/05 1520) Temp Source: Axillary (07/05 1520) BP: 167/70 (07/05 1520) Pulse Rate: 80 (07/05 1520)  Labs: Recent Labs    02/21/21 2049 02/21/21 2053 02/22/21 0810  HGB 11.4* 11.9* 10.9*  HCT 34.7* 35.0* 33.3*  PLT 244  --  221  APTT 58*  --   --   LABPROT 19.9*  --   --   INR 1.7*  --   --   CREATININE 1.53* 1.50* 1.28*    Estimated Creatinine Clearance: 32.1 mL/min (A) (by C-G formula based on SCr of 1.28 mg/dL (H)).   Medical History: Past Medical History:  Diagnosis Date   Cancer ([place])    kidney   Chronic kidney disease    Chronic kidney disease, stage 3b ([place]) 02/06/2021   Diabetes mellitus without complication ([place])    Essential hypertension 02/06/2021   Hyperlipidemia    Hypertension    Hypothyroidism 02/06/2021   Paroxysmal atrial flutter ([place]) 02/06/2021   Stroke ([place])    Thyroid disease    Type 2 diabetes mellitus with stage 3b chronic kidney disease, with long-term current use of insulin ([place]) 02/06/2021   Vitamin D deficiency    Assessment: 80 yr old woman admitted with aflutter and found to have acute ischemic stroke, having had another recent stroke on 02/07/21. Pt was taking Pradaxa 150 mg PO BID PTA (last PTA dose at 0800 on 02/21/21). No thrombectomy or tPA since on Pradaxa. Pradaxa was resumed on admission and pt rec'd 1 dose at 0948 AM today (02/22/21).  Pt is unable to take PO, so pharmacy is consulted to transition pt from Pradaxa to IV heparin (no bolus, due  to recent stroke).  H/H 10.9,/33.3, plt 221; Scr 1.28, TBW CrCl ~43 ml/min; per RN, no bleeding issues observed  Goal of Therapy:  Heparin level: 0.3-0.5 units/ml Monitor platelets by anticoagulation protocol: Yes   Plan:  Start heparin infusion at 750 units/hr ~12 hrs after last Pradaxa dose (start at 2200 tonight) Check heparin level 8 hrs after starting infusion Monitor daily heparin level, CBC Monitor for bleeding  Gillermina Hu, PharmD, BCPS, [facility] Clinical Pharmacist

## 2021-02-22 NOTE — ED Notes (Signed)
Placed Breakfast Order 

## 2021-02-22 NOTE — ED Notes (Signed)
This RN went to hook pt up to leads that were taken off post MRI. PT bed soaked. Pt linen, gown, changed.Leads and Purewick reapplied. Warm blankets given.

## 2021-02-22 NOTE — Progress Notes (Signed)
ANTICOAGULATION CONSULT NOTE - Initial Consult  Pharmacy Consult for Pradaxa Indication: atrial fibrillation  Allergies  Allergen Reactions   Tramadol Other (See Comments)    Syncopal episodes    Amlodipine Besy-Benazepril Hcl Rash   Codeine Rash   Lotensin [Benazepril] Rash   Naproxen Rash    Patient Measurements: Height: 5' (152.4 cm) Weight: 78.9 kg (173 lb 15.1 oz) IBW/kg (Calculated) : 45.5  Vital Signs: Temp: 98.1 F (36.7 C) (07/05 0849) Temp Source: Axillary (07/05 0849) BP: 158/49 (07/05 0745) Pulse Rate: 75 (07/05 0745)  Labs: Recent Labs    02/21/21 2049 02/21/21 2053 02/22/21 0810  HGB 11.4* 11.9* 10.9*  HCT 34.7* 35.0* 33.3*  PLT 244  --  221  APTT 58*  --   --   LABPROT 19.9*  --   --   INR 1.7*  --   --   CREATININE 1.53* 1.50* 1.28*    Estimated Creatinine Clearance: 32.1 mL/min (A) (by C-G formula based on SCr of 1.28 mg/dL (H)).   Medical History: Past Medical History:  Diagnosis Date   Cancer ([place])    kidney   Chronic kidney disease    Chronic kidney disease, stage 3b ([place]) 02/06/2021   Diabetes mellitus without complication ([place])    Essential hypertension 02/06/2021   Hyperlipidemia    Hypertension    Hypothyroidism 02/06/2021   Paroxysmal atrial flutter ([place]) 02/06/2021   Stroke ([place])    Thyroid disease    Type 2 diabetes mellitus with stage 3b chronic kidney disease, with long-term current use of insulin ([place]) 02/06/2021   Vitamin D deficiency    Assessment: 80 yo M with aflutter (PTA Pradaxa) - found to have stroke, with another recent stroke on 02/07/21. No thrombectomy or tPA since on Pradaxa. Pradaxa being resumed.   Goal of Therapy:  Monitor platelets by anticoagulation protocol: Yes   Plan:  Resume home Pradaxa 150mg  PO BID  Joetta Manners, PharmD, [facility] Emergency Medicine Clinical Pharmacist ED RPh Phone: [place]: [phone]

## 2021-02-22 NOTE — Evaluation (Signed)
Clinical/Bedside Swallow Evaluation Patient Details  Name: Dorothy Barker MRN: [id number] Date of Birth: 07-Sep-1940  Today's Date: 02/22/2021 Time: SLP Start Time (ACUTE ONLY): 7124 SLP Stop Time (ACUTE ONLY): 5809 SLP Time Calculation (min) (ACUTE ONLY): 23 min  Past Medical History:  Past Medical History:  Diagnosis Date   Cancer ([place])    kidney   Chronic kidney disease    Chronic kidney disease, stage 3b ([place]) 02/06/2021   Diabetes mellitus without complication ([place])    Essential hypertension 02/06/2021   Hyperlipidemia    Hypertension    Hypothyroidism 02/06/2021   Paroxysmal atrial flutter ([place]) 02/06/2021   Stroke ([place])    Thyroid disease    Type 2 diabetes mellitus with stage 3b chronic kidney disease, with long-term current use of insulin ([place]) 02/06/2021   Vitamin D deficiency    Past Surgical History:  Past Surgical History:  Procedure Laterality Date   ABDOMINAL AORTOGRAM W/LOWER EXTREMITY N/A 12/01/2020   Procedure: ABDOMINAL AORTOGRAM W/LOWER EXTREMITY;  Surgeon: Cherre Robins, MD;  Location: [facility];  Service: Cardiovascular;  Laterality: N/A;   ABDOMINAL HYSTERECTOMY     KNEE SURGERY Right    NEPHRECTOMY Left    PERIPHERAL VASCULAR INTERVENTION Left 12/01/2020   Procedure: PERIPHERAL VASCULAR INTERVENTION;  Surgeon: Cherre Robins, MD;  Location: [facility];  Service: Cardiovascular;  Laterality: Left;   TONSILLECTOMY     HPI:  Pt is an 80 yo female presenting with acute onset ahasia. MRI revealed small acute infarct of the posterior left insula and a few scattered punctate foci of acute ischemia in the posterior left MCA territory. Pt initially passed a Yale swallow screen but there was concern about swallowing safety given decreased LOA and aphasia. PMH includes: DM, HLD, CKD, strokes including recent large acute L cerebellar stroke in June 2022, recent COVID (June 2022)   Assessment / Plan / Recommendation Clinical Impression  Pt is not alert  enough for PO trials this afternoon, not opening her eyes/mouth or showing any sign of awareness when boluses are presented to her lips. It is documented that she passed a Yale in the early hours of the morning - perhaps when more alert? Her daughter does not think she got much if any sleep last night, so if she is able to wake up at a later time, it is possible that she will be able to demonstrate more of her swallowing abilities. In the meantime, would hold any POs pending SLP reassessment. Will also f/u for cognitive-linguistic evaluation as alertness allows. SLP Visit Diagnosis: Dysphagia, unspecified (R13.10)    Aspiration Risk  Moderate aspiration risk    Diet Recommendation NPO   Medication Administration: Via alternative means    Other  Recommendations Oral Care Recommendations: Oral care QID   Follow up Recommendations Skilled Nursing facility      Frequency and Duration min 2x/week  2 weeks       Prognosis Prognosis for Safe Diet Advancement: Good Barriers to Reach Goals: Cognitive deficits;Language deficits      Swallow Study   General HPI: Pt is an 80 yo female presenting with acute onset ahasia. MRI revealed small acute infarct of the posterior left insula and a few scattered punctate foci of acute ischemia in the posterior left MCA territory. Pt initially passed a Yale swallow screen but there was concern about swallowing safety given decreased LOA and aphasia. PMH includes: DM, HLD, CKD, strokes including recent large acute L cerebellar stroke in June  2022, recent COVID (June 2022) Type of Study: Bedside Swallow Evaluation Previous Swallow Assessment: none in chart Diet Prior to this Study: Regular;Thin liquids Temperature Spikes Noted: No Respiratory Status: Room air History of Recent Intubation: No Behavior/Cognition: Lethargic/Drowsy;Doesn't follow directions Oral Cavity Assessment: Other (comment) (UTA) Oral Care Completed by SLP: No Oral Cavity - Dentition:  Other (Comment) (UTA) Self-Feeding Abilities: Total assist Patient Positioning: Upright in bed Baseline Vocal Quality: Not observed Volitional Cough: Cognitively unable to elicit Volitional Swallow: Unable to elicit    Oral/Motor/Sensory Function Overall Oral Motor/Sensory Function: Other (comment) (UTA)   Ice Chips Ice chips: Impaired Presentation: Spoon Oral Phase Impairments: Poor awareness of bolus   Thin Liquid Thin Liquid: Impaired Presentation: Spoon Oral Phase Impairments: Poor awareness of bolus    Nectar Thick Nectar Thick Liquid: Not tested   Honey Thick Honey Thick Liquid: Not tested   Puree Puree: Impaired Presentation: Spoon Oral Phase Impairments: Poor awareness of bolus   Solid     Solid: Not tested      Osie Bond., M.A. [place] Pager [phone] Office [phone]  02/22/2021,5:31 PM

## 2021-02-22 NOTE — Progress Notes (Addendum)
STROKE TEAM PROGRESS NOTE   SUBJECTIVE (INTERVAL HISTORY) Dorothy Barker was laying comfortably in bed on my visit. She would not participate in conversation but clearly said "Lamont Snowball, your hands are cold" and told me her name once. Otherwise, speech was random sounds, numbers and letters. She has has no acute events since arriving.  MRI scan of the brain shows a left insular and temporal lobe cortical embolic infarct.  CT angiogram shows severe left MCA and moderate right MCA as well as severe left posterior cervical artery stenosis.  LDL cholesterol is 56 mg percent.  Hemoglobin A1c is 8.4. OBJECTIVE Vitals:   02/22/21 0901 02/22/21 1026 02/22/21 1215 02/22/21 1230  BP: (!) 168/66 (!) 153/80 (!) 143/77 (!) 166/79  Pulse: 76 81 67 70  Resp: 16  19 15   Temp:      TempSrc:      SpO2: 99% 98% 98% 99%  Weight:      Height:        CBC:  Recent Labs  Lab 02/21/21 2049 02/21/21 2053 02/22/21 0810  WBC 11.1*  --  9.3  NEUTROABS 6.8  --  6.2  HGB 11.4* 11.9* 10.9*  HCT 34.7* 35.0* 33.3*  MCV 89.4  --  89.8  PLT 244  --  149    Basic Metabolic Panel:  Recent Labs  Lab 02/21/21 2049 02/21/21 2053 02/22/21 0810  NA 134* 135 134*  K 3.4* 3.4* 3.5  CL 101 101 99  CO2 22  --  26  GLUCOSE 294* 283* 206*  BUN 26* 27* 23  CREATININE 1.53* 1.50* 1.28*  CALCIUM 10.1  --  9.9    Lipid Panel:  Recent Labs  Lab 02/22/21 0311  CHOL 126  TRIG 136  HDL 43  CHOLHDL 2.9  VLDL 27  LDLCALC 56   HgbA1c:  Lab Results  Component Value Date   HGBA1C 8.4 (H) 02/22/2021   Urine Drug Screen: No results found for: LABOPIA, COCAINSCRNUR, LABBENZ, AMPHETMU, THCU, LABBARB  Alcohol Level     Component Value Date/Time   ETH <10 02/22/2021 0308    IMAGING  Results for orders placed or performed during the hospital encounter of 02/21/21  MR BRAIN WO CONTRAST   Narrative   CLINICAL DATA:  Encephalopathy and aphasia  EXAM: MRI HEAD WITHOUT CONTRAST  TECHNIQUE: Multiplanar, multiecho pulse  sequences of the brain and surrounding structures were obtained without intravenous contrast.  COMPARISON:  02/07/2021  FINDINGS: Brain: Small acute infarct of the posterior left insula. There are a few scattered punctate foci of acute ischemia in the posterior left MCA territory, near the parietal temporal junction. Hemosiderin deposition at the site of old right basal ganglia infarct. No acute hemorrhage. There is multifocal hyperintense T2-weighted signal within the white matter. Generalized volume loss without a clear lobar predilection. The midline structures are normal.  Vascular: Major flow voids are preserved.  Skull and upper cervical spine: Normal calvarium and skull base. Visualized upper cervical spine and soft tissues are normal.  Sinuses/Orbits:No paranasal sinus fluid levels or advanced mucosal thickening. No mastoid or middle ear effusion. Left maxillary retention cyst normal orbits.  IMPRESSION: 1. Small acute infarct of the posterior left insula. No hemorrhage or mass effect. 2. A few scattered punctate foci of acute ischemia in the posterior left MCA territory, near the parietal temporal junction. 3. Old right basal ganglia infarct and findings of chronic ischemic microangiopathy.   Electronically Signed   By: Ulyses Jarred M.D.   On: 02/21/2021  23:11   Results for orders placed or performed during the hospital encounter of 02/06/21  CT Head Wo Contrast   Narrative   CLINICAL DATA:  Fall  EXAM: CT HEAD WITHOUT CONTRAST  CT CERVICAL SPINE WITHOUT CONTRAST  TECHNIQUE: Multidetector CT imaging of the head and cervical spine was performed following the standard protocol without intravenous contrast. Multiplanar CT image reconstructions of the cervical spine were also generated.  COMPARISON:  None.  FINDINGS: CT HEAD FINDINGS  Brain: Area of edema in the left cerebellum measures approximately 4.0 x 3.5 cm. There is an old infarct of the right  basal ganglia. There is periventricular hypoattenuation compatible with chronic microvascular disease. There is generalized atrophy without lobar predilection. No acute hemorrhage  Vascular: No abnormal hyperdensity of the major intracranial arteries or dural venous sinuses. No intracranial atherosclerosis.  Skull: The visualized skull base, calvarium and extracranial soft tissues are normal.  Sinuses/Orbits: No fluid levels or advanced mucosal thickening of the visualized paranasal sinuses. No mastoid or middle ear effusion. The orbits are normal.  CT CERVICAL SPINE FINDINGS  Alignment: No static subluxation. Facets are aligned. Occipital condyles are normally positioned.  Skull base and vertebrae: No acute fracture.  Soft tissues and spinal canal: No prevertebral fluid or swelling. No visible canal hematoma.  Disc levels: Large central disc protrusion at C4-5 indents the ventral spinal cord. Small central disc protrusions also at C2-3 and C3-4 narrows the ventral thecal sac.  Upper chest: No pneumothorax, pulmonary nodule or pleural effusion.  Other: Normal visualized paraspinal cervical soft tissues.  IMPRESSION: 1. Area of edema in the left cerebellum without associated mass effect or acute hemorrhage, most likely a subacute infarct. MRI of the brain with and without contrast is recommended for further characterization and to exclude neoplasm. 2. No acute fracture or static subluxation of the cervical spine. 3. 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   Electronically Signed   By: Ulyses Jarred M.D.   On: 02/06/2021 20:01     PHYSICAL EXAM  General exam   Pleasant elderly Caucasian lady not in distress  HEENT-  Normocephalic, no lesions, without obvious abnormality.  Normal external eye and conjunctiva.   Cardiovascular- RRR Lungs-Breathing comfortably on room air Abdomen- Soft Musculoskeletal-no joint tenderness, deformity  or swelling Skin-warm and dry, no hyperpigmentation, vitiligo, or suspicious lesions  Neurologic Exam: Mental Status: Alert.  Globally aphasic.  Does not follow any commands including midline.  Nonfluent speech but occasionally can speak clear sentences and at times speech becomes gibberish and difficult to understand. Cranial Nerves:  II:  Pupils equal, round. She clearly looked at the provider speaking to her. III,IV, VI: Ptosis not present, extra-ocular motions intact bilaterally V,VII: Face symmetric VIII: Hearing assumed to be normal bilaterally IX,X: Unable to assess XI: Unable to assess XII: Unable to assess Motor: Upper extremities moved antigravity bilaterally. Ankle and plantar flexion 3/5. Tone and bulk:normal tone throughout; no atrophy noted Sensory: Unable to assess Cerebellar: Unable to assess Gait: Deferred    ASSESSMENT/PLAN Ms. LATESE DUFAULT is a 80 y.o. female with a past medical history significant for atrial flutter (on dabigatran), diabetes, hyperlipidemia, CKD, strokes including recent large acute left cerebellar stroke in June 2022 who presents from her facility as a stroke code for global aphasia and confusion on [id number]. Imaging on arrival showed severe stenosis of the left MCA mid M1 segment and proximal M2 segment; moderate-to-severe stenosis of the distal right M1 segment as well as  a small acute infarct of the posterior left insula and a few scattered punctate foci of acute ischemia in the posterior left MCA territory.  # Stroke: Small acute infarct of the posterior left insula and a few scattered punctate foci of acute ischemia in the posterior left MCA territory likely secondary to severe intracranial multivessel atherosclerosis as well as atrial flutter Unfortunately, pradaxa has failed Ms. Doyle Askew. The distribution of infarcts likely explains her aphasia. It is possible that she is encephalopathic secondary to the UTI, but labs (B12, folate, TSH, ammonia) right  now do not support this. Will continue with stroke workup and monitor.  Carotid Doppler pending 2D Echo  pending LDL 56 HgbA1c 8.4 Currently on dabigatran for anticoagulation - continue for now Ongoing aggressive stroke risk factor management PT/OT/SLP Disposition: When medically appropriate, pending PT/OT recs  Hypertension Bps 140s-170s/70-80s; Continue metoprolol 50mg  Permissive hypertension (OK if < 220/120) but gradually normalize in 5-7 days Long-term BP goal normotensive  Hyperlipidemia Lisinopril 30mg  daily at home; lipitor 40mg  in the hospital LDL 56, goal < 70 Continue statin at discharge  Diabetes type II HgbA1c 8.4, goal < 7.0 Continue SSI  UTI Urine culture pending No antibiotics ordered right now. Will defer antibiotic decision to medicine service  Hyperthyroid TSH 1.686 Continue armour thyroid  Hospital day # 1  Solon Augusta, PhD, PA-C Stroke [phone] I have personally obtained history,examined this patient, reviewed notes, independently viewed imaging studies, participated in medical decision making and plan of care.ROS completed by me personally and pertinent positives fully documented  I have made any additions or clarifications directly to the above note. Agree with note above.  Patient has presented with aphasia secondary to left MCA branch embolic infarct from underlying atrial flutter as well as severe intracranial atherosclerosis.  She was on Pradaxa which has failed.  Recommend adding aspirin 81 mg daily given severe underlying intracranial atherosclerosis as well.  This will lead to increased risk of bleeding but given failure of Pradaxa it may be worth the risk.  No family available at bedside for discussion.  Discussed with Dr. Florene Glen.  Greater than 50% time during this 35-minute visit was spent in counseling and coordination of care about his stroke and development of evaluation treatment plan and answering questions.  Antony Contras, MD Medical  Director [place] Pager: [phone] 02/22/2021 5:39 PM  To contact Stroke Continuity provider, please refer to [email or web address] After hours, contact General Neurology

## 2021-02-22 NOTE — ED Notes (Signed)
Daughter of the patient currently at bedside. She states " the patient does not have DNR form, we discussed this last night that she is to be a full code."   Dr. Florene Glen notified along with the nurse taking the patient at bedside.

## 2021-02-22 NOTE — Progress Notes (Addendum)
PROGRESS NOTE    Dorothy Barker  HQR:[id number] DOB: 10-23-1940 DOA: 02/21/2021 PCP: Dorothy Phoenix, NP (  Chief Complaint  Patient presents with   Code Stroke   Brief Narrative:  80 year old female with past medical history of hyperlipidemia, hypertension, insulin-dependent diabetes mellitus type 2, hypothyroidism, atrial flutter (on Pradaxa), peripheral vascular disease, and multiple recent strokes including presumed cardioembolic stroke diagnosed 08/2020 followed by cerebellar infarct suffered on 6/20 who presents to [facility] emergency department via EMS as [name] code stroke after development of new onset aphasia.     Assessment & Plan:   Principal Problem:   Acute ischemic stroke [facility]) Active Problems:   Essential hypertension   Hypothyroidism   Paroxysmal atrial flutter (HCC)   Type 2 diabetes mellitus with stage 3b chronic kidney disease, with long-term current use of insulin (HCC)   Chronic kidney disease, stage 3b (HCC)  Acute Ischemic Stroke MRI brain with small acute infarct of the posterior L insula, few scattered punctate foci of acute ischemia in the posterior L MCA territory, near the parietal temporal junction, old right basal ganglia infarct and findings of chronic ischemic microangiopathy  CT angio neck without emergent LVO, mild progression of severe stenosis of the L MCA mid M1 segment and proximal M2 segment.  Unchanged moderate to severe stenosis of the distal R m1 segment.  Unchanged severe stenosis of the L PCA P2 segment. Follow pending echo  LDL 56, A1c 8.4 Continue pradaxa per neurology, recommending ASA as well.  Continue statin.  While pt NPO due to mental status. Will start heparin gtt while unable to take pradaxa.  PT/OT/SLP - SLP recommending NPO   Acute Metabolic Encephalopathy Suspect 2/2 stroke and acute hospitalization B12 low normal (follow MMA), folate wnl, TSH, ammonia wnl UA negative nitrite and LE, not c/w UTI - follow urine  culture Delirium precautions Ok with family staying overnight due to encephalopathy  COVID positive She was covid positive 6/19 with mild/asx disease She's been vaccinated (hasn't had 2nd booster) As greater than 10 days from initial dx, no need for isolation  Abnormal UA  Microscopic Hematuria Follow repeat outpatient   Hypertension Permissive hypertension, continuing only metop for now  Hypothyroidism Continue armour thyroid  Atrial Flutter Continue metoprolol  T2DM Reduced dose lantus SSI  DVT prophylaxis: pradaxa Code Status:full  - per RN, daughter at bedside, [place] notes patient desires full code - order changed - discussed with daughter over phone Family Communication: none at bedside Disposition:   Status is: Inpatient  Remains inpatient appropriate because:Inpatient level of care appropriate due to severity of illness  Dispo: The patient is from: Home              Anticipated d/c is to: Home              Patient currently is not medically stable to d/c.   Difficult to place patient No       Consultants:  neurology  Procedures:    Antimicrobials:  Anti-infectives (From admission, onward)    None          Subjective: unresponsive  Objective: Vitals:   02/22/21 1026 02/22/21 1215 02/22/21 1230 02/22/21 1520  BP: (!) 153/80 (!) 143/77 (!) 166/79 (!) 167/70  Pulse: 81 67 70 80  Resp:  19 15 16   Temp:    98.4 F (36.9 C)  TempSrc:    Axillary  SpO2: 98% 98% 99%   Weight:      Height:  Intake/Output Summary (Last 24 hours) at 02/22/2021 1621 Last data filed at 02/22/2021 0354 Gross per 24 hour  Intake 3 ml  Output --  Net 3 ml   Filed Weights   02/21/21 2156  Weight: 78.9 kg    Examination:  General exam: Appears calm and comfortable  Respiratory system: Clear to auscultation. Respiratory effort normal. Cardiovascular system: S1 & S2 heard, RRR Gastrointestinal system: Abdomen is nondistended, soft and nontender.   Central nervous system: responds only to painful stimuli, pulls away, mumbles mostly incomprehensible words, moves all extremities.  PERRL. Extremities: no LEE, left foot in prafo boot Skin: No rashes, lesions or ulcers   Data Reviewed: I have personally reviewed following labs and imaging studies  CBC: Recent Labs  Lab 02/21/21 2049 02/21/21 2053 02/22/21 0810  WBC 11.1*  --  9.3  NEUTROABS 6.8  --  6.2  HGB 11.4* 11.9* 10.9*  HCT 34.7* 35.0* 33.3*  MCV 89.4  --  89.8  PLT 244  --  782    Basic Metabolic Panel: Recent Labs  Lab 02/21/21 2049 02/21/21 2053 02/22/21 0810  NA 134* 135 134*  K 3.4* 3.4* 3.5  CL 101 101 99  CO2 22  --  26  GLUCOSE 294* 283* 206*  BUN 26* 27* 23  CREATININE 1.53* 1.50* 1.28*  CALCIUM 10.1  --  9.9    GFR: Estimated Creatinine Clearance: 32.1 mL/min ([name]) (by C-G formula based on SCr of 1.28 mg/dL (H)).  Liver Function Tests: Recent Labs  Lab 02/21/21 2049 02/22/21 0810  AST 20 19  ALT 18 15  ALKPHOS 64 57  BILITOT 0.6 0.9  PROT 6.4* 5.8*  ALBUMIN 3.0* 3.0*    CBG: Recent Labs  Lab 02/21/21 2049 02/22/21 0353 02/22/21 0740 02/22/21 1344  GLUCAP 285* 249* 216* 107*     No results found for this or any previous visit (from the past 240 hour(s)).       Radiology Studies: MR BRAIN WO CONTRAST  Result Date: 02/21/2021 CLINICAL DATA:  Encephalopathy and aphasia EXAM: MRI HEAD WITHOUT CONTRAST TECHNIQUE: Multiplanar, multiecho pulse sequences of the brain and surrounding structures were obtained without intravenous contrast. COMPARISON:  02/07/2021 FINDINGS: Brain: Small acute infarct of the posterior left insula. There are [name] few scattered punctate foci of acute ischemia in the posterior left MCA territory, near the parietal temporal junction. Hemosiderin deposition at the site of old right basal ganglia infarct. No acute hemorrhage. There is multifocal hyperintense T2-weighted signal within the white matter. Generalized  volume loss without [name] clear lobar predilection. The midline structures are normal. Vascular: Major flow voids are preserved. Skull and upper cervical spine: Normal calvarium and skull base. Visualized upper cervical spine and soft tissues are normal. Sinuses/Orbits:No paranasal sinus fluid levels or advanced mucosal thickening. No mastoid or middle ear effusion. Left maxillary retention cyst normal orbits. IMPRESSION: 1. Small acute infarct of the posterior left insula. No hemorrhage or mass effect. 2. Shepherd Finnan few scattered punctate foci of acute ischemia in the posterior left MCA territory, near the parietal temporal junction. 3. Old right basal ganglia infarct and findings of chronic ischemic microangiopathy. Electronically Signed   By: Ulyses Jarred M.D.   On: 02/21/2021 23:11   CT HEAD CODE STROKE WO CONTRAST  Result Date: 02/21/2021 CLINICAL DATA:  Code stroke.  Aphasia EXAM: CT HEAD WITHOUT CONTRAST TECHNIQUE: Contiguous axial images were obtained from the base of the skull through the vertex without intravenous contrast. COMPARISON:  02/07/2021 FINDINGS: Brain: There  is no mass, hemorrhage or extra-axial collection. There is generalized atrophy without lobar predilection. There old infarcts of left cerebellum right basal. There is periventricular hypoattenuation compatible with chronic microvascular disease. No acute cortical infarct is evident. Vascular: Atherosclerotic calcification of the internal carotid arteries at the skull base. No abnormal hyperdensity of the major intracranial arteries or dural venous sinuses. Skull: The visualized skull base, calvarium and extracranial soft tissues are normal. Sinuses/Orbits: No fluid levels or advanced mucosal thickening of the visualized paranasal sinuses. No mastoid or middle ear effusion. Left maxillary retention cyst. The orbits are normal. ASPECTS ([place] Stroke Program Early CT Score) - Ganglionic level infarction (caudate, lentiform nuclei, internal capsule,  insula, M1-M3 cortex): 7 - Supraganglionic infarction (M4-M6 cortex): 3 Total score (0-10 with 10 being normal): 10 IMPRESSION: 1. No acute intracranial abnormality. 2. ASPECTS is 10. 3. Old infarcts of the left cerebellum and right basal ganglia. These results were communicated to Dr. Donnetta Simpers at 9:07 pm on 02/21/2021 by text page via the [facility] messaging system. Electronically Signed   By: Ulyses Jarred M.D.   On: 02/21/2021 21:07   CT ANGIO HEAD CODE STROKE  Result Date: 02/21/2021 CLINICAL DATA:  Aphasia EXAM: CT ANGIOGRAPHY HEAD AND NECK TECHNIQUE: Multidetector CT imaging of the head and neck was performed using the standard protocol during bolus administration of intravenous contrast. Multiplanar CT image reconstructions and MIPs were obtained to evaluate the vascular anatomy. Carotid stenosis measurements (when applicable) are obtained utilizing NASCET criteria, using the distal internal carotid diameter as the denominator. CONTRAST:  81mL OMNIPAQUE IOHEXOL 350 MG/ML SOLN COMPARISON:  Head CT 02/21/2021 CTA head neck 02/07/2021 FINDINGS: CTA NECK FINDINGS SKELETON: There is no bony spinal canal stenosis. No lytic or blastic lesion. OTHER NECK: Normal pharynx, larynx and major salivary glands. No cervical lymphadenopathy. Unremarkable thyroid gland. UPPER CHEST: No pneumothorax or pleural effusion. No nodules or masses. AORTIC ARCH: There is calcific atherosclerosis of the aortic arch. There is no aneurysm, dissection or hemodynamically significant stenosis of the visualized portion of the aorta. Conventional 3 vessel aortic branching pattern. The visualized proximal subclavian arteries are widely patent. RIGHT CAROTID SYSTEM: No dissection, occlusion or aneurysm. Mild atherosclerotic calcification at the carotid bifurcation without hemodynamically significant stenosis. LEFT CAROTID SYSTEM: No dissection, occlusion or aneurysm. Mild atherosclerotic calcification at the carotid bifurcation without  hemodynamically significant stenosis. VERTEBRAL ARTERIES: Left dominant configuration. Both origins are clearly patent. There is no dissection, occlusion or flow-limiting stenosis to the skull base (V1-V3 segments). CTA HEAD FINDINGS POSTERIOR CIRCULATION: --Vertebral arteries: Normal V4 segments. --Inferior cerebellar arteries: Normal. --Basilar artery: Normal. --Superior cerebellar arteries: Normal. --Posterior cerebral arteries (PCA): Severe stenosis of the left PCA P2 segment, unchanged. ANTERIOR CIRCULATION: --Intracranial internal carotid arteries: Normal. --Anterior cerebral arteries (ACA): Normal. Both A1 segments are present. Patent anterior communicating artery (Blakelyn Dinges-comm). --Middle cerebral arteries (MCA): There is severe stenosis of the left mid M1 segment and proximal left M2 segment, which is slightly progressed since 02/07/2021. There is moderate-to-severe stenosis of the distal right M1 segment, unchanged. No occlusion. VENOUS SINUSES: As permitted by contrast timing, patent. ANATOMIC VARIANTS: None Review of the MIP images confirms the above findings. IMPRESSION: 1. No emergent large vessel occlusion. 2. Mild progression of severe stenosis of the left MCA mid M1 segment and proximal M2 segment. 3. Unchanged moderate-to-severe stenosis of the distal right M1 segment. 4. Unchanged severe stenosis of the left PCA P2 segment. Aortic Atherosclerosis (ICD10-I70.0). Electronically Signed   By: Cletus Gash.D.  On: 02/21/2021 21:21   CT ANGIO NECK CODE STROKE  Result Date: 02/21/2021 CLINICAL DATA:  Aphasia EXAM: CT ANGIOGRAPHY HEAD AND NECK TECHNIQUE: Multidetector CT imaging of the head and neck was performed using the standard protocol during bolus administration of intravenous contrast. Multiplanar CT image reconstructions and MIPs were obtained to evaluate the vascular anatomy. Carotid stenosis measurements (when applicable) are obtained utilizing NASCET criteria, using the distal internal carotid  diameter as the denominator. CONTRAST:  80mL OMNIPAQUE IOHEXOL 350 MG/ML SOLN COMPARISON:  Head CT 02/21/2021 CTA head neck 02/07/2021 FINDINGS: CTA NECK FINDINGS SKELETON: There is no bony spinal canal stenosis. No lytic or blastic lesion. OTHER NECK: Normal pharynx, larynx and major salivary glands. No cervical lymphadenopathy. Unremarkable thyroid gland. UPPER CHEST: No pneumothorax or pleural effusion. No nodules or masses. AORTIC ARCH: There is calcific atherosclerosis of the aortic arch. There is no aneurysm, dissection or hemodynamically significant stenosis of the visualized portion of the aorta. Conventional 3 vessel aortic branching pattern. The visualized proximal subclavian arteries are widely patent. RIGHT CAROTID SYSTEM: No dissection, occlusion or aneurysm. Mild atherosclerotic calcification at the carotid bifurcation without hemodynamically significant stenosis. LEFT CAROTID SYSTEM: No dissection, occlusion or aneurysm. Mild atherosclerotic calcification at the carotid bifurcation without hemodynamically significant stenosis. VERTEBRAL ARTERIES: Left dominant configuration. Both origins are clearly patent. There is no dissection, occlusion or flow-limiting stenosis to the skull base (V1-V3 segments). CTA HEAD FINDINGS POSTERIOR CIRCULATION: --Vertebral arteries: Normal V4 segments. --Inferior cerebellar arteries: Normal. --Basilar artery: Normal. --Superior cerebellar arteries: Normal. --Posterior cerebral arteries (PCA): Severe stenosis of the left PCA P2 segment, unchanged. ANTERIOR CIRCULATION: --Intracranial internal carotid arteries: Normal. --Anterior cerebral arteries (ACA): Normal. Both A1 segments are present. Patent anterior communicating artery (Chaniah Cisse-comm). --Middle cerebral arteries (MCA): There is severe stenosis of the left mid M1 segment and proximal left M2 segment, which is slightly progressed since 02/07/2021. There is moderate-to-severe stenosis of the distal right M1 segment,  unchanged. No occlusion. VENOUS SINUSES: As permitted by contrast timing, patent. ANATOMIC VARIANTS: None Review of the MIP images confirms the above findings. IMPRESSION: 1. No emergent large vessel occlusion. 2. Mild progression of severe stenosis of the left MCA mid M1 segment and proximal M2 segment. 3. Unchanged moderate-to-severe stenosis of the distal right M1 segment. 4. Unchanged severe stenosis of the left PCA P2 segment. Aortic Atherosclerosis (ICD10-I70.0). Electronically Signed   By: Ulyses Jarred M.D.   On: 02/21/2021 21:21        Scheduled Meds:   stroke: mapping our early stages of recovery book   Does not apply Once   atorvastatin  40 mg Oral QHS   dabigatran  150 mg Oral Q12H   insulin aspart  0-15 Units Subcutaneous TID AC & HS   insulin glargine  26 Units Subcutaneous QHS   metoprolol tartrate  50 mg Oral BID   pantoprazole  40 mg Oral Daily   thyroid  60 mg Oral QAC breakfast   Continuous Infusions:   LOS: 1 day    Time spent: over 30 min    Fayrene Helper, MD [facility]   To contact the attending provider between 7A-7P or the covering provider during after hours 7P-7A, please log into the web site [email or web address] and access using universal [place] password for that web site. If you do not have the password, please call the hospital operator.  02/22/2021, 4:21 PM

## 2021-02-22 NOTE — H&P (Addendum)
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    Patient is unable to provide a history due to expressive aphasia and therefore majority of history has been taken from discussion with the emergency department staff as well as discussion with the daughter.  Patient was last known normal was at approximately 1830 on 7/4.  By 1700, patient was noted to be confused and aphasic with slurred and inappropriate speech.  Reports that the day prior the patient was acting normally and completing various crossword puzzles that were quite difficult with these.  Earlier in the day on 7/4 she spoke to her on the phone and while she did seem to have some intermittent slurred speech otherwise seemed fine.  Shortly after being identified to have difficulty with speech EMS was contacted who promptly came to evaluate the patient and brought the patient to [facility] emergency department as a code stroke.  Patient was promptly evaluated in the emergency department as a code stroke by neurology Dr. Lorrin Goodell.  Patient quickly underwent noncontrast CT of the head followed by MRI of the brain.  MRI of the brain has revealed an acute left posterior insula stroke with scattered punctate foci that appear like areas of acute ischemia in the distribution of the posterior left MCA.  The hospitalist  group has now been called to assess the patient for admission to the hospital.   Review of Systems:   Review of Systems  Unable to perform ROS: Patient nonverbal   Past Medical History:  Diagnosis Date   Cancer ([place])    kidney   Chronic kidney disease    Chronic kidney disease, stage 3b ([place]) 02/06/2021   Diabetes mellitus without complication ([place])    Essential hypertension 02/06/2021   Hyperlipidemia    Hypertension    Hypothyroidism 02/06/2021   Paroxysmal atrial flutter ([place]) 02/06/2021   Stroke ([place])    Thyroid disease    Type 2 diabetes mellitus with stage 3b chronic kidney disease, with long-term current use of insulin ([place]) 02/06/2021   Vitamin D deficiency     Past Surgical History:  Procedure Laterality Date   ABDOMINAL AORTOGRAM W/LOWER EXTREMITY N/A 12/01/2020   Procedure: ABDOMINAL AORTOGRAM W/LOWER EXTREMITY;  Surgeon: Cherre Robins, MD;  Location: [place] [facility];  Service: Cardiovascular;  Laterality: N/A;   ABDOMINAL HYSTERECTOMY     KNEE SURGERY Right    NEPHRECTOMY Left    PERIPHERAL VASCULAR INTERVENTION Left 12/01/2020   Procedure: PERIPHERAL VASCULAR INTERVENTION;  Surgeon: Cherre Robins, MD;  Location: [place] [facility];  Service: Cardiovascular;  Laterality: Left;   TONSILLECTOMY       reports that she has never smoked. She has been exposed to tobacco smoke. She has never used smokeless tobacco. She reports that she does not drink alcohol and does not use drugs.  Allergies  Allergen Reactions   Tramadol Other (See Comments)    Syncopal episodes  Amlodipine Besy-Benazepril Hcl Rash   Codeine Rash   Lotensin [Benazepril] Rash   Naproxen Rash    Family History  Problem Relation Age of Onset   Heart disease Neg Hx      Prior to Admission medications   Medication Sig Start Date End Date Taking? Authorizing Provider  acetaminophen (TYLENOL) 500 MG tablet Take 1,000 mg by mouth every 8 (eight) hours as needed for mild pain or moderate  pain.   Yes [provider]  atorvastatin (LIPITOR) 40 MG tablet Take 40 mg by mouth at bedtime. 10/11/20  Yes [provider]  dabigatran (PRADAXA) 150 MG CAPS capsule Take 1 capsule (150 mg total) by mouth every 12 (twelve) hours. 02/08/21  Yes Thurnell Lose, MD  hydrALAZINE (APRESOLINE) 10 MG tablet Take 10 mg by mouth 4 (four) times daily. 8am, 12p, 4p, 8p (hold for SBP <100)   Yes [provider]  hydrochlorothiazide (HYDRODIURIL) 25 MG tablet Take 25 mg by mouth daily.   Yes [provider]  insulin glargine (LANTUS) 100 unit/mL SOPN Inject 26 Units into the skin at bedtime.   Yes [provider]  lisinopril (ZESTRIL) 30 MG tablet Take 30 mg by mouth daily.   Yes [provider]  metoprolol tartrate (LOPRESSOR) 50 MG tablet Take 1 tablet (50 mg total) by mouth 2 (two) times daily. 02/08/21  Yes Thurnell Lose, MD  pantoprazole (PROTONIX) 40 MG tablet Take 1 tablet (40 mg total) by mouth daily. 02/09/21  Yes Thurnell Lose, MD  thyroid (ARMOUR) 60 MG tablet Take 60 mg by mouth every morning.   Yes [provider]  Vitamin D, Ergocalciferol, (DRISDOL) 1.25 MG (50000 UNIT) CAPS capsule Take 50,000 Units by mouth every Thursday.   Yes [provider]    Physical Exam: Vitals:   02/21/21 2149 02/21/21 2156  BP: (!) 179/127   Pulse: 91   Resp: 13   SpO2: 97%   Weight:  78.9 kg  Height:  5' (1.524 m)    Constitutional: Patient exhibiting an expressive aphasia but is awake and alert and does not appear to be in any associated distress.   Skin: no rashes, no lesions, good skin turgor noted. Eyes: Pupils are equally reactive to light.  No evidence of scleral icterus or conjunctival pallor.  ENMT: Moist mucous membranes noted.  Posterior pharynx clear of any exudate or lesions.   Neck: normal, supple, no masses, no thyromegaly.  No evidence of jugular venous distension.   Respiratory: clear to auscultation  bilaterally, no wheezing, no crackles. Normal respiratory effort. No accessory muscle use.  Cardiovascular: Regular rate and rhythm, no murmurs / rubs / gallops. No extremity edema. 2+ pedal pulses. No carotid bruits.  Chest:   Nontender without crepitus or deformity.   Back:   Nontender without crepitus or deformity. Abdomen: Abdomen is soft and nontender.  No evidence of intra-abdominal masses.  Positive bowel sounds noted in all quadrants.   Musculoskeletal: No joint deformity upper and lower extremities. Good ROM, no contractures. Normal muscle tone.  Neurologic: Patient awake and alert but exhibiting both what appears to be an expressive and receptive aphasia.  Patient intermittently following commands.  Patient moving all 4 extremities spontaneously.  Sensation intact.  Patient moving all 4 extremities spontaneously.  Patient is responsive to verbal stimuli.   Psychiatric: Unable to assess due to receptive and expressive aphasia.  Patient currently does not seem to possess insight as to her current situation.  Labs on  Admission: I have personally reviewed following labs and imaging studies -   CBC: Recent Labs  Lab 02/21/21 2049 02/21/21 2053  WBC 11.1*  --   NEUTROABS 6.8  --   HGB 11.4* 11.9*  HCT 34.7* 35.0*  MCV 89.4  --   PLT 244  --    Basic Metabolic Panel: Recent Labs  Lab 02/21/21 2049 02/21/21 2053  NA 134* 135  K 3.4* 3.4*  CL 101 101  CO2 22  --   GLUCOSE 294* 283*  BUN 26* 27*  CREATININE 1.53* 1.50*  CALCIUM 10.1  --    GFR: Estimated Creatinine Clearance: 27.4 mL/min (A) (by C-G formula based on SCr of 1.5 mg/dL (H)). Liver Function Tests: Recent Labs  Lab 02/21/21 2049  AST 20  ALT 18  ALKPHOS 64  BILITOT 0.6  PROT 6.4*  ALBUMIN 3.0*   No results for input(s): LIPASE, AMYLASE in the last 168 hours. No results for input(s): AMMONIA in the last 168 hours. Coagulation Profile: Recent Labs  Lab 02/21/21 2049  INR 1.7*   Cardiac Enzymes: No  results for input(s): CKTOTAL, CKMB, CKMBINDEX, TROPONINI in the last 168 hours. BNP (last 3 results) No results for input(s): PROBNP in the last 8760 hours. HbA1C: No results for input(s): HGBA1C in the last 72 hours. CBG: Recent Labs  Lab 02/21/21 2049  GLUCAP 285*   Lipid Profile: No results for input(s): CHOL, HDL, LDLCALC, TRIG, CHOLHDL, LDLDIRECT in the last 72 hours. Thyroid Function Tests: No results for input(s): TSH, T4TOTAL, FREET4, T3FREE, THYROIDAB in the last 72 hours. Anemia Panel: No results for input(s): VITAMINB12, FOLATE, FERRITIN, TIBC, IRON, RETICCTPCT in the last 72 hours. Urine analysis:    Component Value Date/Time   COLORURINE STRAW (A) 02/21/2021 2151   APPEARANCEUR CLEAR 02/21/2021 2151   LABSPEC 1.011 02/21/2021 2151   PHURINE 6.0 02/21/2021 2151   GLUCOSEU >=500 (A) 02/21/2021 2151   HGBUR MODERATE (A) 02/21/2021 2151   BILIRUBINUR NEGATIVE 02/21/2021 2151   [place] NEGATIVE 02/21/2021 2151   PROTEINUR 100 (A) 02/21/2021 2151   NITRITE NEGATIVE 02/21/2021 2151   LEUKOCYTESUR NEGATIVE 02/21/2021 2151    Radiological Exams on Admission - Personally Reviewed: CT HEAD CODE STROKE WO CONTRAST  Result Date: 02/21/2021 CLINICAL DATA:  Code stroke.  Aphasia EXAM: CT HEAD WITHOUT CONTRAST TECHNIQUE: Contiguous axial images were obtained from the base of the skull through the vertex without intravenous contrast. COMPARISON:  02/07/2021 FINDINGS: Brain: There is no mass, hemorrhage or extra-axial collection. There is generalized atrophy without lobar predilection. There old infarcts of left cerebellum right basal. There is periventricular hypoattenuation compatible with chronic microvascular disease. No acute cortical infarct is evident. Vascular: Atherosclerotic calcification of the internal carotid arteries at the skull base. No abnormal hyperdensity of the major intracranial arteries or dural venous sinuses. Skull: The visualized skull base, calvarium and  extracranial soft tissues are normal. Sinuses/Orbits: No fluid levels or advanced mucosal thickening of the visualized paranasal sinuses. No mastoid or middle ear effusion. Left maxillary retention cyst. The orbits are normal. ASPECTS (Central Stroke Program Early CT Score) - Ganglionic level infarction (caudate, lentiform nuclei, internal capsule, insula, M1-M3 cortex): 7 - Supraganglionic infarction (M4-M6 cortex): 3 Total score (0-10 with 10 being normal): 10 IMPRESSION: 1. No acute intracranial abnormality. 2. ASPECTS is 10. 3. Old infarcts of the left cerebellum and right basal ganglia. These results were communicated to Dr. Donnetta Simpers at 9:07 pm on 02/21/2021 by text page via the [facility] messaging system.  Electronically Signed   By: Ulyses Jarred M.D.   On: 02/21/2021 21:07   CT ANGIO HEAD CODE STROKE  Result Date: 02/21/2021 CLINICAL DATA:  Aphasia EXAM: CT ANGIOGRAPHY HEAD AND NECK TECHNIQUE: Multidetector CT imaging of the head and neck was performed using the standard protocol during bolus administration of intravenous contrast. Multiplanar CT image reconstructions and MIPs were obtained to evaluate the vascular anatomy. Carotid stenosis measurements (when applicable) are obtained utilizing NASCET criteria, using the distal internal carotid diameter as the denominator. CONTRAST:  23mL OMNIPAQUE IOHEXOL 350 MG/ML SOLN COMPARISON:  Head CT 02/21/2021 CTA head neck 02/07/2021 FINDINGS: CTA NECK FINDINGS SKELETON: There is no bony spinal canal stenosis. No lytic or blastic lesion. OTHER NECK: Normal pharynx, larynx and major salivary glands. No cervical lymphadenopathy. Unremarkable thyroid gland. UPPER CHEST: No pneumothorax or pleural effusion. No nodules or masses. AORTIC ARCH: There is calcific atherosclerosis of the aortic arch. There is no aneurysm, dissection or hemodynamically significant stenosis of the visualized portion of the aorta. Conventional 3 vessel aortic branching pattern. The  visualized proximal subclavian arteries are widely patent. RIGHT CAROTID SYSTEM: No dissection, occlusion or aneurysm. Mild atherosclerotic calcification at the carotid bifurcation without hemodynamically significant stenosis. LEFT CAROTID SYSTEM: No dissection, occlusion or aneurysm. Mild atherosclerotic calcification at the carotid bifurcation without hemodynamically significant stenosis. VERTEBRAL ARTERIES: Left dominant configuration. Both origins are clearly patent. There is no dissection, occlusion or flow-limiting stenosis to the skull base (V1-V3 segments). CTA HEAD FINDINGS POSTERIOR CIRCULATION: --Vertebral arteries: Normal V4 segments. --Inferior cerebellar arteries: Normal. --Basilar artery: Normal. --Superior cerebellar arteries: Normal. --Posterior cerebral arteries (PCA): Severe stenosis of the left PCA P2 segment, unchanged. ANTERIOR CIRCULATION: --Intracranial internal carotid arteries: Normal. --Anterior cerebral arteries (ACA): Normal. Both A1 segments are present. Patent anterior communicating artery (a-comm). --Middle cerebral arteries (MCA): There is severe stenosis of the left mid M1 segment and proximal left M2 segment, which is slightly progressed since 02/07/2021. There is moderate-to-severe stenosis of the distal right M1 segment, unchanged. No occlusion. VENOUS SINUSES: As permitted by contrast timing, patent. ANATOMIC VARIANTS: None Review of the MIP images confirms the above findings. IMPRESSION: 1. No emergent large vessel occlusion. 2. Mild progression of severe stenosis of the left MCA mid M1 segment and proximal M2 segment. 3. Unchanged moderate-to-severe stenosis of the distal right M1 segment. 4. Unchanged severe stenosis of the left PCA P2 segment. Aortic Atherosclerosis (ICD10-I70.0). Electronically Signed   By: Ulyses Jarred M.D.   On: 02/21/2021 21:21   CT ANGIO NECK CODE STROKE  Result Date: 02/21/2021 CLINICAL DATA:  Aphasia EXAM: CT ANGIOGRAPHY HEAD AND NECK TECHNIQUE:  Multidetector CT imaging of the head and neck was performed using the standard protocol during bolus administration of intravenous contrast. Multiplanar CT image reconstructions and MIPs were obtained to evaluate the vascular anatomy. Carotid stenosis measurements (when applicable) are obtained utilizing NASCET criteria, using the distal internal carotid diameter as the denominator. CONTRAST:  53mL OMNIPAQUE IOHEXOL 350 MG/ML SOLN COMPARISON:  Head CT 02/21/2021 CTA head neck 02/07/2021 FINDINGS: CTA NECK FINDINGS SKELETON: There is no bony spinal canal stenosis. No lytic or blastic lesion. OTHER NECK: Normal pharynx, larynx and major salivary glands. No cervical lymphadenopathy. Unremarkable thyroid gland. UPPER CHEST: No pneumothorax or pleural effusion. No nodules or masses. AORTIC ARCH: There is calcific atherosclerosis of the aortic arch. There is no aneurysm, dissection or hemodynamically significant stenosis of the visualized portion of the aorta. Conventional 3 vessel aortic branching pattern. The visualized proximal subclavian arteries are widely  patent. RIGHT CAROTID SYSTEM: No dissection, occlusion or aneurysm. Mild atherosclerotic calcification at the carotid bifurcation without hemodynamically significant stenosis. LEFT CAROTID SYSTEM: No dissection, occlusion or aneurysm. Mild atherosclerotic calcification at the carotid bifurcation without hemodynamically significant stenosis. VERTEBRAL ARTERIES: Left dominant configuration. Both origins are clearly patent. There is no dissection, occlusion or flow-limiting stenosis to the skull base (V1-V3 segments). CTA HEAD FINDINGS POSTERIOR CIRCULATION: --Vertebral arteries: Normal V4 segments. --Inferior cerebellar arteries: Normal. --Basilar artery: Normal. --Superior cerebellar arteries: Normal. --Posterior cerebral arteries (PCA): Severe stenosis of the left PCA P2 segment, unchanged. ANTERIOR CIRCULATION: --Intracranial internal carotid arteries: Normal.  --Anterior cerebral arteries (ACA): Normal. Both A1 segments are present. Patent anterior communicating artery (a-comm). --Middle cerebral arteries (MCA): There is severe stenosis of the left mid M1 segment and proximal left M2 segment, which is slightly progressed since 02/07/2021. There is moderate-to-severe stenosis of the distal right M1 segment, unchanged. No occlusion. VENOUS SINUSES: As permitted by contrast timing, patent. ANATOMIC VARIANTS: None Review of the MIP images confirms the above findings. IMPRESSION: 1. No emergent large vessel occlusion. 2. Mild progression of severe stenosis of the left MCA mid M1 segment and proximal M2 segment. 3. Unchanged moderate-to-severe stenosis of the distal right M1 segment. 4. Unchanged severe stenosis of the left PCA P2 segment. Aortic Atherosclerosis (ICD10-I70.0). Electronically Signed   By: Ulyses Jarred M.D.   On: 02/21/2021 21:21    EKG: Personally reviewed.  Rhythm is normal sinus rhythm with heart rate of 91 bpm.  No dynamic ST segment changes appreciated.  Assessment/Plan Principal Problem:   Acute ischemic stroke [facility])  Patient presenting with sudden onset of what appears to be both an expressive and receptive aphasia at approximately 1900 MRI brain revealing acute stroke of the left posterior insula along with multiple scattered punctate foci appearing to be acute ischemia associated the posterior left MCA Neurology following, their input is appreciated. Holding home regimen of Pradaxa to minimize risk of hemorrhagic conversion.  Serial neurologic checks Permissive hypertension Recent hemoglobin A1c and lipid panel performed on 6/20 and 6/21 PT, OT, SLP evaluation  Active Problems:   Essential hypertension  Holding antihypertensive therapy with exception of home regimen of metoprolol due to history of atrial flutter    Hypothyroidism  Continue home regimen of [place] Thyroid    Paroxysmal atrial flutter (HCC)  Holding Pradaxa to  minimize risk of hemorrhagic conversion Monitor patient on telemetry Currently in normal sinus rhythm Continuing home regimen metoprolol    Type 2 diabetes mellitus with stage 3b chronic kidney disease, with long-term current use of insulin (HCC)  Reduced dose of Lantus this evening due to tenuous oral intake Accu-Cheks before every meal and nightly with sliding scale insulin Hemoglobin A1c performed on 6/28 found to be 9.0%.    Chronic kidney disease, stage 3b (HCC)  Strict intake and output monitoring Creatinine near baseline Minimizing nephrotoxic agents as much as possible Serial chemistries to monitor renal function and electrolytes   Code Status:  DNR Family Communication: Discussed plan of care with daughter via phone conversation.  Status is: Inpatient  Remains inpatient appropriate because:Ongoing diagnostic testing needed not appropriate for outpatient work up, IV treatments appropriate due to intensity of illness or inability to take PO, and Inpatient level of care appropriate due to severity of illness  Dispo: The patient is from: ALF              Anticipated d/c is to: ALF  Patient currently is not medically stable to d/c.   Difficult to place patient No        Vernelle Emerald MD [facility] Pager [phone]  If 7PM-7AM, please contact night-coverage [email or web address] Use universal [place] password for that web site. If you do not have the password, please call the hospital operator.  02/22/2021, 2:38 AM

## 2021-02-23 ENCOUNTER — Inpatient Hospital Stay (HOSPITAL_COMMUNITY): Payer: Medicare PPO

## 2021-02-23 DIAGNOSIS — E039 Hypothyroidism, unspecified: Secondary | ICD-10-CM | POA: Diagnosis not present

## 2021-02-23 DIAGNOSIS — U071 COVID-19: Secondary | ICD-10-CM | POA: Diagnosis not present

## 2021-02-23 DIAGNOSIS — R4189 Other symptoms and signs involving cognitive functions and awareness: Secondary | ICD-10-CM

## 2021-02-23 DIAGNOSIS — I639 Cerebral infarction, unspecified: Secondary | ICD-10-CM | POA: Diagnosis not present

## 2021-02-23 DIAGNOSIS — I10 Essential (primary) hypertension: Secondary | ICD-10-CM | POA: Diagnosis not present

## 2021-02-23 DIAGNOSIS — I6389 Other cerebral infarction: Secondary | ICD-10-CM

## 2021-02-23 DIAGNOSIS — Z7189 Other specified counseling: Secondary | ICD-10-CM

## 2021-02-23 DIAGNOSIS — R5381 Other malaise: Secondary | ICD-10-CM

## 2021-02-23 DIAGNOSIS — N1832 Chronic kidney disease, stage 3b: Secondary | ICD-10-CM | POA: Diagnosis not present

## 2021-02-23 LAB — CBC WITH DIFFERENTIAL/PLATELET
Abs Immature Granulocytes: 0.04 10*3/uL (ref 0.00–0.07)
Basophils Absolute: 0.1 10*3/uL (ref 0.0–0.1)
Basophils Relative: 1 %
Eosinophils Absolute: 0.2 10*3/uL (ref 0.0–0.5)
Eosinophils Relative: 2 %
HCT: 33.4 % — ABNORMAL LOW (ref 36.0–46.0)
Hemoglobin: 11.1 g/dL — ABNORMAL LOW (ref 12.0–15.0)
Immature Granulocytes: 0 %
Lymphocytes Relative: 26 %
Lymphs Abs: 2.4 10*3/uL (ref 0.7–4.0)
MCH: 29.4 pg (ref 26.0–34.0)
MCHC: 33.2 g/dL (ref 30.0–36.0)
MCV: 88.6 fL (ref 80.0–100.0)
Monocytes Absolute: 0.7 10*3/uL (ref 0.1–1.0)
Monocytes Relative: 8 %
Neutro Abs: 5.7 10*3/uL (ref 1.7–7.7)
Neutrophils Relative %: 63 %
Platelets: 230 10*3/uL (ref 150–400)
RBC: 3.77 MIL/uL — ABNORMAL LOW (ref 3.87–5.11)
RDW: 12.1 % (ref 11.5–15.5)
WBC: 9.1 10*3/uL (ref 4.0–10.5)
nRBC: 0 % (ref 0.0–0.2)

## 2021-02-23 LAB — COMPREHENSIVE METABOLIC PANEL
ALT: 17 U/L (ref 0–44)
AST: 20 U/L (ref 15–41)
Albumin: 2.9 g/dL — ABNORMAL LOW (ref 3.5–5.0)
Alkaline Phosphatase: 58 U/L (ref 38–126)
Anion gap: 8 (ref 5–15)
BUN: 20 mg/dL (ref 8–23)
CO2: 26 mmol/L (ref 22–32)
Calcium: 9.8 mg/dL (ref 8.9–10.3)
Chloride: 103 mmol/L (ref 98–111)
Creatinine, Ser: 1.31 mg/dL — ABNORMAL HIGH (ref 0.44–1.00)
GFR, Estimated: 41 mL/min — ABNORMAL LOW (ref >60)
Glucose, Bld: 154 mg/dL — ABNORMAL HIGH (ref 70–99)
Potassium: 3.4 mmol/L — ABNORMAL LOW (ref 3.5–5.1)
Sodium: 137 mmol/L (ref 135–145)
Total Bilirubin: 0.9 mg/dL (ref 0.3–1.2)
Total Protein: 6 g/dL — ABNORMAL LOW (ref 6.5–8.1)

## 2021-02-23 LAB — ECHOCARDIOGRAM COMPLETE
AR max vel: 2.39 cm2
AV Area VTI: 2.5 cm2
AV Area mean vel: 2.35 cm2
AV Mean grad: 3 mmHg
AV Peak grad: 5.5 mmHg
Ao pk vel: 1.17 m/s
Area-P 1/2: 3.17 cm2
Height: 60 in
S' Lateral: 2.5 cm
Weight: 2783.09 oz

## 2021-02-23 LAB — GLUCOSE, CAPILLARY
Glucose-Capillary: 194 mg/dL — ABNORMAL HIGH (ref 70–99)
Glucose-Capillary: 177 mg/dL — ABNORMAL HIGH (ref 70–99)
Glucose-Capillary: 81 mg/dL (ref 70–99)
Glucose-Capillary: 129 mg/dL — ABNORMAL HIGH (ref 70–99)

## 2021-02-23 LAB — URINE CULTURE: Culture: NO GROWTH

## 2021-02-23 LAB — PHOSPHORUS: Phosphorus: 3.1 mg/dL (ref 2.5–4.6)

## 2021-02-23 LAB — MAGNESIUM: Magnesium: 1.6 mg/dL — ABNORMAL LOW (ref 1.7–2.4)

## 2021-02-23 LAB — HEPARIN LEVEL (UNFRACTIONATED): Heparin Unfractionated: 0.16 IU/mL — ABNORMAL LOW (ref 0.30–0.70)

## 2021-02-23 MED ORDER — COLLAGENASE 250 UNIT/GM EX OINT
TOPICAL_OINTMENT | Freq: Every day | CUTANEOUS | Status: DC
  Filled 2021-02-23: qty 30

## 2021-02-23 MED ORDER — DABIGATRAN ETEXILATE MESYLATE 150 MG PO CAPS
150.0000 mg | ORAL_CAPSULE | Freq: Two times a day (BID) | ORAL | Status: DC
  Administered 2021-02-23: 150 mg via ORAL
  Filled 2021-02-23 (×2): qty 1

## 2021-02-23 MED ORDER — DEXTROSE IN LACTATED RINGERS 5 % IV SOLN: INTRAVENOUS | Status: DC

## 2021-02-23 MED ORDER — MAGNESIUM SULFATE 2 GM/50ML IV SOLN
2.0000 g | Freq: Once | INTRAVENOUS | Status: AC
  Administered 2021-02-23: 2 g via INTRAVENOUS
  Filled 2021-02-23: qty 50

## 2021-02-23 MED ORDER — POTASSIUM CHLORIDE 20 MEQ PO PACK
40.0000 meq | PACK | Freq: Once | ORAL | Status: AC
  Administered 2021-02-23: 40 meq via ORAL
  Filled 2021-02-23: qty 2

## 2021-02-23 NOTE — Progress Notes (Signed)
  Speech Language Pathology Treatment: Cognitive-Linquistic (Aphasia)  Patient Details Name: Dorothy Barker MRN: [id number] DOB: [birth year] Today's Date: 02/23/2021 Time: 7741-4239 SLP Time Calculation (min) (ACUTE ONLY): 12 min  Assessment / Plan / Recommendation Clinical Impression  Pt was seen for aphasia treatment with her daughter and son-in-law. She was alert throughout the session. Pt's family was educated regarding the nature of aphasia vs impairments in cognition, the results of the evaluation, and strategies to improve auditory comprehension, facilitate communication with the pt, and to reduce the pt's frustration. Literature regarding aphasia was provided to the pt's family and communication board was given to the pt to augment verbal communication. Pt's family verbalized understanding as well as agreement regarding all areas of education and pt's daughter was noted to use some communication strategies towards the end of the session. SLP will continue to follow pt.     HPI HPI: Pt is an 80 yo female presenting with acute onset ahasia. MRI revealed small acute infarct of the posterior left insula and a few scattered punctate foci of acute ischemia in the posterior left MCA territory. Pt initially passed a Yale swallow screen but there was concern about swallowing safety given decreased LOA and aphasia. PMH includes: DM, HLD, CKD, strokes including recent large acute L cerebellar stroke in June 2022, recent COVID (June 2022)      SLP Plan  Continue with current plan of care  Patient needs continued Speech Lanaguage Pathology Services    Recommendations  Diet recommendations: Dysphagia 2 (fine chop);Thin liquid Liquids provided via: Cup;Straw Medication Administration: Crushed with puree Supervision: Staff to assist with self feeding Compensations: Slow rate;Small sips/bites Postural Changes and/or Swallow Maneuvers: Seated upright 90 degrees                Oral Care  Recommendations: Oral care BID Follow up Recommendations: Skilled Nursing facility SLP Visit Diagnosis: Aphasia (R47.01) Plan: Continue with current plan of care       Laker Thompson I. Hardin Negus, [place], [place] Office number [phone] Pager [place] 02/23/2021, 10:37 AM      .

## 2021-02-23 NOTE — Evaluation (Signed)
Speech Language Pathology Evaluation Patient Details Name: Dorothy Barker MRN: [id number] DOB: 09/04/1940 Today's Date: 02/23/2021 Time: 5621-3086 SLP Time Calculation (min) (ACUTE ONLY): 15 min  Problem List:  Patient Active Problem List   Diagnosis Date Noted   Pressure injury of skin 02/08/2021   Left upper lobe pulmonary nodule 02/07/2021   Acute ischemic stroke ([place]) 02/06/2021   Fall at home, initial encounter 02/06/2021   Essential hypertension 02/06/2021   Hypothyroidism 02/06/2021   Paroxysmal atrial flutter ([place]) 02/06/2021   Type 2 diabetes mellitus with stage 3b chronic kidney disease, with long-term current use of insulin ([place]) 02/06/2021   Hypokalemia 02/06/2021   Chronic kidney disease, stage 3b ([place]) 02/06/2021   Atrial flutter ([place]) 01/27/2021   Secondary hypercoagulable state ([place]) 01/27/2021   Decubitus ulcer of heel, left, unstageable ([place]) 10/18/2020   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   Venous insufficiency of both lower extremities 10/18/2020   Fall 09/14/2020   Renal cyst, right 07/26/2020   Numbness and tingling of foot 07/02/2017   Onychomycosis due to dermatophyte 12/29/2016   Other acquired hammer toe 01/03/2016   Hypothyroidism 03/03/2008   Past Medical History:  Past Medical History:  Diagnosis Date   Cancer ([place])    kidney   Chronic kidney disease    Chronic kidney disease, stage 3b ([place]) 02/06/2021   Diabetes mellitus without complication ([place])    Essential hypertension 02/06/2021   Hyperlipidemia    Hypertension    Hypothyroidism 02/06/2021   Paroxysmal atrial flutter ([place]) 02/06/2021   Stroke ([place])    Thyroid disease    Type 2 diabetes mellitus with stage 3b chronic kidney disease, with long-term current use of insulin ([place]) 02/06/2021   Vitamin D deficiency    Past Surgical History:  Past Surgical History:  Procedure Laterality Date   ABDOMINAL AORTOGRAM W/LOWER EXTREMITY N/A 12/01/2020   Procedure: ABDOMINAL AORTOGRAM  W/LOWER EXTREMITY;  Surgeon: Cherre Robins, MD;  Location: [facility];  Service: Cardiovascular;  Laterality: N/A;   ABDOMINAL HYSTERECTOMY     KNEE SURGERY Right    NEPHRECTOMY Left    PERIPHERAL VASCULAR INTERVENTION Left 12/01/2020   Procedure: PERIPHERAL VASCULAR INTERVENTION;  Surgeon: Cherre Robins, MD;  Location: [facility];  Service: Cardiovascular;  Laterality: Left;   TONSILLECTOMY     HPI:  Pt is an 80 yo female presenting with acute onset ahasia. MRI revealed small acute infarct of the posterior left insula and a few scattered punctate foci of acute ischemia in the posterior left MCA territory. Pt initially passed a Yale swallow screen but there was concern about swallowing safety given decreased LOA and aphasia. PMH includes: DM, HLD, CKD, strokes including recent large acute L cerebellar stroke in June 2022, recent COVID (June 2022)   Assessment / Plan / Recommendation Clinical Impression  Pt was seen for speech/language evaluation with her daughter and son-in-law present. Pt's family denied the pt having any baseline deficits in speech, language, or cognition. 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 She spontaneously produced words and short phrases throughout the evaluation in response to some questions, and frequently stated, "I'm cold" or "I'm so cold". However, she exhibited difficulty with confrontational naming, repetition, sentence formulation during structured tasks. A single semantic paraphasia was noted during confrontational naming. Receptively, she responded to some simple yes/no questions, but perseveration on "yes" was noted. Auditory comprehension at this level is therefore questioned. No significant motor speech  deficits were noted, but this was difficult to assess due to paucity of verbal output. Cognitive-linguistic evaluation was deferred due to noted language impairments.  Skilled SLP services are clinically indicated at this time to improve language skills.    SLP Assessment  SLP Recommendation/Assessment: Patient needs continued Speech Lanaguage Pathology Services SLP Visit Diagnosis: Aphasia (R47.01)    Follow Up Recommendations  Skilled Nursing facility    Frequency and Duration min 2x/week  2 weeks      SLP Evaluation Cognition  Overall Cognitive Status: Difficult to assess (due to aphasia) Orientation Level: Disoriented X4       Comprehension  Auditory Comprehension Overall Auditory Comprehension: Impaired Yes/No Questions: Impaired Basic Immediate Environment Questions:  (2/5 with only "yes" responses) Commands: Impaired One Step Basic Commands:  (0/5) Reading Comprehension Reading Status: Not tested    Expression Expression Primary Mode of Expression: Verbal Verbal Expression Overall Verbal Expression: Impaired Initiation: Impaired Automatic Speech: Counting (0/10) Level of Generative/Spontaneous Verbalization: Word;Phrase Repetition: Impaired Level of Impairment: Word level (0/4) Naming: Impairment Confrontation:  (0/3) Verbal Errors: Semantic paraphasias   Oral / Motor  Oral Motor/Sensory Function Overall Oral Motor/Sensory Function: Other (comment) (Difficult to assess due to auditory comprehension difficulty) Motor Speech Overall Motor Speech: Appears within functional limits for tasks assessed (though output was limited)   Leoncio Hansen I. Hardin Negus, [place], [place] Office number [phone] Pager [phone]                    Horton Marshall 02/23/2021, 10:27 AM

## 2021-02-23 NOTE — Progress Notes (Signed)
  Speech Language Pathology Treatment: Dysphagia  Patient Details Name: Dorothy Barker MRN: [id number] DOB: 04/22/1941 Today's Date: 02/23/2021 Time: 0737-1062 SLP Time Calculation (min) (ACUTE ONLY): 12 min  Assessment / Plan / Recommendation Clinical Impression  Pt was seen for dysphagia treatment with her daughter present. Pt was alert and cooperative during the session. She tolerated puree, dysphagia 3 solids, regular texture solids, and thin liquids without overt s/sx of aspiration. Mastication was prolonged with solids and bolus awareness was mildly reduced, but oral clearance was functional. Pt's overall swallow function appears notably improved compared to that demonstrated 7/5. A dysphagia 2 diet with thin liquids will be initiated at this time. SLP will continue to follow pt.    HPI HPI: Pt is an 81 yo female presenting with acute onset ahasia. MRI revealed small acute infarct of the posterior left insula and a few scattered punctate foci of acute ischemia in the posterior left MCA territory. Pt initially passed a Yale swallow screen but there was concern about swallowing safety given decreased LOA and aphasia. PMH includes: DM, HLD, CKD, strokes including recent large acute L cerebellar stroke in June 2022, recent COVID (June 2022)      SLP Plan  Continue with current plan of care       Recommendations  Diet recommendations: Dysphagia 2 (fine chop);Thin liquid Liquids provided via: Cup;Straw Medication Administration: Crushed with puree Supervision: Staff to assist with self feeding Compensations: Slow rate;Small sips/bites Postural Changes and/or Swallow Maneuvers: Seated upright 90 degrees                Oral Care Recommendations: Oral care BID Follow up Recommendations: Skilled Nursing facility SLP Visit Diagnosis: Dysphagia, unspecified (R13.10) Plan: Continue with current plan of care       Dorothy Barker I. Dorothy Barker, Dorothy Barker, Dorothy Barker Office  number [phone] Pager [place] 02/23/2021, 10:10 AM

## 2021-02-23 NOTE — NC FL2 (Signed)
[place] LEVEL OF CARE SCREENING TOOL     IDENTIFICATION  Patient Name: Dorothy Barker Birthdate: 04-Jan-1941 Sex: female Admission Date (Current Location): 02/21/2021  [facility] and [place] Number:  Herbalist and Address:  [facility], [place] [address] [place], [place] [address]      Provider Number: [id number]  Attending Physician Name and Address:  Mercy Riding, MD  Relative Name and Phone Number:       Current Level of Care: Hospital Recommended Level of Care: [place] Prior Approval Number:    Date Approved/Denied:   PASRR Number: [id number]  Discharge Plan: SNF    Current Diagnoses: Patient Active Problem List   Diagnosis Date Noted   Pressure injury of skin 02/08/2021   Left upper lobe pulmonary nodule 02/07/2021   Acute ischemic stroke ([place]) 02/06/2021   Fall at home, initial encounter 02/06/2021   Essential hypertension 02/06/2021   Hypothyroidism 02/06/2021   Paroxysmal atrial flutter ([place]) 02/06/2021   Type 2 diabetes mellitus with stage 3b chronic kidney disease, with long-term current use of insulin ([place]) 02/06/2021   Hypokalemia 02/06/2021   Chronic kidney disease, stage 3b ([place]) 02/06/2021   Atrial flutter ([place]) 01/27/2021   Secondary hypercoagulable state ([place]) 01/27/2021   Decubitus ulcer of heel, left, unstageable ([place]) 10/18/2020   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   Venous insufficiency of both lower extremities 10/18/2020   Fall 09/14/2020   Renal cyst, right 07/26/2020   Numbness and tingling of foot 07/02/2017   Onychomycosis due to dermatophyte 12/29/2016   Other acquired hammer toe 01/03/2016   Hypothyroidism 03/03/2008    Orientation RESPIRATION BLADDER Height & Weight        Normal Incontinent Weight: 173 lb 15.1 oz (78.9 kg) Height:  5' (152.4 cm)  BEHAVIORAL SYMPTOMS/MOOD NEUROLOGICAL BOWEL NUTRITION STATUS      Continent Diet (see DC summary)   AMBULATORY STATUS COMMUNICATION OF NEEDS Skin   Extensive Assist Verbally PU Stage and Appropriate Care PU Stage 1 Dressing: Daily (unstageable left heel, santyl with wet to dry and foam overlay dressing changed daily)                     Personal Care Assistance Level of Assistance  Bathing, Feeding, Dressing Bathing Assistance: Maximum assistance Feeding assistance: Limited assistance Dressing Assistance: Maximum assistance     Functional Limitations Info             SPECIAL CARE FACTORS FREQUENCY  PT (By licensed PT), OT (By licensed OT), Speech therapy     PT Frequency: 5x/wk OT Frequency: 5x/wk     Speech Therapy Frequency: 5x/wk      Contractures Contractures Info: Not present    Additional Factors Info  Code Status, Allergies, Insulin Sliding Scale Code Status Info: Full Allergies Info: Tramadol, Amlodipine Besy-benazepril Hcl, Codeine, Lotensin (Benazepril), Naproxen   Insulin Sliding Scale Info: see DC summary       Current Medications (02/23/2021):  This is the current hospital active medication list Current Facility-Administered Medications  Medication Dose Route Frequency Provider Last Rate Last Admin    stroke: mapping our early stages of recovery book   Does not apply Once Shalhoub, Sherryll Burger, MD       acetaminophen (TYLENOL) tablet 650 mg  650 mg Oral Q4H PRN Shalhoub, Sherryll Burger, MD       Or   acetaminophen (TYLENOL) 160 MG/5ML solution 650 mg  650 mg  Per Tube Q4H PRN Shalhoub, Sherryll Burger, MD       Or   acetaminophen (TYLENOL) suppository 650 mg  650 mg Rectal Q4H PRN Shalhoub, Sherryll Burger, MD       aspirin EC tablet 81 mg  81 mg Oral Daily Leonie Man, Pramod S, MD   81 mg at 02/23/21 1025   atorvastatin (LIPITOR) tablet 40 mg  40 mg Oral QHS Shalhoub, Sherryll Burger, MD       collagenase (SANTYL) ointment   Topical Daily Mercy Riding, MD   Given at 02/23/21 1130   heparin ADULT infusion 100 units/mL (25000 units/239mL)  750 Units/hr Intravenous Continuous  Elodia Florence., MD 7.5 mL/hr at 02/22/21 2300 750 Units/hr at 02/22/21 2300   insulin aspart (novoLOG) injection 0-9 Units  0-9 Units Subcutaneous TID WC Elodia Florence., MD   2 Units at 02/23/21 8299   insulin glargine (LANTUS) injection 15 Units  15 Units Subcutaneous QHS Elodia Florence., MD   15 Units at 02/23/21 1026   metoprolol tartrate (LOPRESSOR) tablet 50 mg  50 mg Oral BID Vernelle Emerald, MD   50 mg at 02/23/21 1025   ondansetron (ZOFRAN) injection 4 mg  4 mg Intravenous Q6H PRN Vernelle Emerald, MD       pantoprazole (PROTONIX) injection 40 mg  40 mg Intravenous QHS Elodia Florence., MD   40 mg at 02/22/21 2313   polyethylene glycol (MIRALAX / GLYCOLAX) packet 17 g  17 g Oral Daily PRN Vernelle Emerald, MD       thyroid (ARMOUR) tablet 60 mg  60 mg Oral QAC breakfast Shalhoub, Sherryll Burger, MD   60 mg at 02/23/21 1025     Discharge Medications: Please see discharge summary for a list of discharge medications.  Relevant Imaging Results:  Relevant Lab Results:   Additional Information SS#: [id number]  Geralynn Ochs, LCSW

## 2021-02-23 NOTE — Progress Notes (Signed)
STROKE TEAM PROGRESS NOTE   SUBJECTIVE (INTERVAL HISTORY) Ms. Dorothy Barker was in the restroom .neurological exam and speech seem unchanged.  She remains aphasic.  She was cleared for dysphagia 2 diet.   2D echo and carotid ultrasound are yet pending OBJECTIVE Vitals:   02/23/21 0007 02/23/21 0412 02/23/21 0756 02/23/21 1136  BP: (!) 161/76 (!) 165/72 (!) 147/52 (!) 164/79  Pulse: 84 98 87 75  Resp: 18 19 18 17   Temp: 98.5 F (36.9 C) 98.9 F (37.2 C) 99 F (37.2 C) 99 F (37.2 C)  TempSrc:  Oral Oral Oral  SpO2: 96% 100% 100% 97%  Weight:      Height:        CBC:  Recent Labs  Lab 02/21/21 2049 02/21/21 2053 02/22/21 0810  WBC 11.1*  --  9.3  NEUTROABS 6.8  --  6.2  HGB 11.4* 11.9* 10.9*  HCT 34.7* 35.0* 33.3*  MCV 89.4  --  89.8  PLT 244  --  034    Basic Metabolic Panel:  Recent Labs  Lab 02/21/21 2049 02/21/21 2053 02/22/21 0810  NA 134* 135 134*  K 3.4* 3.4* 3.5  CL 101 101 99  CO2 22  --  26  GLUCOSE 294* 283* 206*  BUN 26* 27* 23  CREATININE 1.53* 1.50* 1.28*  CALCIUM 10.1  --  9.9    Lipid Panel:  Recent Labs  Lab 02/22/21 0311  CHOL 126  TRIG 136  HDL 43  CHOLHDL 2.9  VLDL 27  LDLCALC 56   HgbA1c:  Lab Results  Component Value Date   HGBA1C 8.4 (H) 02/22/2021   Urine Drug Screen: No results found for: LABOPIA, COCAINSCRNUR, LABBENZ, AMPHETMU, THCU, LABBARB  Alcohol Level     Component Value Date/Time   ETH <10 02/22/2021 0308    IMAGING  Results for orders placed or performed during the hospital encounter of 02/21/21  MR BRAIN WO CONTRAST   Narrative   CLINICAL DATA:  Encephalopathy and aphasia  EXAM: MRI HEAD WITHOUT CONTRAST  TECHNIQUE: Multiplanar, multiecho pulse sequences of the brain and surrounding structures were obtained without intravenous contrast.  COMPARISON:  02/07/2021  FINDINGS: Brain: Small acute infarct of the posterior left insula. There are a few scattered punctate foci of acute ischemia in the posterior  left MCA territory, near the parietal temporal junction. Hemosiderin deposition at the site of old right basal ganglia infarct. No acute hemorrhage. There is multifocal hyperintense T2-weighted signal within the white matter. Generalized volume loss without a clear lobar predilection. The midline structures are normal.  Vascular: Major flow voids are preserved.  Skull and upper cervical spine: Normal calvarium and skull base. Visualized upper cervical spine and soft tissues are normal.  Sinuses/Orbits:No paranasal sinus fluid levels or advanced mucosal thickening. No mastoid or middle ear effusion. Left maxillary retention cyst normal orbits.  IMPRESSION: 1. Small acute infarct of the posterior left insula. No hemorrhage or mass effect. 2. A few scattered punctate foci of acute ischemia in the posterior left MCA territory, near the parietal temporal junction. 3. Old right basal ganglia infarct and findings of chronic ischemic microangiopathy.   Electronically Signed   By: Ulyses Jarred M.D.   On: 02/21/2021 23:11   Results for orders placed or performed during the hospital encounter of 02/06/21  CT Head Wo Contrast   Narrative   CLINICAL DATA:  Fall  EXAM: CT HEAD WITHOUT CONTRAST  CT CERVICAL SPINE WITHOUT CONTRAST  TECHNIQUE: Multidetector CT imaging of the  head and cervical spine was performed following the standard protocol without intravenous contrast. Multiplanar CT image reconstructions of the cervical spine were also generated.  COMPARISON:  None.  FINDINGS: CT HEAD FINDINGS  Brain: Area of edema in the left cerebellum measures approximately 4.0 x 3.5 cm. There is an old infarct of the right basal ganglia. There is periventricular hypoattenuation compatible with chronic microvascular disease. There is generalized atrophy without lobar predilection. No acute hemorrhage  Vascular: No abnormal hyperdensity of the major intracranial arteries or dural  venous sinuses. No intracranial atherosclerosis.  Skull: The visualized skull base, calvarium and extracranial soft tissues are normal.  Sinuses/Orbits: No fluid levels or advanced mucosal thickening of the visualized paranasal sinuses. No mastoid or middle ear effusion. The orbits are normal.  CT CERVICAL SPINE FINDINGS  Alignment: No static subluxation. Facets are aligned. Occipital condyles are normally positioned.  Skull base and vertebrae: No acute fracture.  Soft tissues and spinal canal: No prevertebral fluid or swelling. No visible canal hematoma.  Disc levels: Large central disc protrusion at C4-5 indents the ventral spinal cord. Small central disc protrusions also at C2-3 and C3-4 narrows the ventral thecal sac.  Upper chest: No pneumothorax, pulmonary nodule or pleural effusion.  Other: Normal visualized paraspinal cervical soft tissues.  IMPRESSION: 1. Area of edema in the left cerebellum without associated mass effect or acute hemorrhage, most likely a subacute infarct. MRI of the brain with and without contrast is recommended for further characterization and to exclude neoplasm. 2. No acute fracture or static subluxation of the cervical spine. 3. 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   Electronically Signed   By: Ulyses Jarred M.D.   On: 02/06/2021 20:01     PHYSICAL EXAM  General exam   Pleasant elderly Caucasian lady not in distress  HEENT-  Normocephalic, no lesions, without obvious abnormality.  Normal external eye and conjunctiva.   Cardiovascular- RRR Lungs-Breathing comfortably on room air Abdomen- Soft Musculoskeletal-no joint tenderness, deformity or swelling Skin-warm and dry, no hyperpigmentation, vitiligo, or suspicious lesions  Neurologic Exam: Mental Status: Alert.  Globally aphasic.  Does not follow any commands including midline.  Nonfluent speech but occasionally can speak clear sentences and at  times speech becomes gibberish and difficult to understand. Cranial Nerves:  II:  Pupils equal, round. She clearly looked at the provider speaking to her. III,IV, VI: Ptosis not present, extra-ocular motions intact bilaterally V,VII: Face symmetric VIII: Hearing assumed to be normal bilaterally IX,X: Unable to assess XI: Unable to assess XII: Unable to assess Motor: Upper extremities moved antigravity bilaterally. Ankle and plantar flexion 3/5. Tone and bulk:normal tone throughout; no atrophy noted Sensory: Unable to assess Cerebellar: Unable to assess Gait: Deferred    ASSESSMENT/PLAN Dorothy Barker is a 80 y.o. female with a past medical history significant for atrial flutter (on dabigatran), diabetes, hyperlipidemia, CKD, strokes including recent large acute left cerebellar stroke in June 2022 who presents from her facility as a stroke code for global aphasia and confusion on 04Jul2022. Imaging on arrival showed severe stenosis of the left MCA mid M1 segment and proximal M2 segment; moderate-to-severe stenosis of the distal right M1 segment as well as a small acute infarct of the posterior left insula and a few scattered punctate foci of acute ischemia in the posterior left MCA territory.  # Stroke: Small acute infarct of the posterior left insula and a few scattered punctate foci of acute ischemia in the posterior left  MCA territory likely secondary to severe intracranial multivessel atherosclerosis as well as atrial flutter Unfortunately, pradaxa has failed Ms. Doyle Askew. The distribution of infarcts likely explains her aphasia. It is possible that she is encephalopathic secondary to the UTI, but labs (B12, folate, TSH, ammonia) right now do not support this. Will continue with stroke workup and monitor.  Carotid Doppler pending 2D Echo  pending LDL 56 HgbA1c 8.4 Currently on dabigatran for anticoagulation - continue for now Ongoing aggressive stroke risk factor  management PT/OT/SLP Disposition: When medically appropriate, pending PT/OT recs  Hypertension Bps 140s-170s/70-80s; Continue metoprolol 50mg  Permissive hypertension (OK if < 220/120) but gradually normalize in 5-7 days Long-term BP goal normotensive  Hyperlipidemia Lisinopril 30mg  daily at home; lipitor 40mg  in the hospital LDL 56, goal < 70 Continue statin at discharge  Diabetes type II HgbA1c 8.4, goal < 7.0 Continue SSI  UTI Urine culture pending No antibiotics ordered right now. Will defer antibiotic decision to medicine service  Hyperthyroid TSH 1.686 Continue armour thyroid  Hospital day # 2   Patient has presented with aphasia secondary to left MCA branch embolic infarct from underlying atrial flutter as well as severe intracranial atherosclerosis.  She was on Pradaxa which has failed.  Recommend adding aspirin 81 mg daily given severe underlying intracranial atherosclerosis as well.  This will lead to increased risk of bleeding but given failure of Pradaxa it may be worth the risk.  No family available at bedside for discussion.  Discussed with Dr. Cyndia Skeeters greater than 50% time during this 25-minute visit was spent in counseling and coordination of care about his stroke and development of evaluation treatment plan and answering questions. Stroke team will sign off.  Kindly call for questions. Antony Contras, MD Medical Director [place] Pager: [phone] 02/23/2021 12:37 PM  To contact Stroke Continuity provider, please refer to [email or web address] After hours, contact General Neurology

## 2021-02-23 NOTE — Consult Note (Signed)
[facility] Nurse Consult Note: Reason for Consult: Consult requested for left heel.  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 Pressure Injury POA: Yes Measurement: 1X2.5X.2cm Dressing procedure/placement/frequency: Topical treatment orders provided for bedside nurses to perform as follows to assist with enzymatic debridement: Apply Santyl to left heel wound Q day, then cover with moist 2X2 and foam dressing, and replace foam heel cover and mesh to hold in place. Please re-consult if further assistance is needed.  Thank-you,  Julien Girt MSN, [place], [place], [employer], [place]

## 2021-02-23 NOTE — Progress Notes (Signed)
PROGRESS NOTE  Dorothy Barker XNA:[id number] DOB: 07-08-1941   PCP: Earmon Phoenix, NP  Patient is from: ALF.  Uses wheelchair for most part but walks some distance with therapy.  DOA: 02/21/2021 LOS: 2  Chief complaints:  Chief Complaint  Patient presents with   Code Stroke     Brief Narrative / Interim history: 80 year old F with PMH of HTN, IDDM-2, a flutter on Pradaxa, PAD, multiple recent CVAs, cognitive impairment, HLD, pressure skin injury of bilateral heels and ambulatory dysfunction brought to ED with expressive aphasia and found to have acute left posterior insular stroke with scattered punctate foci of acute ischemia in posterior left MCA territory, near the parietal temporal junction and old right BG infarct.  CTA head and neck without emergent LVO but mild progression of severe stenosis of the left MCA mid M1 segment and proximal M2 segment, unchanged moderate to severe stenosis of the distal right M1 segment and unchanged severe stenosis of the left PCA P2 segment.  Per neurology, concern about Pradaxa failure, I recommended adding low-dose aspirin.  Subjective: Seen and examined earlier this morning.  No major events overnight of this morning.  Patient's daughter and son-in-law at bedside.  Patient seems to neglect her right side.  She is awake but does not converse or follow commands.  Does not appear to be in distress.  Objective: Vitals:   02/23/21 0007 02/23/21 0412 02/23/21 0756 02/23/21 1136  BP: (!) 161/76 (!) 165/72 (!) 147/52 (!) 164/79  Pulse: 84 98 87 75  Resp: 18 19 18 17   Temp: 98.5 F (36.9 C) 98.9 F (37.2 C) 99 F (37.2 C) 99 F (37.2 C)  TempSrc:  Oral Oral Oral  SpO2: 96% 100% 100% 97%  Weight:      Height:        Intake/Output Summary (Last 24 hours) at 02/23/2021 1544 Last data filed at 02/23/2021 0800 Gross per 24 hour  Intake 112.3 ml  Output 450 ml  Net -337.7 ml   Filed Weights   02/21/21 2156  Weight: 78.9 kg    Examination:  GENERAL:  No apparent distress.  Nontoxic. HEENT: MMM.  Vision and hearing grossly intact.  NECK: Supple.  No apparent JVD.  RESP: On RA.  No IWOB.  Fair aeration bilaterally. CVS:  RRR. Heart sounds normal.  ABD/GI/GU: BS+. Abd soft, NTND.  MSK/EXT:  Moves extremities. No apparent deformity. No edema.  SKIN: no apparent skin lesion or wound NEURO: Awake.  Not able to assess orientation.  Does not follows command.  Does not converse.  PERRL. PSYCH: Calm. Normal affect.   Procedures:  None  Microbiology summarized: COVID-19 PCR positive but was positive on 6/19 Urine culture negative.  Assessment & Plan: Acute Ischemic Stroke-presented with expressive aphasia.  MRI brain with small acute infarct of the posterior L insula, few scattered punctate foci of acute ischemia in the posterior L MCA territory, near the parietal temporal junction, old right basal ganglia infarct and findings of chronic ischemic microangiopathy.  CTA neck and head with multiple relatively stable intracranial stenosis.  LDL 56.  A1c 8.4.  Concern about Pradaxa failure. -Discontinue D5 containing fluid -Follow TTE -Pradaxa and aspirin per neurology recommendation. -Continue high intensity statin. -Continue PT/OT  Dysphagia-likely due to #1. -Dysphagia 2 diet per SLP   Acute Metabolic Encephalopathy in patient with cognitive impairment-multifactorial including CVA, recent COVID-19 infection and underlying cognitive impairment.  UA, folate, TSH, ammonia and urine culture unrevealing.  B12 low side of normal. -Treat  treatable causes -Reorientation and delirium precautions. -Avoid/minimize sedating medications   COVID positive-was positive on 6/19 with mild/asymptomatic disease. -She is outside the 10 days window for isolation precaution.   Abnormal UA  Microscopic Hematuria -Follow repeat outpatient   Hypertension: BP slightly elevated. -Neurology recommended permissive hypertension after 220/110, and normalizing over  5 to 6 days   Hypothyroidism -Continue armour thyroid   Atrial Flutter: Rate controlled. -Continue metoprolol and Pradaxa   Uncontrolled IDDM-2 with hyperglycemia: A1c 8.4%.  Hyperglycemia likely due to dextrose. Recent Labs  Lab 02/22/21 1344 02/22/21 1637 02/22/21 2146 02/23/21 0651 02/23/21 1257  GLUCAP 107* 123* 128* 194* 177*  -Discontinue D5-LR -Continue current insulin regimen  Debility/ambulatory dysfunction -PT/OT.  Goal of care counseling: Patient with extensive comorbidities as above.  Still full code.  I discussed my concern about the implication of aggressive measures such as CPR/intubation with patient's daughter and son-in-law at the bedside.  I recommended DNR/DNI.  Family undecided about this, and would like to think about it more. -Palliative consulted  Class I obesity Body mass index is 33.97 kg/m.       Pressure Injury 02/07/21 Heel Left Unstageable - Full thickness tissue loss in which the base of the injury is covered by slough (yellow, tan, gray, green or brown) and/or eschar (tan, brown or black) in the wound bed. (Active)  02/07/21 1222  Location: Heel  Location Orientation: Left  Staging: Unstageable - Full thickness tissue loss in which the base of the injury is covered by slough (yellow, tan, gray, green or brown) and/or eschar (tan, brown or black) in the wound bed.  Wound Description (Comments):   Present on Admission: Yes   DVT prophylaxis:  SCD's Start: 02/22/21 0231 dabigatran (PRADAXA) capsule 150 mg  Code Status: Full code Family Communication: Patient and/or RN.  Updated patient's daughter and son-in-law at bedside. Level of care: Telemetry Medical Status is: Inpatient  Remains inpatient appropriate because:Altered mental status, Ongoing diagnostic testing needed not appropriate for outpatient work up, Unsafe d/c plan, and Inpatient level of care appropriate due to severity of illness  Dispo: The patient is from: ALF               Anticipated d/c is to: SNF              Patient currently is not medically stable to d/c.   Difficult to place patient No       Consultants:  Neurology   Sch Meds:  Scheduled Meds:   stroke: mapping our early stages of recovery book   Does not apply Once   aspirin EC  81 mg Oral Daily   atorvastatin  40 mg Oral QHS   collagenase   Topical Daily   dabigatran  150 mg Oral Q12H   insulin aspart  0-9 Units Subcutaneous TID WC   insulin glargine  15 Units Subcutaneous QHS   metoprolol tartrate  50 mg Oral BID   pantoprazole (PROTONIX) IV  40 mg Intravenous QHS   thyroid  60 mg Oral QAC breakfast   Continuous Infusions:  heparin 750 Units/hr (02/22/21 2300)   PRN Meds:.acetaminophen **OR** acetaminophen (TYLENOL) oral liquid 160 mg/5 mL **OR** acetaminophen, ondansetron (ZOFRAN) IV, polyethylene glycol  Antimicrobials: Anti-infectives (From admission, onward)    None        I have personally reviewed the following labs and images: CBC: Recent Labs  Lab 02/21/21 2049 02/21/21 2053 02/22/21 0810 02/23/21 1338  WBC 11.1*  --  9.3 9.1  NEUTROABS 6.8  --  6.2 5.7  HGB 11.4* 11.9* 10.9* 11.1*  HCT 34.7* 35.0* 33.3* 33.4*  MCV 89.4  --  89.8 88.6  PLT 244  --  221 230   BMP &GFR Recent Labs  Lab 02/21/21 2049 02/21/21 2053 02/22/21 0810 02/23/21 1338  NA 134* 135 134* 137  K 3.4* 3.4* 3.5 3.4*  CL 101 101 99 103  CO2 22  --  26 26  GLUCOSE 294* 283* 206* 154*  BUN 26* 27* 23 20  CREATININE 1.53* 1.50* 1.28* 1.31*  CALCIUM 10.1  --  9.9 9.8  MG  --   --   --  1.6*  PHOS  --   --   --  3.1   Estimated Creatinine Clearance: 31.3 mL/min (A) (by C-G formula based on SCr of 1.31 mg/dL (H)). Liver & Pancreas: Recent Labs  Lab 02/21/21 2049 02/22/21 0810 02/23/21 1338  AST 20 19 20   ALT 18 15 17   ALKPHOS 64 57 58  BILITOT 0.6 0.9 0.9  PROT 6.4* 5.8* 6.0*  ALBUMIN 3.0* 3.0* 2.9*   No results for input(s): LIPASE, AMYLASE in the last 168 hours. Recent  Labs  Lab 02/22/21 0307  AMMONIA 21   Diabetic: Recent Labs    02/22/21 0311  HGBA1C 8.4*   Recent Labs  Lab 02/22/21 1344 02/22/21 1637 02/22/21 2146 02/23/21 0651 02/23/21 1257  GLUCAP 107* 123* 128* 194* 177*   Cardiac Enzymes: No results for input(s): CKTOTAL, CKMB, CKMBINDEX, TROPONINI in the last 168 hours. No results for input(s): PROBNP in the last 8760 hours. Coagulation Profile: Recent Labs  Lab 02/21/21 2049  INR 1.7*   Thyroid Function Tests: Recent Labs    02/22/21 0309  TSH 1.686   Lipid Profile: Recent Labs    02/22/21 0311  CHOL 126  HDL 43  LDLCALC 56  TRIG 136  CHOLHDL 2.9   Anemia Panel: Recent Labs    02/22/21 0308 02/22/21 0309  VITAMINB12  --  308  FOLATE 16.3  --    Urine analysis:    Component Value Date/Time   COLORURINE STRAW (A) 02/21/2021 2151   APPEARANCEUR CLEAR 02/21/2021 2151   LABSPEC 1.011 02/21/2021 2151   PHURINE 6.0 02/21/2021 2151   GLUCOSEU >=500 (A) 02/21/2021 2151   HGBUR MODERATE (A) 02/21/2021 2151   [place] NEGATIVE 02/21/2021 2151   [place] NEGATIVE 02/21/2021 2151   PROTEINUR 100 (A) 02/21/2021 2151   NITRITE NEGATIVE 02/21/2021 2151   LEUKOCYTESUR NEGATIVE 02/21/2021 2151   Sepsis Labs: Invalid input(s): PROCALCITONIN, [place]  Microbiology: Recent Results (from the past 240 hour(s))  Urine Culture     Status: None   Collection Time: 02/21/21  9:51 PM   Specimen: Urine, Random  Result Value Ref Range Status   Specimen Description URINE, RANDOM  Final   Special Requests NONE  Final   Culture   Final    NO GROWTH Performed at [facility], [address], [place], [place] [address]    Report Status 02/23/2021 FINAL  Final  SARS CORONAVIRUS 2 (TAT 6-24 HRS) Nasopharyngeal Nasopharyngeal Swab     Status: Abnormal   Collection Time: 02/22/21 12:59 PM   Specimen: Nasopharyngeal Swab  Result Value Ref Range Status   SARS Coronavirus 2 POSITIVE (A) NEGATIVE Final    Comment:  (NOTE) SARS-CoV-2 target nucleic acids are DETECTED.  The SARS-CoV-2 RNA is generally detectable in upper and lower respiratory specimens during the acute phase of infection. Positive results  are indicative of the presence of SARS-CoV-2 RNA. Clinical correlation with patient history and other diagnostic information is  necessary to determine patient infection status. Positive results do not rule out bacterial infection or co-infection with other viruses.  The expected result is Negative.  Fact Sheet for Patients: SugarRoll.be  Fact Sheet for Healthcare Providers: https://www.woods-mathews.com/  This test is not yet approved or cleared by the [place] FDA and  has been authorized for detection and/or diagnosis of SARS-CoV-2 by FDA under an Emergency Use Authorization (EUA). This EUA will remain  in effect (meaning this test can be used) for the duration of the COVID-19 declaration under Section 564(b)(1) of the Act, 21 U. S.C. section 360bbb-3(b)(1), unless the authorization is terminated or revoked sooner.   Performed at [facility], [place] [address] [place], [place] [address]     Radiology Studies: No results found.   Monette Omara T. [place]  If 7PM-7AM, please contact night-coverage [email or web address] 02/23/2021, 3:44 PM

## 2021-02-23 NOTE — Evaluation (Signed)
Occupational Therapy Evaluation Patient Details Name: Dorothy Barker MRN: [id number] DOB: December 08, 1940 Today's Date: 02/23/2021    History of Present Illness 80 y/o female presented to ED on 7/5 from facility with global aphasia and confusion. MRI demonstrated small acute infarct in L posterior insula with scattered punctate foci of acute ischemia in posterior L MCA territory. PMH: HLD, HTN, DM type 2, hypothyroidism, atrial flutter, PVD, multiple recent stroke (08/2020 and 02/07/21)   Clinical Impression   Patient admitted for the diagnosis above. PTA she lives at an ALF, and has assist as needed for ADL and mobility support.  She has been limited by a heel would which only allows weight bearing for transfers only.  Barrers are listed below.  Currently she is unable to fully participate with all aspects of the OT eval.  She is needing +2 for any attempted mobility, and up to Max A at bed level for any ADL completion.  SNF is recommended for post acute rehab in order to ascertain whether she could return to ALF, or need SNF level of care.  OT will see her in the acute setting to maximize her functional status.     Follow Up Recommendations  SNF    Equipment Recommendations  Wheelchair (measurements OT);Wheelchair cushion (measurements OT)    Recommendations for Other Services       Precautions / Restrictions Precautions Precautions: Fall;Other (comment) Precaution Comments: WB through ball of L foot in darco shoe (no WBing through L heel due to wound) Required Braces or Orthoses: Other Brace Other Brace: darco shoe in room Restrictions Weight Bearing Restrictions: Yes LLE Weight Bearing: Non weight bearing Other Position/Activity Restrictions: L heel offloading shoe      Mobility Bed Mobility Overal bed mobility: Needs Assistance Bed Mobility: Rolling Rolling: Max assist           Patient Response: Flat affect  Transfers                 General transfer comment: NT +2  assist.    Balance                                           ADL either performed or assessed with clinical judgement   ADL Overall ADL's : Needs assistance/impaired     Grooming: Moderate assistance;Bed level           Upper Body Dressing : Moderate assistance;Bed level   Lower Body Dressing: Maximal assistance;Bed level       Toileting- Clothing Manipulation and Hygiene: Bed level;Maximal assistance               Vision         Perception     Praxis      Pertinent Vitals/Pain Pain Assessment: Faces Faces Pain Scale: No hurt Pain Intervention(s): Monitored during session     Hand Dominance Right   Extremity/Trunk Assessment Upper Extremity Assessment Upper Extremity Assessment: Generalized weakness   Lower Extremity Assessment Lower Extremity Assessment: Defer to PT evaluation       Communication Communication Communication: Receptive difficulties;Expressive difficulties   Cognition Arousal/Alertness: Awake/alert Behavior During Therapy: Flat affect Overall Cognitive Status: Difficult to assess Area of Impairment: Attention;Following commands;Awareness                   Current Attention Level: Focused   Following Commands: Follows one step commands inconsistently  Awareness: Intellectual       General Comments       Exercises     Shoulder Instructions      Home Living Family/patient expects to be discharged to:: Assisted living     Type of Home: Assisted living                       Home Equipment: Gilford Rile - 2 wheels;Wheelchair - manual   Additional Comments: Per family/chart: transfers only sine 10/2020 due to L heel wound      Prior Functioning/Environment Level of Independence: Needs assistance  Gait / Transfers Assistance Needed: transfers only due to L heel wound. Per daughter, able to t/f without assistance ADL's / Homemaking Assistance Needed: total A for bathing/dressing via  staff, ALF has cafeteria; varies from a little bit of help to a lot of help for feeding            OT Problem List: Decreased strength;Decreased activity tolerance;Impaired balance (sitting and/or standing);Decreased knowledge of precautions;Decreased knowledge of use of DME or AE;Decreased safety awareness;Decreased cognition      OT Treatment/Interventions: Self-care/ADL training;DME and/or AE instruction;Therapeutic activities;Patient/family education;Balance training;Therapeutic exercise;Neuromuscular education    OT Goals(Current goals can be found in the care plan section) Acute Rehab OT Goals Patient Stated Goal: None stated OT Goal Formulation: Patient unable to participate in goal setting Time For Goal Achievement: 2021/04/06 Potential to Achieve Goals: Fair ADL Goals Pt Will Perform Eating: with mod assist;sitting Pt Will Perform Upper Body Bathing: with mod assist;sitting Pt Will Perform Upper Body Dressing: with mod assist;sitting Pt Will Transfer to Toilet: with min assist;with +2 assist;bedside commode;stand pivot transfer  OT Frequency: Min 2X/week   Barriers to D/C: Decreased caregiver support  Will need higher level of assist       Co-evaluation              AM-PAC OT "6 Clicks" Daily Activity     Outcome Measure Help from another person eating meals?: A Lot Help from another person taking care of personal grooming?: A Lot Help from another person toileting, which includes using toliet, bedpan, or urinal?: Total Help from another person bathing (including washing, rinsing, drying)?: A Lot Help from another person to put on and taking off regular upper body clothing?: A Lot Help from another person to put on and taking off regular lower body clothing?: A Lot 6 Click Score: 11   End of Session    Activity Tolerance: Patient limited by lethargy Patient left: in bed;with bed alarm set  OT Visit Diagnosis: Muscle weakness (generalized) (M62.81);Other  symptoms and signs involving cognitive function                Time: 1253-1309 OT Time Calculation (min): 16 min Charges:  OT General Charges $OT Visit: 1 Visit OT Evaluation $OT Eval Moderate Complexity: 1 Mod  02/23/2021  Rich, OTR/L  Acute Rehabilitation Services  Office:  [place] 02/23/2021, 2:06 PM

## 2021-02-23 NOTE — Progress Notes (Signed)
  Echocardiogram 2D Echocardiogram has been performed.  Cammy Brochure 02/23/2021, 12:49 PM

## 2021-02-23 NOTE — Evaluation (Signed)
Physical Therapy Evaluation Patient Details Name: Dorothy Barker MRN: [id number] DOB: [birth year] Today's Date: 02/23/2021   History of Present Illness  80 y/o female presented to ED on 7/5 from facility with global aphasia and confusion. MRI demonstrated small acute infarct in L posterior insula with scattered punctate foci of acute ischemia in posterior L MCA territory. PMH: HLD, HTN, DM type 2, hypothyroidism, atrial flutter, PVD, multiple recent stroke (08/2020 and 02/07/21)  Clinical Impression  PTA, patient lives at [facility] and was transfers only due to L heel wound and currently WB through ball of L foot during transfers. Patient currently presents with aphasia, impaired cognition, generalized weakness, decreased activity tolerance, and impaired balance. Patient required totalA-maxA for bed mobility and close supervision for static sitting balance. Deferred transfer due to lack of +2 assist. Patient following <25% of commands. Daughter present and supportive, feels patient will need SNF. Patient will benefit from skilled PT services during acute stay to address listed deficits. Recommend SNF following discharge to maximize functional mobility and safety.     Follow Up Recommendations SNF;Supervision/Assistance - 24 hour    Equipment Recommendations  None recommended by PT (has necessary DME)    Recommendations for Other Services       Precautions / Restrictions Precautions Precaution Comments: WB through ball of L foot in darco shoe (no WBing through L heel due to wound) Required Braces or Orthoses: Other Brace Other Brace: darco shoe in room Restrictions Weight Bearing Restrictions: Yes LLE Weight Bearing: Non weight bearing (WB through ball of L foot)      Mobility  Bed Mobility Overal bed mobility: Needs Assistance Bed Mobility: Supine to Sit;Sit to Supine     Supine to sit: Total assist Sit to supine: Max assist   General bed mobility comments: totalA to perform supine>sit due  to inability to follow commands. MaxA to return to supine with patient returning trunk to bed without assistance. Required assist for bringing LEs onto bed    Transfers                 General transfer comment: Deferred due to lack of +2 assist  Ambulation/Gait                Stairs            Wheelchair Mobility    Modified Rankin (Stroke Patients Only) Modified Rankin (Stroke Patients Only) Pre-Morbid Rankin Score: Moderately severe disability Modified Rankin: Severe disability     Balance Overall balance assessment: Needs assistance Sitting-balance support: Single extremity supported;Feet supported Sitting balance-Leahy Scale: Fair Sitting balance - Comments: required close supervision                                     Pertinent Vitals/Pain Pain Assessment: Faces Faces Pain Scale: Hurts little more Pain Location: generalized movement Pain Descriptors / Indicators: Grimacing;Guarding Pain Intervention(s): Monitored during session;Repositioned    Home Living Family/patient expects to be discharged to:: Assisted living               Home Equipment: Bristol Osentoski - 2 wheels;Wheelchair - manual Additional Comments: transfers only sine 10/2020 due to L heel wound    Prior Function Level of Independence: Needs assistance   Gait / Transfers Assistance Needed: transfers only due to L heel wound. Per daughter, able to t/f without assistance  ADL's / Homemaking Assistance Needed: totalA for bathing/dressing, ALF has cafeteria; varies  from a little bit of help to a lot of help        Hand Dominance        Extremity/Trunk Assessment   Upper Extremity Assessment Upper Extremity Assessment: Defer to OT evaluation    Lower Extremity Assessment Lower Extremity Assessment: Difficult to assess due to impaired cognition;Generalized weakness    Cervical / Trunk Assessment Cervical / Trunk Assessment: Kyphotic  Communication    Communication: No difficulties  Cognition Arousal/Alertness: Awake/alert Behavior During Therapy: Flat affect;Agitated Overall Cognitive Status: Difficult to assess Area of Impairment: Following commands                       Following Commands: Follows one step commands inconsistently       General Comments: Following <25% of commands and requiring physical assistance to attend to R side      General Comments      Exercises     Assessment/Plan    PT Assessment Patient needs continued PT services  PT Problem List Decreased strength;Decreased activity tolerance;Decreased balance;Decreased mobility;Decreased cognition;Decreased safety awareness       PT Treatment Interventions DME instruction;Functional mobility training;Therapeutic activities;Therapeutic exercise;Balance training;Patient/family education;Neuromuscular re-education;Wheelchair mobility training    PT Goals (Current goals can be found in the Care Plan section)  Acute Rehab PT Goals Patient Stated Goal: per daughter, get better PT Goal Formulation: With family Time For Goal Achievement: 03-28-2021 Potential to Achieve Goals: Fair    Frequency Min 3X/week   Barriers to discharge        Co-evaluation               AM-PAC PT "6 Clicks" Mobility  Outcome Measure Help needed turning from your back to your side while in a flat bed without using bedrails?: Total Help needed moving from lying on your back to sitting on the side of a flat bed without using bedrails?: Total Help needed moving to and from a bed to a chair (including a wheelchair)?: Total Help needed standing up from a chair using your arms (e.g., wheelchair or bedside chair)?: Total Help needed to walk in hospital room?: Total Help needed climbing 3-5 steps with a railing? : Total 6 Click Score: 6    End of Session   Activity Tolerance: Patient tolerated treatment well Patient left: in bed;with call bell/phone within reach;Other  (comment) (handoff to SLP) Nurse Communication: Mobility status PT Visit Diagnosis: Muscle weakness (generalized) (M62.81);Other abnormalities of gait and mobility (R26.89);Difficulty in walking, not elsewhere classified (R26.2);Other symptoms and signs involving the nervous system (B51.025)    Time: 8527-7824 PT Time Calculation (min) (ACUTE ONLY): 37 min   Charges:   PT Evaluation $PT Eval Moderate Complexity: 1 Mod PT Treatments $Therapeutic Activity: 23-37 mins        Graceland Wachter A. Gilford Rile PT, DPT Acute Rehabilitation Services Pager [phone] Office [phone]   Linna Hoff 02/23/2021, 9:46 AM

## 2021-02-23 NOTE — Progress Notes (Signed)
VASCULAR LAB    Carotid duplex has been performed.  See CV proc for preliminary results.   Dewel Lotter, RVT 02/23/2021, 5:55 PM

## 2021-02-23 NOTE — TOC Initial Note (Signed)
Transition of Care [facility]) - Initial/Assessment Note    Patient Details  Name: Dorothy Barker MRN: [id number] Date of Birth: Jan 09, 1941  Transition of Care [facility]) CM/SW Contact:    Geralynn Ochs, LCSW Phone Number: 02/23/2021, 3:48 PM  Clinical Narrative:          CSW noting per chart review that patient's daughter agreeable to SNF, faxed out referral. CSW met with daughter at the bedside to provide bed offers. Daughter asked about [facility] and [facility], CSW left messages with both facilities to look at referral and update on bed availability. Awaiting response. CSW to follow.         Expected Discharge Plan: Skilled Nursing Facility Barriers to Discharge: Continued Medical Work up, Ship broker   Patient Goals and CMS Choice Patient states their goals for this hospitalization and ongoing recovery are:: patient unable to participate in goal setting, only oriented to self CMS Medicare.gov Compare Post Acute Care list provided to:: Patient Represenative (must comment) Choice offered to / list presented to : Adult Children  Expected Discharge Plan and Services Expected Discharge Plan: [place] Choice: [place] arrangements for the past 2 months: [place]                                      Prior Living Arrangements/Services Living arrangements for the past 2 months: [place] Lives with:: Facility Resident Patient language and need for interpreter reviewed:: No Do you feel safe going back to the place where you live?: Yes      Need for Family Participation in Patient Care: Yes (Comment) Care giver support system in place?: Yes (comment) Current home services: Home RN Criminal Activity/Legal Involvement Pertinent to Current Situation/Hospitalization: No - Comment as needed  Activities of Daily Living Home Assistive Devices/Equipment: Gilford Rile (specify type) ADL Screening  (condition at time of admission) Patient's cognitive ability adequate to safely complete daily activities?: No Is the patient deaf or have difficulty hearing?: No Does the patient have difficulty seeing, even when wearing glasses/contacts?: No Does the patient have difficulty concentrating, remembering, or making decisions?: Yes Patient able to express need for assistance with ADLs?: No Does the patient have difficulty dressing or bathing?: Yes Independently performs ADLs?: No Communication: Dependent Is this a change from baseline?: Change from baseline, expected to last >3 days Dressing (OT): Dependent Is this a change from baseline?: Change from baseline, expected to last >3 days Grooming: Dependent Is this a change from baseline?: Change from baseline, expected to last >3 days Feeding: Dependent Is this a change from baseline?: Change from baseline, expected to last >3 days Bathing: Dependent Is this a change from baseline?: Change from baseline, expected to last >3 days Toileting: Dependent Is this a change from baseline?: Change from baseline, expected to last >3days In/Out Bed: Dependent Is this a change from baseline?: Change from baseline, expected to last >3 days Walks in Home: Dependent Is this a change from baseline?: Change from baseline, expected to last >3 days Does the patient have difficulty walking or climbing stairs?: Yes Weakness of Legs: Both Weakness of Arms/Hands: Both  Permission Sought/Granted Permission sought to share information with : Facility Sport and exercise psychologist, Family Supports Permission granted to share information with : Yes, Verbal Permission Granted  Share Information with NAME: Angela Nevin  Permission granted to share info w AGENCY: SNF  Permission granted to share info w Relationship: Daughter     Emotional Assessment Appearance:: Appears stated age Attitude/Demeanor/Rapport: Unable to Assess Affect (typically observed): Unable to  Assess Orientation: : Oriented to Self Alcohol / Substance Use: Not Applicable Psych Involvement: No (comment)  Admission diagnosis:  Stroke [facility]) [I63.9] Acute ischemic stroke [facility]) [I63.9] Patient Active Problem List   Diagnosis Date Noted   Pressure injury of skin 02/08/2021   Left upper lobe pulmonary nodule 02/07/2021   Acute ischemic stroke ([place]) 02/06/2021   Fall at home, initial encounter 02/06/2021   Essential hypertension 02/06/2021   Hypothyroidism 02/06/2021   Paroxysmal atrial flutter ([place]) 02/06/2021   Type 2 diabetes mellitus with stage 3b chronic kidney disease, with long-term current use of insulin ([place]) 02/06/2021   Hypokalemia 02/06/2021   Chronic kidney disease, stage 3b ([place]) 02/06/2021   Atrial flutter ([place]) 01/27/2021   Secondary hypercoagulable state ([place]) 01/27/2021   Decubitus ulcer of heel, left, unstageable ([place]) 10/18/2020   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   Venous insufficiency of both lower extremities 10/18/2020   Fall 09/14/2020   Renal cyst, right 07/26/2020   Numbness and tingling of foot 07/02/2017   Onychomycosis due to dermatophyte 12/29/2016   Other acquired hammer toe 01/03/2016   Hypothyroidism 03/03/2008   PCP:  Earmon Phoenix, NP Pharmacy:   [id number] [address]Phone: [phone]: [phone] [place]Phone: [phone]: [phone]    Social Determinants of Health (SDOH) Interventions    Readmission Risk Interventions No flowsheet data found.

## 2021-02-24 ENCOUNTER — Inpatient Hospital Stay (HOSPITAL_COMMUNITY): Payer: Medicare PPO

## 2021-02-24 ENCOUNTER — Encounter (HOSPITAL_BASED_OUTPATIENT_CLINIC_OR_DEPARTMENT_OTHER): Payer: Medicare PPO | Admitting: Internal Medicine

## 2021-02-24 DIAGNOSIS — E039 Hypothyroidism, unspecified: Secondary | ICD-10-CM | POA: Diagnosis not present

## 2021-02-24 DIAGNOSIS — N1832 Chronic kidney disease, stage 3b: Secondary | ICD-10-CM | POA: Diagnosis not present

## 2021-02-24 DIAGNOSIS — I10 Essential (primary) hypertension: Secondary | ICD-10-CM | POA: Diagnosis not present

## 2021-02-24 DIAGNOSIS — U071 COVID-19: Secondary | ICD-10-CM | POA: Diagnosis not present

## 2021-02-24 DIAGNOSIS — D638 Anemia in other chronic diseases classified elsewhere: Secondary | ICD-10-CM

## 2021-02-24 LAB — MAGNESIUM: Magnesium: 1.9 mg/dL (ref 1.7–2.4)

## 2021-02-24 LAB — RENAL FUNCTION PANEL
Albumin: 2.8 g/dL — ABNORMAL LOW (ref 3.5–5.0)
Anion gap: 9 (ref 5–15)
BUN: 24 mg/dL — ABNORMAL HIGH (ref 8–23)
CO2: 25 mmol/L (ref 22–32)
Calcium: 9.9 mg/dL (ref 8.9–10.3)
Chloride: 103 mmol/L (ref 98–111)
Creatinine, Ser: 1.52 mg/dL — ABNORMAL HIGH (ref 0.44–1.00)
GFR, Estimated: 34 mL/min — ABNORMAL LOW (ref >60)
Glucose, Bld: 118 mg/dL — ABNORMAL HIGH (ref 70–99)
Phosphorus: 3.6 mg/dL (ref 2.5–4.6)
Potassium: 3.8 mmol/L (ref 3.5–5.1)
Sodium: 137 mmol/L (ref 135–145)

## 2021-02-24 LAB — CBC
HCT: 33 % — ABNORMAL LOW (ref 36.0–46.0)
Hemoglobin: 11 g/dL — ABNORMAL LOW (ref 12.0–15.0)
MCH: 29.6 pg (ref 26.0–34.0)
MCHC: 33.3 g/dL (ref 30.0–36.0)
MCV: 88.9 fL (ref 80.0–100.0)
Platelets: 213 10*3/uL (ref 150–400)
RBC: 3.71 MIL/uL — ABNORMAL LOW (ref 3.87–5.11)
RDW: 12.2 % (ref 11.5–15.5)
WBC: 8.9 10*3/uL (ref 4.0–10.5)
nRBC: 0 % (ref 0.0–0.2)

## 2021-02-24 LAB — GLUCOSE, CAPILLARY
Glucose-Capillary: 134 mg/dL — ABNORMAL HIGH (ref 70–99)
Glucose-Capillary: 140 mg/dL — ABNORMAL HIGH (ref 70–99)
Glucose-Capillary: 143 mg/dL — ABNORMAL HIGH (ref 70–99)
Glucose-Capillary: 126 mg/dL — ABNORMAL HIGH (ref 70–99)
Glucose-Capillary: 90 mg/dL (ref 70–99)

## 2021-02-24 LAB — HEPARIN LEVEL (UNFRACTIONATED): Heparin Unfractionated: 0.12 IU/mL — ABNORMAL LOW (ref 0.30–0.70)

## 2021-02-24 MED ORDER — LACTATED RINGERS IV SOLN: INTRAVENOUS | Status: DC

## 2021-02-24 MED ORDER — HEPARIN (PORCINE) 25000 UT/250ML-% IV SOLN
1000.0000 [IU]/h | INTRAVENOUS | Status: DC
  Administered 2021-02-24: 900 [IU]/h via INTRAVENOUS
  Administered 2021-02-25 – 2021-02-26 (×2): 1050 [IU]/h via INTRAVENOUS
  Filled 2021-02-24 (×3): qty 250

## 2021-02-24 NOTE — Progress Notes (Addendum)
ANTICOAGULATION CONSULT NOTE - Follow Up Consult  Pharmacy Consult for Heparin (Pradaxa on hold) Indication: atrial fibrillation and stroke  Allergies  Allergen Reactions   Tramadol Other (See Comments)    Syncopal episodes    Amlodipine Besy-Benazepril Hcl Rash   Codeine Rash   Lotensin [Benazepril] Rash   Naproxen Rash    Patient Measurements: Height: 5' (152.4 cm) Weight: 78.9 kg (173 lb 15.1 oz) IBW/kg (Calculated) : 45.5 Heparin Dosing Weight: 63.5 kg  Vital Signs: Temp: 99.1 F (37.3 C) (07/07 0716) Temp Source: Oral (07/07 0716) BP: 134/53 (07/07 0716) Pulse Rate: 75 (07/07 0716)  Labs: Recent Labs    02/21/21 2049 02/21/21 2053 02/22/21 0810 02/23/21 1338 02/24/21 0116  HGB 11.4*   < > 10.9* 11.1* 11.0*  HCT 34.7*   < > 33.3* 33.4* 33.0*  PLT 244  --  221 230 213  APTT 58*  --   --   --   --   LABPROT 19.9*  --   --   --   --   INR 1.7*  --   --   --   --   HEPARINUNFRC  --   --   --  0.16*  --   CREATININE 1.53*   < > 1.28* 1.31* 1.52*   < > = values in this interval not displayed.    Estimated Creatinine Clearance: 27 mL/min (A) (by C-G formula based on SCr of 1.52 mg/dL (H)).  Assessment: 80 yr old woman admitted with aflutter and found to have acute ischemic stroke, having had another recent stroke on 02/07/21. Pt was taking Pradaxa 150 mg PO BID PTA. No thrombectomy or tPA since on Pradaxa. Pradaxa was resumed on admission and pt rec'd 1 dose at 0948 on 02/22/21. Subsequently was unable to take PO, and pharmacy was consulted to transition to IV heparin on 7/5 pm. Changed back to Pradaxa on 7/6 pm, but now back to IV heparin due to unable to take PO Pradaxa today. Last Pradaxa dose ~5pm on 7/6.  Plan no heparin boluses due to recent stroke, and low therapeutic goal.  Heparin level was 0.16 on 750 units/hr yesterday, then changed to Pradaxa. Some trouble with obtaining heparin labs on 7/6. Multiple venipuncture attempts before sample obtained.  Goal of  Therapy:  Heparin level 0.3-0.5 units/ml Monitor platelets by anticoagulation protocol: Yes   Plan:  Resume heparin drip at 900 units/hr Heparin level ~8 hrs after drip begins. Daily heparin level and CBC. Monitor for bleeding. Pradaxa on hold.  Will follow up for resuming when able.  Arty Baumgartner, [place] 02/24/2021,11:17 AM

## 2021-02-24 NOTE — Progress Notes (Signed)
ANTICOAGULATION CONSULT NOTE - Follow Up Consult  Pharmacy Consult for IV Heparin (Pradaxa on Hold) Indication: atrial fibrillation and stroke  Allergies  Allergen Reactions   Tramadol Other (See Comments)    Syncopal episodes    Amlodipine Besy-Benazepril Hcl Rash   Codeine Rash   Lotensin [Benazepril] Rash   Naproxen Rash    Patient Measurements: Height: 5' (152.4 cm) Weight: 78.9 kg (173 lb 15.1 oz) IBW/kg (Calculated) : 45.5 Heparin Dosing Weight: 63.5 kg  Vital Signs: Temp: 98.9 F (37.2 C) (07/07 1933) Temp Source: Oral (07/07 1933) BP: 124/52 (07/07 1933) Pulse Rate: 81 (07/07 1933)  Labs: Recent Labs    02/21/21 2049 02/21/21 2053 02/22/21 0810 02/23/21 1338 02/24/21 0116 02/24/21 1934  HGB 11.4*   < > 10.9* 11.1* 11.0*  --   HCT 34.7*   < > 33.3* 33.4* 33.0*  --   PLT 244  --  221 230 213  --   APTT 58*  --   --   --   --   --   LABPROT 19.9*  --   --   --   --   --   INR 1.7*  --   --   --   --   --   HEPARINUNFRC  --   --   --  0.16*  --  0.12*  CREATININE 1.53*   < > 1.28* 1.31* 1.52*  --    < > = values in this interval not displayed.    Estimated Creatinine Clearance: 27 mL/min (A) (by C-G formula based on SCr of 1.52 mg/dL (H)).  Assessment: 80 yr old woman admitted with aflutter and found to have acute ischemic stroke, having had another recent stroke on 02/07/21. Pt was taking Pradaxa 150 mg PO BID PTA (concern about Pradaxa failure). No thrombectomy or tPA since on Pradaxa. Pradaxa was resumed on admission and pt rec'd 1 dose at 0948 on 02/22/21. Subsequently, pt was unable to take PO, and pharmacy was consulted to transition to IV heparin on 7/5 PM. Pt was changed back to Pradaxa on 7/6 PM, but now back to IV heparin due to inability to take PO Pradaxa today. Last Pradaxa dose was at ~5 PM on 7/6.  Heparin level ~7 hrs after resuming heparin infusion at 900 units/hr was 0.12 units/ml, which is below the goal range for this pt. H/H 11.0/33.0, plt  213. Per evening RN, no bleeding observed; he reported that pt's IV is running fine now, but he had to change IV site due to IV beeping every few hrs when pt moves arm (he rec'd no report from day shift RN about any issues with IV).  Goal of Therapy:  Heparin level 0.3-0.5 units/ml Monitor platelets by anticoagulation protocol: Yes   Plan:  Increase heparin infusion to 1050 units/hr Check heparin level in 8 hrs Monitor daily heparin level, CBC Monitor for bleeding F/U long-term anticoagulation plan  Gillermina Hu, PharmD, BCPS, [facility] Clinical Pharmacist 02/24/2021,8:12 PM

## 2021-02-24 NOTE — Progress Notes (Signed)
PROGRESS NOTE  Dorothy Barker RXV:[id number] DOB: [birth year]   PCP: Earmon Phoenix, NP  Patient is from: ALF.  Uses wheelchair for most part but walks some distance with therapy.  DOA: 02/21/2021 LOS: 3  Chief complaints:  Chief Complaint  Patient presents with   Code Stroke     Brief Narrative / Interim history: 80 year old F with PMH of HTN, IDDM-2, a flutter on Pradaxa, PAD, multiple recent CVAs, cognitive impairment, HLD, pressure skin injury of bilateral heels and ambulatory dysfunction brought to ED with expressive aphasia and found to have acute left posterior insular stroke with scattered punctate foci of acute ischemia in posterior left MCA territory, near the parietal temporal junction and old right BG infarct.  CTA head and neck without emergent LVO but mild progression of severe stenosis of the left MCA mid M1 segment and proximal M2 segment, unchanged moderate to severe stenosis of the distal right M1 segment and unchanged severe stenosis of the left PCA P2 segment.  TTE and carotid US without significant finding.  Per neurology, concern about Pradaxa failure, and recommended adding low-dose aspirin.  Main issue is encephalopathy interfering with p.o. intake.   Subjective: Seen and examined earlier this morning.  No major events overnight of this morning.  Patient is somewhat sleepy and barely wakes up or follows commands.  Was not able to take some of a p.o. medication safely.  Patient's daughter at bedside.  She is concerned about patient's decline.   Objective: Vitals:   02/23/21 1136 02/23/21 2047 02/23/21 2337 02/24/21 0716  BP: (!) 164/79 (!) 142/67 (!) 152/65 (!) 134/53  Pulse: 75 86 82 75  Resp: 17 18 18 20   Temp: 99 F (37.2 C) 99.1 F (37.3 C) 99.1 F (37.3 C) 99.1 F (37.3 C)  TempSrc: Oral Oral Oral Oral  SpO2: 97% 94% 95% 96%  Weight:      Height:        Intake/Output Summary (Last 24 hours) at 02/24/2021 1209 Last data filed at 02/23/2021 1725 Gross per 24  hour  Intake 155.89 ml  Output --  Net 155.89 ml   Filed Weights   02/21/21 2156  Weight: 78.9 kg    Examination:  GENERAL: No apparent distress.  Nontoxic. HEENT: MMM.  Vision and hearing grossly intact.  NECK: Supple.  No apparent JVD.  RESP: On RA.  No IWOB.  Fair aeration bilaterally. CVS:  RRR. Heart sounds normal.  ABD/GI/GU: BS+. Abd soft, NTND.  MSK/EXT:  Moves extremities. No apparent deformity. No edema.  SKIN: Unstageable pressure skin injury of left heel. NEURO: Sleepy.  Does not follows command.  No facial symmetry.  Limited exam due to mental status. PSYCH: Calm. Normal affect.   Procedures:  None  Microbiology summarized: COVID-19 PCR positive but was positive on 6/19 Urine culture negative.  Assessment & Plan: Acute Ischemic Stroke-presented with expressive aphasia.  MRI brain with small acute infarct of the posterior L insula, few scattered punctate foci of acute ischemia in the posterior L MCA territory, near the parietal temporal junction, old right basal ganglia infarct and findings of chronic ischemic microangiopathy.  CTA neck and head with multiple relatively stable intracranial stenosis.  TTE and carotid US without significant finding.  LDL 56.  A1c 8.4.  Concern about Pradaxa failure. -Discontinue D5 containing fluid -Changing Pradaxa to heparin until she is able to take p.o. safely. -Continue low-dose aspirin.  May have to change to suppository -Continue high intensity statin. -Continue PT/OT  Acute Metabolic  Encephalopathy in patient with cognitive impairment-multifactorial including CVA, recent COVID-19 infection and underlying cognitive impairment.  UA, folate, TSH, ammonia and urine culture unrevealing.  B12 low side of normal. -Treat treatable causes -Reorientation and delirium precautions. -Avoid/minimize sedating medications -Gentle IV fluid for hydration  Dysphagia-likely due to #1.  SLP recommended dysphagia 2 diet on 7/6.  However,  concern about patient's ability to take p.o. safely today due to encephalopathy. -We will request SLP to reevaluate patient  COVID positive-was positive on 6/19 with mild/asymptomatic disease. -She is outside the 10 days window for isolation precaution.  CKD-3B/azotemia: Relatively stable. Recent Labs    01/14/21 1618 02/06/21 1919 02/06/21 1941 02/07/21 0831 02/08/21 0049 02/21/21 2049 02/21/21 2053 02/22/21 0810 02/23/21 1338 02/24/21 0116  BUN 33* 31* 32* 25* 30* 26* 27* 23 20 24*  CREATININE 1.34* 1.43* 1.30* 1.28* 1.36* 1.53* 1.50* 1.28* 1.31* 1.52*  -Gentle IV fluid as above.  Anemia of chronic disease: H&H relatively stable. Recent Labs    01/14/21 1618 02/06/21 1919 02/06/21 1941 02/07/21 0831 02/08/21 0049 02/21/21 2049 02/21/21 2053 02/22/21 0810 02/23/21 1338 02/24/21 0116  HGB 12.3 11.9* 12.2 11.2* 11.6* 11.4* 11.9* 10.9* 11.1* 11.0*    Microscopic hematuria -Repeat UA outpatient   Hypertension: BP within acceptable range. -Neurology recommended permissive hypertension after 220/110, and normalizing over 5 to 6 days   Hypothyroidism -Continue armour thyroid   Atrial Flutter: Rate controlled. -Continue metoprolol  -Anticoagulation as above   Uncontrolled IDDM-2 with hyperglycemia: A1c 8.4%.  Hyperglycemia likely due to dextrose. Recent Labs  Lab 02/23/21 1257 02/23/21 1614 02/23/21 2118 02/24/21 0627 02/24/21 0640  GLUCAP 177* 81 129* 134* 140*  -Discontinue D5-LR -Continue current insulin regimen  Debility/ambulatory dysfunction -PT/OT-recommended SNF  Goal of care counseling: again discussed about patient's decline with patient's daughter at bedside.  Patient's daughter seems to understand but very emotional and tearful.  -Palliative consulted on 7/6.  Class I obesity Body mass index is 33.97 kg/m.       Pressure skin injury: POA Pressure Injury 02/07/21 Heel Left Unstageable - Full thickness tissue loss in which the base of  the injury is covered by slough (yellow, tan, gray, green or brown) and/or eschar (tan, brown or black) in the wound bed. (Active)  02/07/21 1222  Location: Heel  Location Orientation: Left  Staging: Unstageable - Full thickness tissue loss in which the base of the injury is covered by slough (yellow, tan, gray, green or brown) and/or eschar (tan, brown or black) in the wound bed.  Wound Description (Comments):   Present on Admission: Yes   DVT prophylaxis:  SCD's Start: 02/22/21 0231  Code Status: Full code Family Communication: Patient and/or RN.  Updated patient's daughter at bedside. Level of care: Telemetry Medical Status is: Inpatient  Remains inpatient appropriate because:Altered mental status, Unsafe d/c plan, and Inpatient level of care appropriate due to severity of illness  Dispo: The patient is from: ALF              Anticipated d/c is to: SNF              Patient currently is not medically stable to d/c.   Difficult to place patient No       Consultants:  Neurology Palliative medicine   Sch Meds:  Scheduled Meds:   stroke: mapping our early stages of recovery book   Does not apply Once   aspirin EC  81 mg Oral Daily   atorvastatin  40 mg Oral QHS  collagenase   Topical Daily   insulin aspart  0-9 Units Subcutaneous TID WC   insulin glargine  15 Units Subcutaneous QHS   metoprolol tartrate  50 mg Oral BID   pantoprazole (PROTONIX) IV  40 mg Intravenous QHS   thyroid  60 mg Oral QAC breakfast   Continuous Infusions:  heparin     PRN Meds:.acetaminophen **OR** acetaminophen (TYLENOL) oral liquid 160 mg/5 mL **OR** acetaminophen, ondansetron (ZOFRAN) IV, polyethylene glycol  Antimicrobials: Anti-infectives (From admission, onward)    None        I have personally reviewed the following labs and images: CBC: Recent Labs  Lab 02/21/21 2049 02/21/21 2053 02/22/21 0810 02/23/21 1338 02/24/21 0116  WBC 11.1*  --  9.3 9.1 8.9  NEUTROABS 6.8  --   6.2 5.7  --   HGB 11.4* 11.9* 10.9* 11.1* 11.0*  HCT 34.7* 35.0* 33.3* 33.4* 33.0*  MCV 89.4  --  89.8 88.6 88.9  PLT 244  --  221 230 213   BMP &GFR Recent Labs  Lab 02/21/21 2049 02/21/21 2053 02/22/21 0810 02/23/21 1338 02/24/21 0116  NA 134* 135 134* 137 137  K 3.4* 3.4* 3.5 3.4* 3.8  CL 101 101 99 103 103  CO2 22  --  26 26 25   GLUCOSE 294* 283* 206* 154* 118*  BUN 26* 27* 23 20 24*  CREATININE 1.53* 1.50* 1.28* 1.31* 1.52*  CALCIUM 10.1  --  9.9 9.8 9.9  MG  --   --   --  1.6* 1.9  PHOS  --   --   --  3.1 3.6   Estimated Creatinine Clearance: 27 mL/min (A) (by C-G formula based on SCr of 1.52 mg/dL (H)). Liver & Pancreas: Recent Labs  Lab 02/21/21 2049 02/22/21 0810 02/23/21 1338 02/24/21 0116  AST 20 19 20   --   ALT 18 15 17   --   ALKPHOS 64 57 58  --   BILITOT 0.6 0.9 0.9  --   PROT 6.4* 5.8* 6.0*  --   ALBUMIN 3.0* 3.0* 2.9* 2.8*   No results for input(s): LIPASE, AMYLASE in the last 168 hours. Recent Labs  Lab 02/22/21 0307  AMMONIA 21   Diabetic: Recent Labs    02/22/21 0311  HGBA1C 8.4*   Recent Labs  Lab 02/23/21 1257 02/23/21 1614 02/23/21 2118 02/24/21 0627 02/24/21 0640  GLUCAP 177* 81 129* 134* 140*   Cardiac Enzymes: No results for input(s): CKTOTAL, CKMB, CKMBINDEX, TROPONINI in the last 168 hours. No results for input(s): PROBNP in the last 8760 hours. Coagulation Profile: Recent Labs  Lab 02/21/21 2049  INR 1.7*   Thyroid Function Tests: Recent Labs    02/22/21 0309  TSH 1.686   Lipid Profile: Recent Labs    02/22/21 0311  CHOL 126  HDL 43  LDLCALC 56  TRIG 136  CHOLHDL 2.9   Anemia Panel: Recent Labs    02/22/21 0308 02/22/21 0309  VITAMINB12  --  308  FOLATE 16.3  --    Urine analysis:    Component Value Date/Time   COLORURINE STRAW (A) 02/21/2021 2151   APPEARANCEUR CLEAR 02/21/2021 2151   LABSPEC 1.011 02/21/2021 2151   PHURINE 6.0 02/21/2021 2151   GLUCOSEU >=500 (A) 02/21/2021 2151    HGBUR MODERATE (A) 02/21/2021 2151   [place] NEGATIVE 02/21/2021 2151   [place] NEGATIVE 02/21/2021 2151   PROTEINUR 100 (A) 02/21/2021 2151   NITRITE NEGATIVE 02/21/2021 2151   LEUKOCYTESUR NEGATIVE 02/21/2021 2151  Sepsis Labs: Invalid input(s): PROCALCITONIN, [place]  Microbiology: Recent Results (from the past 240 hour(s))  Urine Culture     Status: None   Collection Time: 02/21/21  9:51 PM   Specimen: Urine, Random  Result Value Ref Range Status   Specimen Description URINE, RANDOM  Final   Special Requests NONE  Final   Culture   Final    NO GROWTH Performed at [facility], [address], [place], [place] [address]    Report Status 02/23/2021 FINAL  Final  SARS CORONAVIRUS 2 (TAT 6-24 HRS) Nasopharyngeal Nasopharyngeal Swab     Status: Abnormal   Collection Time: 02/22/21 12:59 PM   Specimen: Nasopharyngeal Swab  Result Value Ref Range Status   SARS Coronavirus 2 POSITIVE (A) NEGATIVE Final    Comment: (NOTE) SARS-CoV-2 target nucleic acids are DETECTED.  The SARS-CoV-2 RNA is generally detectable in upper and lower respiratory specimens during the acute phase of infection. Positive results are indicative of the presence of SARS-CoV-2 RNA. Clinical correlation with patient history and other diagnostic information is  necessary to determine patient infection status. Positive results do not rule out bacterial infection or co-infection with other viruses.  The expected result is Negative.  Fact Sheet for Patients: SugarRoll.be  Fact Sheet for Healthcare Providers: https://www.woods-mathews.com/  This test is not yet approved or cleared by the [place] FDA and  has been authorized for detection and/or diagnosis of SARS-CoV-2 by FDA under an Emergency Use Authorization (EUA). This EUA will remain  in effect (meaning this test can be used) for the duration of the COVID-19 declaration under Section  564(b)(1) of the Act, 21 U. S.C. section 360bbb-3(b)(1), unless the authorization is terminated or revoked sooner.   Performed at [facility], [place] [address], [place], [place] [address]     Radiology Studies: ECHOCARDIOGRAM COMPLETE  Result Date: 02/23/2021    ECHOCARDIOGRAM REPORT   Patient Name:   Dorothy Barker Date of Exam: 02/23/2021 Medical Rec #:  [id number]   Height:       60.0 in Accession #:    [id number]  Weight:       173.9 lb Date of Birth:  03/05/1941  BSA:          1.759 m Patient Age:    73 years    BP:           165/72 mmHg Patient Gender: F           HR:           98 bpm. Exam Location:  Inpatient Procedure: 2D Echo, Cardiac Doppler and Color Doppler Indications:    Stroke  History:        Patient has prior history of Echocardiogram examinations, most                 recent 02/07/2021. Risk Factors:Hypertension, Diabetes and                 Dyslipidemia.  Sonographer:    Cammy Brochure Referring Phys: [id number] [place]  1. Left ventricular ejection fraction, by estimation, is 60 to 65%. The left ventricle has normal function. The left ventricle has no regional wall motion abnormalities. There is mild concentric left ventricular hypertrophy. Left ventricular diastolic parameters are consistent with Grade I diastolic dysfunction (impaired relaxation).  2. Right ventricular systolic function is normal. The right ventricular size is normal. There is normal pulmonary artery systolic pressure.  3. Left atrial  size was mildly dilated.  4. Right atrial size was mildly dilated.  5. The mitral valve is abnormal. Mild mitral valve regurgitation. No evidence of mitral stenosis.  6. The aortic valve is tricuspid. There is mild calcification of the aortic valve. Aortic valve regurgitation is trivial. Mild aortic valve sclerosis is present, with no evidence of aortic valve stenosis.  7. The inferior vena cava is normal in size with greater than 50% respiratory variability,  suggesting right atrial pressure of 3 mmHg. Comparison(s): No significant change from prior study. Conclusion(s)/Recommendation(s): No intracardiac source of embolism detected on this transthoracic study. A transesophageal echocardiogram is recommended to exclude cardiac source of embolism if clinically indicated. FINDINGS  Left Ventricle: Left ventricular ejection fraction, by estimation, is 60 to 65%. The left ventricle has normal function. The left ventricle has no regional wall motion abnormalities. The left ventricular internal cavity size was normal in size. There is  mild concentric left ventricular hypertrophy. Left ventricular diastolic parameters are consistent with Grade I diastolic dysfunction (impaired relaxation). Right Ventricle: The right ventricular size is normal. Right vetricular wall thickness was not well visualized. Right ventricular systolic function is normal. There is normal pulmonary artery systolic pressure. The tricuspid regurgitant velocity is 2.58 m/s, and with an assumed right atrial pressure of 3 mmHg, the estimated right ventricular systolic pressure is 85.8 mmHg. Left Atrium: Left atrial size was mildly dilated. Right Atrium: Right atrial size was mildly dilated. Pericardium: There is no evidence of pericardial effusion. Presence of pericardial fat pad. Mitral Valve: The mitral valve is abnormal. There is moderate calcification of the mitral valve leaflet(s). Mild mitral valve regurgitation. No evidence of mitral valve stenosis. Tricuspid Valve: The tricuspid valve is normal in structure. Tricuspid valve regurgitation is mild. Aortic Valve: The aortic valve is tricuspid. There is mild calcification of the aortic valve. Aortic valve regurgitation is trivial. Mild aortic valve sclerosis is present, with no evidence of aortic valve stenosis. Aortic valve mean gradient measures 3.0 mmHg. Aortic valve peak gradient measures 5.5 mmHg. Aortic valve area, by VTI measures 2.50 cm. Pulmonic  Valve: The pulmonic valve was grossly normal. Pulmonic valve regurgitation is trivial. No evidence of pulmonic stenosis. Aorta: The aortic root, ascending aorta and aortic arch are all structurally normal, with no evidence of dilitation or obstruction. Venous: The inferior vena cava is normal in size with greater than 50% respiratory variability, suggesting right atrial pressure of 3 mmHg. IAS/Shunts: The interatrial septum appears to be lipomatous. The atrial septum is grossly normal.  LEFT VENTRICLE PLAX 2D LVIDd:         3.70 cm  Diastology LVIDs:         2.50 cm  LV e' medial:    3.70 cm/s LV PW:         1.10 cm  LV E/e' medial:  13.0 LV IVS:        1.50 cm  LV e' lateral:   5.44 cm/s LVOT diam:     1.80 cm  LV E/e' lateral: 8.9 LV SV:         56 LV SV Index:   32 LVOT Area:     2.54 cm  RIGHT VENTRICLE            IVC RV Basal diam:  4.20 cm    IVC diam: 0.70 cm RV S prime:     9.90 cm/s TAPSE (M-mode): 1.7 cm LEFT ATRIUM             Index  RIGHT ATRIUM           Index LA diam:        4.00 cm 2.27 cm/m  RA Area:     14.80 cm LA Vol (A2C):   50.1 ml 28.48 ml/m RA Volume:   35.80 ml  20.35 ml/m LA Vol (A4C):   45.5 ml 25.87 ml/m LA Biplane Vol: 48.0 ml 27.29 ml/m  AORTIC VALVE AV Area (Vmax):    2.39 cm AV Area (Vmean):   2.35 cm AV Area (VTI):     2.50 cm AV Vmax:           117.33 cm/s AV Vmean:          80.433 cm/s AV VTI:            0.223 m AV Peak Grad:      5.5 mmHg AV Mean Grad:      3.0 mmHg LVOT Vmax:         110.00 cm/s LVOT Vmean:        74.400 cm/s LVOT VTI:          0.219 m LVOT/AV VTI ratio: 0.98  AORTA Ao Root diam: 3.40 cm Ao Asc diam:  3.70 cm MITRAL VALVE               TRICUSPID VALVE MV Area (PHT): 3.17 cm    TR Peak grad:   26.6 mmHg MV Decel Time: 239 msec    TR Vmax:        258.00 cm/s MV E velocity: 48.20 cm/s MV A velocity: 99.00 cm/s  SHUNTS MV E/A ratio:  0.49        Systemic VTI:  0.22 m                            Systemic Diam: 1.80 cm Buford Dresser MD  Electronically signed by Buford Dresser MD Signature Date/Time: 02/23/2021/4:43:54 PM    Final    VAS US CAROTID  Result Date: 02/23/2021 Carotid Arterial Duplex Study Patient Name:  Dorothy Barker  Date of Exam:   02/23/2021 Medical Rec #: [id number]    Accession #:    [id number] Date of Birth: 1941/04/12   Patient Gender: F Patient Age:   79Y Exam Location:  [facility] Procedure:      VAS US CAROTID Referring Phys: [id number] Rentz --------------------------------------------------------------------------------  Indications:       CVA and Speech disturbance. Risk Factors:      Hypertension, hyperlipidemia, Diabetes, prior CVA. Comparison Study:  No prior study Performing Technologist: Sharion Dove RVS  Examination Guidelines: A complete evaluation includes B-mode imaging, spectral Doppler, color Doppler, and power Doppler as needed of all accessible portions of each vessel. Bilateral testing is considered an integral part of a complete examination. Limited examinations for reoccurring indications may be performed as noted.  Right Carotid Findings: +----------+--------+--------+--------+------------------+------------------+           PSV cm/sEDV cm/sStenosisPlaque DescriptionComments           +----------+--------+--------+--------+------------------+------------------+ CCA Prox  86      8                                 intimal thickening +----------+--------+--------+--------+------------------+------------------+ CCA Distal94      11  intimal thickening +----------+--------+--------+--------+------------------+------------------+ ICA Prox  65      16              heterogenous                         +----------+--------+--------+--------+------------------+------------------+ ICA Distal84      25                                                   +----------+--------+--------+--------+------------------+------------------+ ECA        168     14                                                   +----------+--------+--------+--------+------------------+------------------+ +----------+--------+-------+--------+-------------------+           PSV cm/sEDV cmsDescribeArm Pressure (mmHG) +----------+--------+-------+--------+-------------------+ Subclavian160                                        +----------+--------+-------+--------+-------------------+ +---------+--------+--+--------+-+ VertebralPSV cm/s47EDV cm/s7 +---------+--------+--+--------+-+  Left Carotid Findings: +----------+--------+--------+--------+---------------------+------------------+           PSV cm/sEDV cm/sStenosisPlaque Description   Comments           +----------+--------+--------+--------+---------------------+------------------+ CCA Prox  99      17                                   intimal thickening +----------+--------+--------+--------+---------------------+------------------+ CCA Distal92      18                                   intimal thickening +----------+--------+--------+--------+---------------------+------------------+ ICA Prox  147     12              calcific and                                                              irregular                               +----------+--------+--------+--------+---------------------+------------------+ ICA Mid   121     19                                                      +----------+--------+--------+--------+---------------------+------------------+ ICA Distal78      22                                                      +----------+--------+--------+--------+---------------------+------------------+  ECA       225     9                                                       +----------+--------+--------+--------+---------------------+------------------+ +----------+--------+--------+--------+-------------------+           PSV  cm/sEDV cm/sDescribeArm Pressure (mmHG) +----------+--------+--------+--------+-------------------+ Subclavian131                                         +----------+--------+--------+--------+-------------------+ +---------+--------+--+--------+--+ VertebralPSV cm/s69EDV cm/s16 +---------+--------+--+--------+--+   Summary: Right Carotid: Velocities in the right ICA are consistent with a 1-39% stenosis. Left Carotid: Velocities in the left ICA are consistent with a 1-39% stenosis. Vertebrals:  Bilateral vertebral arteries demonstrate antegrade flow. Subclavians: Normal flow hemodynamics were seen in bilateral subclavian              arteries. *See table(s) above for measurements and observations.     Preliminary      Amadeo Coke T. [place]  If 7PM-7AM, please contact night-coverage [email or web address] 02/24/2021, 12:09 PM

## 2021-02-24 NOTE — Progress Notes (Signed)
Physical Therapy Treatment Patient Details Name: Dorothy Barker MRN: [id number] DOB: 03-30-1941 Today's Date: 02/24/2021    History of Present Illness 80 y/o female presented to ED on 7/5 from facility with global aphasia and confusion. MRI demonstrated small acute infarct in L posterior insula with scattered punctate foci of acute ischemia in posterior L MCA territory. PMH: HLD, HTN, DM type 2, hypothyroidism, atrial flutter, PVD, multiple recent stroke (08/2020 and 02/07/21)    PT Comments    Session limited by lethargy. Patient opening eyes <30 seconds throughout session. When eyes open, patient with reaction to visual threat on L but no reaction on R. Patient able to move all extremities spontaneously but not on command. Patient required maxA to maintain sitting balance and with mild pushing noted with L UE. Patient grunting to pain but no verbalizations this session. Daughter present and supportive. Continue to recommend SNF for ongoing Physical Therapy.       Follow Up Recommendations  SNF;Supervision/Assistance - 24 hour     Equipment Recommendations  None recommended by PT    Recommendations for Other Services       Precautions / Restrictions Precautions Precautions: Fall;Other (comment) Precaution Comments: WB through ball of L foot in darco shoe (no WBing through L heel due to wound) Required Braces or Orthoses: Other Brace Other Brace: darco shoe in room Restrictions Weight Bearing Restrictions: Yes LLE Weight Bearing: Non weight bearing    Mobility  Bed Mobility Overal bed mobility: Needs Assistance Bed Mobility: Supine to Sit;Sit to Supine     Supine to sit: Total assist;+2 for physical assistance;+2 for safety/equipment Sit to supine: Total assist;+2 for physical assistance;+2 for safety/equipment   General bed mobility comments: totalA+2 for all aspects of bed mobility due to lethargy    Transfers                 General transfer comment: deferred due  to lethargy  Ambulation/Gait                 Stairs             Wheelchair Mobility    Modified Rankin (Stroke Patients Only) Modified Rankin (Stroke Patients Only) Pre-Morbid Rankin Score: Moderately severe disability Modified Rankin: Severe disability     Balance Overall balance assessment: Needs assistance Sitting-balance support: Single extremity supported;Feet supported Sitting balance-Leahy Scale: Poor Sitting balance - Comments: required maxA to maintain sitting balance. Mild pushing noted with L UE                                    Cognition Arousal/Alertness: Lethargic Behavior During Therapy: Flat affect Overall Cognitive Status: Difficult to assess                                 General Comments: lethargic throughout session. Opened eyes <30 seconds. Difficult to assess cognition this date      Exercises      General Comments        Pertinent Vitals/Pain Pain Assessment: Faces Faces Pain Scale: Hurts a little bit Pain Location: generalized movement Pain Descriptors / Indicators: Grimacing;Guarding Pain Intervention(s): Monitored during session;Repositioned    Home Living                      Prior Function  PT Goals (current goals can now be found in the care plan section) Acute Rehab PT Goals Patient Stated Goal: None stated PT Goal Formulation: With family Time For Goal Achievement: 03/22/2021 Potential to Achieve Goals: Fair Progress towards PT goals: Not progressing toward goals - comment    Frequency    Min 3X/week      PT Plan Current plan remains appropriate    Co-evaluation              AM-PAC PT "6 Clicks" Mobility   Outcome Measure  Help needed turning from your back to your side while in a flat bed without using bedrails?: Total Help needed moving from lying on your back to sitting on the side of a flat bed without using bedrails?: Total Help needed  moving to and from a bed to a chair (including a wheelchair)?: Total Help needed standing up from a chair using your arms (e.g., wheelchair or bedside chair)?: Total Help needed to walk in hospital room?: Total Help needed climbing 3-5 steps with a railing? : Total 6 Click Score: 6    End of Session   Activity Tolerance: Patient limited by lethargy Patient left: in bed;with call bell/phone within reach;with bed alarm set;with family/visitor present Nurse Communication: Mobility status PT Visit Diagnosis: Muscle weakness (generalized) (M62.81);Other abnormalities of gait and mobility (R26.89);Difficulty in walking, not elsewhere classified (R26.2);Other symptoms and signs involving the nervous system (U57.505)     Time: 1833-5825 PT Time Calculation (min) (ACUTE ONLY): 29 min  Charges:  $Therapeutic Activity: 23-37 mins                     Muzamil Harker A. Gilford Rile PT, DPT Acute Rehabilitation Services Pager [phone] Office [phone]    Linna Hoff 02/24/2021, 5:34 PM

## 2021-02-25 DIAGNOSIS — E039 Hypothyroidism, unspecified: Secondary | ICD-10-CM | POA: Diagnosis not present

## 2021-02-25 DIAGNOSIS — Z515 Encounter for palliative care: Secondary | ICD-10-CM | POA: Diagnosis not present

## 2021-02-25 DIAGNOSIS — U071 COVID-19: Secondary | ICD-10-CM | POA: Diagnosis not present

## 2021-02-25 DIAGNOSIS — I10 Essential (primary) hypertension: Secondary | ICD-10-CM | POA: Diagnosis not present

## 2021-02-25 DIAGNOSIS — N1832 Chronic kidney disease, stage 3b: Secondary | ICD-10-CM | POA: Diagnosis not present

## 2021-02-25 LAB — HEPARIN LEVEL (UNFRACTIONATED)
Heparin Unfractionated: 0.49 IU/mL (ref 0.30–0.70)
Heparin Unfractionated: 0.45 IU/mL (ref 0.30–0.70)

## 2021-02-25 LAB — RENAL FUNCTION PANEL
Albumin: 2.9 g/dL — ABNORMAL LOW (ref 3.5–5.0)
Anion gap: 13 (ref 5–15)
BUN: 26 mg/dL — ABNORMAL HIGH (ref 8–23)
CO2: 23 mmol/L (ref 22–32)
Calcium: 9.9 mg/dL (ref 8.9–10.3)
Chloride: 102 mmol/L (ref 98–111)
Creatinine, Ser: 1.46 mg/dL — ABNORMAL HIGH (ref 0.44–1.00)
GFR, Estimated: 36 mL/min — ABNORMAL LOW (ref >60)
Glucose, Bld: 116 mg/dL — ABNORMAL HIGH (ref 70–99)
Phosphorus: 3 mg/dL (ref 2.5–4.6)
Potassium: 3.6 mmol/L (ref 3.5–5.1)
Sodium: 138 mmol/L (ref 135–145)

## 2021-02-25 LAB — GLUCOSE, CAPILLARY
Glucose-Capillary: 111 mg/dL — ABNORMAL HIGH (ref 70–99)
Glucose-Capillary: 142 mg/dL — ABNORMAL HIGH (ref 70–99)
Glucose-Capillary: 106 mg/dL — ABNORMAL HIGH (ref 70–99)
Glucose-Capillary: 109 mg/dL — ABNORMAL HIGH (ref 70–99)

## 2021-02-25 LAB — CBC
HCT: 32.6 % — ABNORMAL LOW (ref 36.0–46.0)
Hemoglobin: 10.7 g/dL — ABNORMAL LOW (ref 12.0–15.0)
MCH: 29.6 pg (ref 26.0–34.0)
MCHC: 32.8 g/dL (ref 30.0–36.0)
MCV: 90.1 fL (ref 80.0–100.0)
Platelets: 189 10*3/uL (ref 150–400)
RBC: 3.62 MIL/uL — ABNORMAL LOW (ref 3.87–5.11)
RDW: 11.9 % (ref 11.5–15.5)
WBC: 9.1 10*3/uL (ref 4.0–10.5)
nRBC: 0 % (ref 0.0–0.2)

## 2021-02-25 LAB — METHYLMALONIC ACID, SERUM: Methylmalonic Acid, Quantitative: 562 nmol/L — ABNORMAL HIGH (ref 0–378)

## 2021-02-25 LAB — MAGNESIUM: Magnesium: 1.7 mg/dL (ref 1.7–2.4)

## 2021-02-25 LAB — AMMONIA: Ammonia: 16 umol/L (ref 9–35)

## 2021-02-25 MED ORDER — ASPIRIN 300 MG RE SUPP
150.0000 mg | Freq: Every day | RECTAL | Status: DC
  Administered 2021-02-25 – 2021-02-26 (×2): 150 mg via RECTAL
  Filled 2021-02-25 (×3): qty 1

## 2021-02-25 MED ORDER — METOPROLOL TARTRATE 5 MG/5ML IV SOLN
5.0000 mg | Freq: Once | INTRAVENOUS | Status: AC
  Administered 2021-02-25: 5 mg via INTRAVENOUS
  Filled 2021-02-25: qty 5

## 2021-02-25 MED ORDER — LEVOTHYROXINE SODIUM 100 MCG/5ML IV SOLN: 25.0000 ug | Freq: Every day | INTRAVENOUS | Status: DC

## 2021-02-25 MED ORDER — ASPIRIN EC 81 MG PO TBEC
81.0000 mg | DELAYED_RELEASE_TABLET | Freq: Every day | ORAL | Status: DC
  Filled 2021-02-25 (×2): qty 1

## 2021-02-25 MED ORDER — LABETALOL HCL 5 MG/ML IV SOLN
10.0000 mg | INTRAVENOUS | Status: DC | PRN
  Administered 2021-02-27: 10 mg via INTRAVENOUS
  Filled 2021-02-25: qty 4

## 2021-02-25 MED ORDER — LEVOTHYROXINE SODIUM 100 MCG/5ML IV SOLN
25.0000 ug | Freq: Every day | INTRAVENOUS | Status: DC
  Administered 2021-02-25 – 2021-02-26 (×2): 25 ug via INTRAVENOUS
  Filled 2021-02-25 (×3): qty 5

## 2021-02-25 NOTE — Progress Notes (Signed)
PROGRESS NOTE  Dorothy Barker:[id number] DOB: September 21, 1940   PCP: Earmon Phoenix, NP  Patient is from: ALF.  Uses wheelchair for most part but walks some distance with therapy.  DOA: 02/21/2021 LOS: 4  Chief complaints:  Chief Complaint  Patient presents with   Code Stroke     Brief Narrative / Interim history: 80 year old F with PMH of HTN, IDDM-2, a flutter on Pradaxa, PAD, multiple recent CVAs, cognitive impairment, HLD, pressure skin injury of bilateral heels and ambulatory dysfunction brought to ED with expressive aphasia and found to have acute left posterior insular stroke with scattered punctate foci of acute ischemia in posterior left MCA territory, near the parietal temporal junction and old right BG infarct.  CTA head and neck without emergent LVO but mild progression of severe stenosis of the left MCA mid M1 segment and proximal M2 segment, unchanged moderate to severe stenosis of the distal right M1 segment and unchanged severe stenosis of the left PCA P2 segment.  TTE and carotid US without significant finding.  Per neurology, concern about Pradaxa failure, and recommended adding low-dose aspirin.  Given grim long-term prognosis, palliative medicine consulted. Patient has persistent encephalopathy.  Repeat CT head showed extension of previously seen left MCA territory infarction, now involving the entire distribution of the inferior division of left MCA with mild swelling but no hemorrhage.   Subjective: Seen and examined earlier this morning.  No major events overnight of this morning.   She is awake but does not follows command or talk.  Does not appear to be in distress.  Patient's daughter at bedside.  Objective: Vitals:   02/24/21 2336 02/25/21 0317 02/25/21 0717 02/25/21 1058  BP: (!) 136/52 (!) 132/57 (!) 145/75 (!) 197/91  Pulse: 80 87 88   Resp: 17 17 20    Temp: 99.6 F (37.6 C) 99.4 F (37.4 C) 98.6 F (37 C)   TempSrc: Oral Oral Oral   SpO2: 96% 93% 97%    Weight:      Height:        Intake/Output Summary (Last 24 hours) at 02/25/2021 1240 Last data filed at 02/25/2021 0940 Gross per 24 hour  Intake 962.5 ml  Output 300 ml  Net 662.5 ml   Filed Weights   02/21/21 2156  Weight: 78.9 kg    Examination:  GENERAL: No apparent distress.  Nontoxic. HEENT: MMM.  Vision and hearing grossly intact.  NECK: Supple.  No apparent JVD.  RESP: On RA.  No IWOB.  Fair aeration bilaterally. CVS:  RRR. Heart sounds normal.  ABD/GI/GU: BS+. Abd soft, NTND.  MSK/EXT:  Moves extremities. No apparent deformity. No edema.  In [employer]. SKIN: Feet in [employer] boot. NEURO: Awake but does not follow command or talk.  PERRL.  No facial asymmetry.  PSYCH: Calm.  No distress or agitation.  Procedures:  None  Microbiology summarized: COVID-19 PCR positive but was positive on 6/19 Urine culture negative.  Assessment & Plan: Acute Ischemic Stroke-presented with expressive aphasia.  MRI brain with small acute infarct of the posterior L insula, few scattered punctate foci of acute ischemia in the posterior L MCA territory, near the parietal temporal junction, old right basal ganglia infarct and findings of chronic ischemic microangiopathy.  CTA neck and head with multiple relatively stable intracranial stenosis.  Repeat CT head with extension of left MCA CVA and mild edema.  TTE and carotid US without significant finding.  LDL 56.  A1c 8.4%.  Concern about Pradaxa failure.  Per  Dr. Leonie Man, patient is not a candidate for interventional care. -Continue IV heparin  -Low-dose aspirin p.o. or rectal -Continue high intensity statin when able to take p.o. safely. -Permissive hypertension up to 220/110 -Continue PT/OT/SLP -Palliative care consulted for goals of care discussion  Acute Metabolic Encephalopathy in patient with cognitive impairment-likely due to CVA with underlying cognitive impairment.  UA, folate, TSH, ammonia and urine culture unrevealing.  B12 low  side of normal. -Treat treatable causes -Reorientation and delirium precautions. -Gentle IV fluid for hydration -Palliative care consulted.  Dysphagia-likely due to #1.  SLP recommended dysphagia 2 diet on 7/6.  However, concern about patient's ability to take p.o. safely today due to encephalopathy. -Request SLP to reevaluate patient  COVID positive-was positive on 6/19 with mild/asymptomatic disease. -She is outside the 10 days window for isolation precaution.  CKD-3B/azotemia: Relatively stable. Recent Labs    02/06/21 1919 02/06/21 1941 02/07/21 0831 02/08/21 0049 02/21/21 2049 02/21/21 2053 02/22/21 0810 02/23/21 1338 02/24/21 0116 02/25/21 0624  BUN 31* 32* 25* 30* 26* 27* 23 20 24* 26*  CREATININE 1.43* 1.30* 1.28* 1.36* 1.53* 1.50* 1.28* 1.31* 1.52* 1.46*  -Gentle IV fluid as above.  Anemia of chronic disease: H&H relatively stable. Recent Labs    02/06/21 1919 02/06/21 1941 02/07/21 0831 02/08/21 0049 02/21/21 2049 02/21/21 2053 02/22/21 0810 02/23/21 1338 02/24/21 0116 02/25/21 0624  HGB 11.9* 12.2 11.2* 11.6* 11.4* 11.9* 10.9* 11.1* 11.0* 10.7*    Microscopic hematuria -Repeat UA outpatient   Hypertension: BP within acceptable range. -Neurology recommended permissive hypertension after 220/110, and normalizing over 5 to 6 days   Hypothyroidism -IV Synthroid    Atrial Flutter: Rate controlled. -Continue metoprolol  -Anticoagulation as above   Uncontrolled IDDM-2 with hyperglycemia: A1c 8.4%.  Hyperglycemia likely due to dextrose. Recent Labs  Lab 02/24/21 1238 02/24/21 1606 02/24/21 2105 02/25/21 0618 02/25/21 1212  GLUCAP 143* 126* 90 111* 142*  -Discontinue D5-LR -Continue current insulin regimen  Debility/ambulatory dysfunction -PT/OT-recommended SNF  Goal of care counseling: again discussed about new CT findings and patient's overall decline with patient's daughter at the bedside.  Patient's daughter seems to understand but very  emotional and tearful.  She is waiting to hear from palliative care.  Also planning to talk to other family members -Palliative consulted on 7/6.  Class I obesity Body mass index is 33.97 kg/m.       Pressure skin injury: POA Pressure Injury 02/07/21 Heel Left Unstageable - Full thickness tissue loss in which the base of the injury is covered by slough (yellow, tan, gray, green or brown) and/or eschar (tan, brown or black) in the wound bed. (Active)  02/07/21 1222  Location: Heel  Location Orientation: Left  Staging: Unstageable - Full thickness tissue loss in which the base of the injury is covered by slough (yellow, tan, gray, green or brown) and/or eschar (tan, brown or black) in the wound bed.  Wound Description (Comments):   Present on Admission: Yes   DVT prophylaxis:  SCD's Start: 02/22/21 0231  Code Status: Full code Family Communication: Patient and/or RN.  Updated patient's daughter at bedside. Level of care: Telemetry Medical Status is: Inpatient  Remains inpatient appropriate because:Altered mental status, Unsafe d/c plan, and Inpatient level of care appropriate due to severity of illness  Dispo: The patient is from: ALF              Anticipated d/c is to:  To be determined.  Patient currently is not medically stable to d/c.   Difficult to place patient No       Consultants:  Neurology Palliative medicine   Sch Meds:  Scheduled Meds:   stroke: mapping our early stages of recovery book   Does not apply Once   aspirin EC  81 mg Oral Daily   Or   aspirin  150 mg Rectal Daily   atorvastatin  40 mg Oral QHS   collagenase   Topical Daily   insulin aspart  0-9 Units Subcutaneous TID WC   insulin glargine  15 Units Subcutaneous QHS   [START ON 02/28/2021] levothyroxine  25 mcg Intravenous Daily   metoprolol tartrate  50 mg Oral BID   pantoprazole (PROTONIX) IV  40 mg Intravenous QHS   Continuous Infusions:  heparin 1,050 Units/hr (02/24/21  2122)   lactated ringers 60 mL/hr at 02/24/21 1235   PRN Meds:.acetaminophen **OR** acetaminophen (TYLENOL) oral liquid 160 mg/5 mL **OR** acetaminophen, labetalol, ondansetron (ZOFRAN) IV, polyethylene glycol  Antimicrobials: Anti-infectives (From admission, onward)    None        I have personally reviewed the following labs and images: CBC: Recent Labs  Lab 02/21/21 2049 02/21/21 2053 02/22/21 0810 02/23/21 1338 02/24/21 0116 02/25/21 0624  WBC 11.1*  --  9.3 9.1 8.9 9.1  NEUTROABS 6.8  --  6.2 5.7  --   --   HGB 11.4* 11.9* 10.9* 11.1* 11.0* 10.7*  HCT 34.7* 35.0* 33.3* 33.4* 33.0* 32.6*  MCV 89.4  --  89.8 88.6 88.9 90.1  PLT 244  --  221 230 213 189   BMP &GFR Recent Labs  Lab 02/21/21 2049 02/21/21 2053 02/22/21 0810 02/23/21 1338 02/24/21 0116 02/25/21 0624  NA 134* 135 134* 137 137 138  K 3.4* 3.4* 3.5 3.4* 3.8 3.6  CL 101 101 99 103 103 102  CO2 22  --  26 26 25 23   GLUCOSE 294* 283* 206* 154* 118* 116*  BUN 26* 27* 23 20 24* 26*  CREATININE 1.53* 1.50* 1.28* 1.31* 1.52* 1.46*  CALCIUM 10.1  --  9.9 9.8 9.9 9.9  MG  --   --   --  1.6* 1.9 1.7  PHOS  --   --   --  3.1 3.6 3.0   Estimated Creatinine Clearance: 28.1 mL/min (A) (by C-G formula based on SCr of 1.46 mg/dL (H)). Liver & Pancreas: Recent Labs  Lab 02/21/21 2049 02/22/21 0810 02/23/21 1338 02/24/21 0116 02/25/21 0624  AST 20 19 20   --   --   ALT 18 15 17   --   --   ALKPHOS 64 57 58  --   --   BILITOT 0.6 0.9 0.9  --   --   PROT 6.4* 5.8* 6.0*  --   --   ALBUMIN 3.0* 3.0* 2.9* 2.8* 2.9*   No results for input(s): LIPASE, AMYLASE in the last 168 hours. Recent Labs  Lab 02/22/21 0307 02/25/21 0624  AMMONIA 21 16   Diabetic: No results for input(s): HGBA1C in the last 72 hours.  Recent Labs  Lab 02/24/21 1238 02/24/21 1606 02/24/21 2105 02/25/21 0618 02/25/21 1212  GLUCAP 143* 126* 90 111* 142*   Cardiac Enzymes: No results for input(s): CKTOTAL, CKMB, CKMBINDEX,  TROPONINI in the last 168 hours. No results for input(s): PROBNP in the last 8760 hours. Coagulation Profile: Recent Labs  Lab 02/21/21 2049  INR 1.7*   Thyroid Function Tests: No results for input(s): TSH, T4TOTAL, FREET4,  T3FREE, THYROIDAB in the last 72 hours.  Lipid Profile: No results for input(s): CHOL, HDL, LDLCALC, TRIG, CHOLHDL, LDLDIRECT in the last 72 hours.  Anemia Panel: No results for input(s): VITAMINB12, FOLATE, FERRITIN, TIBC, IRON, RETICCTPCT in the last 72 hours.  Urine analysis:    Component Value Date/Time   COLORURINE STRAW (A) 02/21/2021 2151   APPEARANCEUR CLEAR 02/21/2021 2151   LABSPEC 1.011 02/21/2021 2151   PHURINE 6.0 02/21/2021 2151   GLUCOSEU >=500 (A) 02/21/2021 2151   HGBUR MODERATE (A) 02/21/2021 2151   BILIRUBINUR NEGATIVE 02/21/2021 2151   [place] NEGATIVE 02/21/2021 2151   PROTEINUR 100 (A) 02/21/2021 2151   NITRITE NEGATIVE 02/21/2021 2151   LEUKOCYTESUR NEGATIVE 02/21/2021 2151   Sepsis Labs: Invalid input(s): PROCALCITONIN, [place]  Microbiology: Recent Results (from the past 240 hour(s))  Urine Culture     Status: None   Collection Time: 02/21/21  9:51 PM   Specimen: Urine, Random  Result Value Ref Range Status   Specimen Description URINE, RANDOM  Final   Special Requests NONE  Final   Culture   Final    NO GROWTH Performed at [facility], [address], [place], [place] [address]    Report Status 02/23/2021 FINAL  Final  SARS CORONAVIRUS 2 (TAT 6-24 HRS) Nasopharyngeal Nasopharyngeal Swab     Status: Abnormal   Collection Time: 02/22/21 12:59 PM   Specimen: Nasopharyngeal Swab  Result Value Ref Range Status   SARS Coronavirus 2 POSITIVE (A) NEGATIVE Final    Comment: (NOTE) SARS-CoV-2 target nucleic acids are DETECTED.  The SARS-CoV-2 RNA is generally detectable in upper and lower respiratory specimens during the acute phase of infection. Positive results are indicative of the presence of SARS-CoV-2  RNA. Clinical correlation with patient history and other diagnostic information is  necessary to determine patient infection status. Positive results do not rule out bacterial infection or co-infection with other viruses.  The expected result is Negative.  Fact Sheet for Patients: SugarRoll.be  Fact Sheet for Healthcare Providers: https://www.woods-mathews.com/  This test is not yet approved or cleared by the [place] FDA and  has been authorized for detection and/or diagnosis of SARS-CoV-2 by FDA under an Emergency Use Authorization (EUA). This EUA will remain  in effect (meaning this test can be used) for the duration of the COVID-19 declaration under Section 564(b)(1) of the Act, 21 U. S.C. section 360bbb-3(b)(1), unless the authorization is terminated or revoked sooner.   Performed at [facility], [place] [address], [place], [place] [address]     Radiology Studies: CT HEAD WO CONTRAST  Result Date: 02/24/2021 CLINICAL DATA:  Follow-up left brain stroke. EXAM: CT HEAD WITHOUT CONTRAST TECHNIQUE: Contiguous axial images were obtained from the base of the skull through the vertex without intravenous contrast. COMPARISON:  MRI and CT studies 02/21/2021. FINDINGS: Brain: Brainstem and cerebellum remain unremarkable. Right cerebral hemisphere shows old infarction in the basal ganglia and radiating white matter tracts in small-vessel changes of the white matter. On the left, there is been considerable progression of infarction within the left MCA territory, apparently affecting the entire inferior division which shows confluent low-density and swelling. No evidence of hemorrhage there is only mild mass effect with left-to-right shift of 1 mm. No hydrocephalus. No extra-axial collection. Vascular: There is atherosclerotic calcification of the major vessels at the base of the brain. Skull: Negative Sinuses/Orbits: Clear except for a retention  cyst in the left maxillary sinus. Orbits negative. Other: None IMPRESSION: Extension of  the previously seen left MCA territory infarction, now involving the entire distribution of the inferior division of the left MCA. Low-density throughout that region with mild swelling but no hemorrhage. Electronically Signed   By: Nelson Chimes M.D.   On: 02/24/2021 16:45     Elisha Mcgruder T. [place]  If 7PM-7AM, please contact night-coverage [email or web address] 02/25/2021, 12:40 PM

## 2021-02-25 NOTE — TOC Progression Note (Signed)
Transition of Care [facility]) - Progression Note    Patient Details  Name: CHESNI VOS MRN: [id number] Date of Birth: September 07, 1940  Transition of Care [facility]) CM/SW [place], Alexis Phone Number: 02/25/2021, 3:30 PM  Clinical Narrative:   CSW acknowledging consult from palliative medicine team to discuss more information about disposition options with the daughter. CSW had previously met with Angela Nevin to discuss SNF and her preferences, but CSW met with daughter this afternoon to offer sending a referral to the hospice agency for liaison to discuss hospice options with her. CSW answered questions from daughter and daughter's husband, and daughter is not ready for that just yet but is appreciative of having the option. CSW indicated that she is on call tomorrow, if there is anything that CSW can do to assist in obtaining information or making care decisions. CSW to continue to follow.    Expected Discharge Plan: [place] Barriers to Discharge: Continued Medical Work up, Ship broker  Expected Discharge Plan and Services Expected Discharge Plan: [place] Choice: [place] arrangements for the past 2 months: Assisted Living Facility                                       Social Determinants of Health (SDOH) Interventions    Readmission Risk Interventions No flowsheet data found.

## 2021-02-25 NOTE — Progress Notes (Signed)
  Speech Language Pathology Treatment: Dysphagia  Patient Details Name: Dorothy Barker MRN: [id number] DOB: 1941-06-09 Today's Date: 02/25/2021 Time: 7530-0511 SLP Time Calculation (min) (ACUTE ONLY): 17 min  Assessment / Plan / Recommendation Clinical Impression  Pt was seen for dysphagia treatment. She was lethargic during the session with reduced verbal output compared to the evaluation on 7/6. Pt verbalized "two" intermittently during the session, but no other output was noted. Oral care was provided and pt demonstrated limited lingual movement during this. Pt exhibited poor bolus awareness with delayed, and inconsistent bolus manipulation and deglutition. Pt swallowed three boluses of honey thick liquids via tsp, but required verbal and tactile cues for labial closure and swallowing. No s/sx of aspiration were noted with these boluses. No bolus manipulation was noted with subsequent boluses of puree solids or honey thick liquids and these boluses were ultimately removed from the oral cavity via oral suction. Pt does not present as a candidate for safe oral intake at this time. An NPO status is therefore recommended at this time with allowance of honey thick liquids via tsp if pt's level of alertness improves and she would like something p.o. SLP will follow to assess improvement in swallow function and for initiation of a p.o. diet as clinically indicated.    HPI HPI: Pt is an 80 yo female presenting with acute onset ahasia. MRI revealed small acute infarct of the posterior left insula and a few scattered punctate foci of acute ischemia in the posterior left MCA territory. Pt initially passed a Yale swallow screen but there was concern about swallowing safety given decreased LOA and aphasia. CT head 7/7: Extension of the previously seen left MCA territory infarction, now  involving the entire distribution of the inferior division of the  left MCA. Low-density throughout that region with mild swelling.  PMT 7/8: family would like full scope, pt full code. PMH includes: DM, HLD, CKD, strokes including recent large acute L cerebellar stroke in June 2022, recent COVID (June 2022)      SLP Plan  Continue with current plan of care       Recommendations  Diet recommendations: NPO Liquids provided via: Teaspoon (Pt may have honey thick liquids via tsp if adequately alert) Medication Administration: Via alternative means                Oral Care Recommendations: Oral care BID Follow up Recommendations: Skilled Nursing facility SLP Visit Diagnosis: Dysphagia, unspecified (R13.10) Plan: Continue with current plan of care       Davida Falconi I. Hardin Negus, [place], [place] Office number [phone] Pager [place] 02/25/2021, 4:28 PM

## 2021-02-25 NOTE — Progress Notes (Signed)
Occupational Therapy Treatment Patient Details Name: Dorothy Barker MRN: [id number] DOB: 1941-03-01 Today's Date: 02/25/2021    History of present illness 80 y/o female presented to ED on 7/5 from facility with global aphasia and confusion. MRI demonstrated small acute infarct in L posterior insula with scattered punctate foci of acute ischemia in posterior L MCA territory. PMH: HLD, HTN, DM type 2, hypothyroidism, atrial flutter, PVD, multiple recent stroke (08/2020 and 02/07/21)   OT comments  Patient with incremental gains towards patient focused goals.  Seen in conjunction with PT for safety.  Max A of 2 for basic bed mobility.  Initial Max A for edge of bed sitting, improving to supervision at times.  Able to wash face and comb hair seated with initial hand of hand.  Patient will perseverate on one area, then stop.  Max A to complete task.  She is not responding to questions, and not following one step commands.  Head turn and eye gaze remains to the L. Daughter educated on stimuli on R side and using a hand towel to gently turn her head to the R.  SNF is recommended given 24 hour heavy care needed.  OT will continue to follow acutely.    Follow Up Recommendations  SNF    Equipment Recommendations  Wheelchair (measurements OT);Wheelchair cushion (measurements OT)    Recommendations for Other Services      Precautions / Restrictions Precautions Precautions: Fall;Other (comment) Precaution Comments: WB through ball of L foot in darco shoe (no WBing through L heel due to wound) Other Brace: darco shoe in room Restrictions LLE Weight Bearing: Non weight bearing Other Position/Activity Restrictions: L heel offloading shoe       Mobility Bed Mobility Overal bed mobility: Needs Assistance Bed Mobility: Supine to Sit;Sit to Supine     Supine to sit: Total assist;+2 for physical assistance;+2 for safety/equipment Sit to supine: Total assist;+2 for physical assistance;+2 for  safety/equipment     Patient Response: Flat affect  Transfers                      Balance Overall balance assessment: Needs assistance Sitting-balance support: No upper extremity supported;Single extremity supported Sitting balance-Leahy Scale: Fair Sitting balance - Comments: varied between needing Max A initially, to sitting with supervision.                                   ADL either performed or assessed with clinical judgement   ADL       Grooming: Sitting;Maximal assistance;Total assistance Grooming Details (indicate cue type and reason): Patient with hand over hand to initiate, peseverated on one area of face and hair, Max A to complete.                                     Vision   Vision Assessment?: Yes Alignment/Gaze Preference: Gaze left Tracking/Visual Pursuits: Requires cues, head turns, or add eye shifts to track Visual Fields: Right visual field deficit   Perception     Praxis      Cognition Arousal/Alertness: Awake/alert Behavior During Therapy: Flat affect Overall Cognitive Status: History of cognitive impairments - at baseline  General Comments: not responding to questions, not following commands        Exercises                  Pertinent Vitals/ Pain       Pain Assessment: Faces Faces Pain Scale: Hurts little more Pain Location: generalized movement Pain Descriptors / Indicators: Grimacing;Guarding Pain Intervention(s): Monitored during session                                                          Frequency  Min 2X/week        Progress Toward Goals  OT Goals(current goals can now be found in the care plan section)  Progress towards OT goals: Progressing toward goals  Acute Rehab OT Goals Patient Stated Goal: To have mother respond to questions OT Goal Formulation: Patient unable to participate in goal  setting Time For Goal Achievement: 13-Mar-2021 Potential to Achieve Goals: [place] Discharge plan remains appropriate    Co-evaluation                 AM-PAC OT "6 Clicks" Daily Activity     Outcome Measure   Help from another person eating meals?: A Lot Help from another person taking care of personal grooming?: A Lot Help from another person toileting, which includes using toliet, bedpan, or urinal?: Total Help from another person bathing (including washing, rinsing, drying)?: Total Help from another person to put on and taking off regular upper body clothing?: A Lot Help from another person to put on and taking off regular lower body clothing?: Total 6 Click Score: 9    End of Session    OT Visit Diagnosis: Muscle weakness (generalized) (M62.81);Other symptoms and signs involving cognitive function   Activity Tolerance Patient limited by lethargy   Patient Left in bed;with bed alarm set;with family/visitor present   Nurse Communication          Time: 8841-6606 OT Time Calculation (min): 33 min  Charges: OT General Charges $OT Visit: 1 Visit OT Treatments $Self Care/Home Management : 8-22 mins  02/25/2021  Rich, OTR/L  Acute Rehabilitation Services  Office:  [phone]    Metta Clines 02/25/2021, 12:09 PM

## 2021-02-25 NOTE — Progress Notes (Signed)
This chaplain responded to PMT PA-Marianne Dellinger referral for spiritual care.    The chaplain began listening to the Pt. daughter-Carla when the MD arrived and the chaplain stepped away.  The chaplain will return at the best time for the family and the chaplain.  **1630  The Pt. daughter-Carla stepped into the waiting area to continue the previous spiritual care visit.    The chaplain offered emotional support and reflective listening as Angela Nevin shared the events leading up to this admission.  Carla's question is  "What advice can you give me?"  The chaplain understands the Pt. and Angela Nevin have not talked about end of life care. The chaplain encouraged Angela Nevin to reflect on the Pt. meaning of life as Angela Nevin looks for the answers to her questions.  The chaplain understands Angela Nevin will have time with her family tonight.  The chaplain affirmed the time together and offered F/U spiritual care as needed. The chaplain shared prayer with the family.

## 2021-02-25 NOTE — Consult Note (Signed)
Consultation Note Date: 02/25/2021   Patient Name: Dorothy Barker  DOB: 11/22/1940  MRN: [id number]  Age / Sex: 80 y.o., female  PCP: Earmon Phoenix, NP Referring Physician: Mercy Riding, MD  Reason for Consultation: Establishing goals of care  HPI/Patient Profile: 80 y.o. female  with past medical history of kidney cancer, chronic kidney disease stage IIIb, diabetes mellitus, hypothyroidism, atrial flutter, multiple small strokes in the past, grade 1 diastolic dysfunction and recent COVID admission who was admitted on 02/21/2021 with encephalopathy and garbled speech.  She was found to have multiple small scattered CVAs.  A second CT was done on July 7 that showed extension of her CVA covering a significant portion of her left lower brain.  Currently the patient is still quite encephalopathic she is eating and drinking very little.  She is having difficulties with significant cough when she does take p.o.  Clinical Assessment and Goals of Care: I have reviewed medical records including EPIC notes, labs and imaging, received report from RN, assessed the patient and then met at the bedside along with her daughter and POA, Dorothy Barker her Dorothy Barker and [place] husband Dorothy Barker, to discuss diagnosis prognosis, GOC, EOL wishes, disposition and options.  I introduced Palliative Medicine as specialized medical care for people living with serious illness. It focuses on providing relief from the symptoms and stress of a serious illness. The goal is to improve quality of life for both the patient and the family.  We discussed a brief life review of the patient and then focused on their current illness. The natural disease trajectory and expectations at EOL were discussed.  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  She had 1 daughter who lives locally and is her primary caretaker.  She also  has a son who lives in [place].  Her daughter Dorothy Barker explains to me that over the last 6 to 8 months her mother has experienced several strokes but was able to bounce back each time.  Most recently she was living in assisted living facility.  She was able to do her own ADLs with minimal assistance and she was quite adept at working complex puzzles and word find games.  I attempted to elicit values and goals of care important to the patient.  Dorothy Barker tells me that she and her mother had had a brief conversation about her goals the patient wanted very much to see her granddaughter graduate from nursing school and to live to see other big life events.  For this reason she had told Dorothy Barker "do not make me a DNR "I want to live.  Naturally Dorothy Barker is quite conflicted about upholding her mother's wishes in this very difficult situation.  We talked at length about the cumulative nature of her strokes have had.  We talked about vascular dementia and its stairstep like trajectory downward.  We talked about a plan for nutrition.  I advised that Mrs. Myer will aspirate whether or not she has a  feeding tube.  She will even aspirate on her own saliva.  I explained that there is no data that feeding tubes help heal wounds or extend life.  As this is not a reversible illness I explained that we would medically advise against a long-term feeding tube.  I also talked with Dorothy Barker about 2 different paths forward.  One being a path of comfort and dignity involving hospice and the other being a path of continued medical interventions pressing for more time at a skilled nursing facility.  Dorothy Barker mentioned that her grandparents both had their end-of-life experiences at a nursing facility.  She does not wish that for her mother.  At the same time she has a very difficult time thinking about hospice and end-of-life with regard to her mother.  Dorothy Barker's husband Dorothy Barker arrived and we went through the highlights of the meeting with him as well.   Dorothy Barker advised that his mother told him "if I get to that point just let me go ".  We all supported Dorothy Barker.  She must do what she feels is best for her mother.  We reviewed a MOST form together.  I pointed out the difference between the first and second boxes.  The first box indicating cardiac arrest-this would be where it is important to choose DNR or CPR.  As opposed to the second box when a person is just in need of medical care.  Dorothy Barker and Dorothy Barker asked to review the form together privately.  I also left them with a "hard choices "booklet and advised them to read chapters 1 and 2 on CPR and feeding tubes respectively.  Dorothy Barker explained that she makes decisions slowly and carefully.  She gathers all data before coming to a conclusion.  I feel that is a good thing in the situation.   I offered to ask TOC to determine how many SNF days she has left this year and to possibly allow hospice to meet directly with her to answer any questions about care or finances.  Discussed the importance of continued conversation with family and the medical providers regarding overall plan of care and treatment options, ensuring decisions are within the context of the patient's values and GOCs.    Questions and concerns were addressed. The family was encouraged to call with questions or concerns.  PMT will continue to support holistically.    Primary Decision Maker:  NEXT OF KIN daughter Dorothy Barker is her POA and primary caretaker  SUMMARY OF RECOMMENDATIONS    PMT will continue to meet with the family as they navigate through these difficult decisions Will ask TOC to provide Dorothy Barker with information regarding how many skilled nursing facility days Mrs. Dry has left this year. Have asked Dorothy Barker also to consider talking directly to hospice to answer any more questions regarding finances or care  Code Status/Advance Care Planning: Full code   Symptom Management:  Patient currently appears comfortable  Additional  Recommendations (Limitations, Scope, Preferences): Full Scope Treatment  Palliative Prophylaxis:  Frequent Pain Assessment, Oral Care, Palliative Wound Care, and Turn Reposition  Psycho-social/Spiritual:  Desire for further Chaplaincy support:  Prognosis: Difficult to determine at this time.  Patient is not eating or drinking enough to sustain herself long-term.  Given this she likely has weeks.  She is nonverbal, unable to track, not taking p.o.'s.   Discharge Planning: To Be Determined      Primary Diagnoses: Present on Admission:  Acute ischemic stroke [facility])  Essential hypertension  Hypothyroidism  (Resolved) COVID-19  virus infection  Paroxysmal atrial flutter (HCC)  Chronic kidney disease, stage 3b ([place])   I have reviewed the medical record, interviewed the patient and family, and examined the patient. The following aspects are pertinent.  Past Medical History:  Diagnosis Date   Cancer ([place])    kidney   Chronic kidney disease    Chronic kidney disease, stage 3b ([employer]) 02/06/2021   Diabetes mellitus without complication ([place])    Essential hypertension 02/06/2021   Hyperlipidemia    Hypertension    Hypothyroidism 02/06/2021   Paroxysmal atrial flutter ([place]) 02/06/2021   Stroke ([place])    Thyroid disease    Type 2 diabetes mellitus with stage 3b chronic kidney disease, with long-term current use of insulin ([place]) 02/06/2021   Vitamin D deficiency    Social History   Socioeconomic History   Marital status: Single    Spouse name: Not on file   Number of children: Not on file   Years of education: Not on file   Highest education level: Not on file  Occupational History   Not on file  Tobacco Use   Smoking status: Never    Passive exposure: Yes   Smokeless tobacco: Never  Vaping Use   Vaping Use: Never used  Substance and Sexual Activity   Alcohol use: Never   Drug use: Never   Sexual activity: Not on file  Other Topics Concern   Not on file  Social History  Narrative   ** Merged History Encounter **       Resides at [facility] [place], Assisted Living Right Handed Drinks 1 cups caffeine daily   Social Determinants of Health   Financial Resource Strain: Not on file  Food Insecurity: Not on file  Transportation Needs: Not on file  Physical Activity: Not on file  Stress: Not on file  Social Connections: Not on file   Family History  Problem Relation Age of Onset   Heart disease Neg Hx     Allergies  Allergen Reactions   Tramadol Other (See Comments)    Syncopal episodes    Amlodipine Besy-Benazepril Hcl Rash   Codeine Rash   Lotensin [Benazepril] Rash   Naproxen Rash     Vital Signs: BP (!) 197/91   Pulse 88   Temp 98.6 F (37 C) (Oral)   Resp 20   Ht 5' (1.524 m)   Wt 78.9 kg   SpO2 97%   BMI 33.97 kg/m  Pain Scale: PAINAD   Pain Score: 0-No pain   SpO2: SpO2: 97 % O2 Device:SpO2: 97 % O2 Flow Rate: .     Palliative Assessment/Data: 20%     Time In: 9:00 Time Out: 1010 Time Total: 70 minutes Visit consisted of counseling and education dealing with the complex and emotionally intense issues surrounding the need for palliative care and symptom management in the setting of serious and potentially life-threatening illness. Greater than 50%  of this time was spent counseling and coordinating care related to the above assessment and plan.  Signed by: Florentina Jenny, PA-C Palliative Medicine  Please contact Palliative Medicine Team phone at [phone] for questions and concerns.  For individual provider: See Shea Evans

## 2021-02-25 NOTE — Progress Notes (Signed)
ANTICOAGULATION CONSULT NOTE - Follow Up Consult  Pharmacy Consult for IV Heparin (Pradaxa on hold) Indication: atrial fibrillation and stroke  Allergies  Allergen Reactions   Tramadol Other (See Comments)    Syncopal episodes    Amlodipine Besy-Benazepril Hcl Rash   Codeine Rash   Lotensin [Benazepril] Rash   Naproxen Rash    Patient Measurements: Height: 5' (152.4 cm) Weight: 78.9 kg (173 lb 15.1 oz) IBW/kg (Calculated) : 45.5 Heparin Dosing Weight: 63.5 kg  Vital Signs: Temp: 98.6 F (37 C) (07/08 0717) Temp Source: Oral (07/08 0717) BP: 145/75 (07/08 0717) Pulse Rate: 88 (07/08 0717)  Labs: Recent Labs    02/23/21 1338 02/24/21 0116 02/24/21 1934 02/25/21 0624  HGB 11.1* 11.0*  --  10.7*  HCT 33.4* 33.0*  --  32.6*  PLT 230 213  --  189  HEPARINUNFRC 0.16*  --  0.12* 0.49  CREATININE 1.31* 1.52*  --  1.46*    Estimated Creatinine Clearance: 28.1 mL/min (A) (by C-G formula based on SCr of 1.46 mg/dL (H)).   Assessment: 80 yr old woman admitted with aflutter and found to have acute ischemic stroke, having had another recent stroke on 02/07/21. Pt was taking Pradaxa 150 mg PO BID PTA (concern about Pradaxa failure). Received 2 doses of Pradaxa upon admission then was Dc'd due to pt not being able to tolerate PO meds (possibly due to encephalopathy). Last Pradaxa dose was at ~5 PM on 7/6. Heparin infusion was started on 7/6 at 750 units/hr with a HL of 0.16. Heparin was Dc'd and Pradaxa was restarted and Dc'd again due to pt being unable to tolerate PO meds.   Heparin was resumed at 900 units/hr with a HL of 0.12. Rate was then increased to 1050 units/hr with a ~8h HL of 0.49.  Hgb/Hct stable (10.7/32.6), PLT 189    Goal of Therapy:  Heparin level 0.3-0.5 units/ml Monitor platelets by anticoagulation protocol: Yes   Plan:  Continue heparin infusion at 1050 units/hr Check HL in 8 hours (7/8 @1400 ) Monitor H/H and CBC F/U long-term anticoagulation  plan  Pauletta Browns, PharmD Pharmacy Resident 02/25/2021,9:35 AM

## 2021-02-25 NOTE — Progress Notes (Signed)
Physical Therapy Treatment Patient Details Name: Dorothy Barker MRN: [id number] DOB: December 05, 1940 Today's Date: 02/25/2021    History of Present Illness 80 y/o female presented to ED on 7/5 from facility with global aphasia and confusion. MRI demonstrated small acute infarct in L posterior insula with scattered punctate foci of acute ischemia in posterior L MCA territory. Repeat head CT found extension of previous L MCA, involving entire inferior division of L MCA territory. PMH: HLD, HTN, DM type 2, hypothyroidism, atrial flutter, PVD, multiple recent stroke (08/2020 and 02/07/21)    PT Comments    Patient continues to slowly progress. Patient requires totalA+2 for bed mobility. Patient able to sit EOB with no UE support initially requiring maxA progressing to close supervision. Patient able to perform familiar grooming tasks with modA. Continues to answer <25% questions/commands inappropriately. Encouraged daughter to sit on R side to increase stimuli to attend to R side. Continue to recommend SNF for ongoing Physical Therapy.       Follow Up Recommendations  SNF;Supervision/Assistance - 24 hour     Equipment Recommendations  None recommended by PT    Recommendations for Other Services       Precautions / Restrictions Precautions Precautions: Fall;Other (comment) Precaution Comments: WB through ball of L foot in darco shoe (no WBing through L heel due to wound) Required Braces or Orthoses: Other Brace Other Brace: darco shoe in room Restrictions Weight Bearing Restrictions: Yes LLE Weight Bearing: Non weight bearing Other Position/Activity Restrictions: L heel offloading shoe    Mobility  Bed Mobility Overal bed mobility: Needs Assistance Bed Mobility: Supine to Sit;Sit to Supine     Supine to sit: Total assist;+2 for physical assistance;+2 for safety/equipment Sit to supine: Total assist;+2 for physical assistance;+2 for safety/equipment   General bed mobility comments:  totalA+2 for all aspects of bed mobility. No initiation noted    Transfers                 General transfer comment: deferred due to inability to maintain WB precautions with impaired cognition  Ambulation/Gait                 Stairs             Wheelchair Mobility    Modified Rankin (Stroke Patients Only) Modified Rankin (Stroke Patients Only) Pre-Morbid Rankin Score: Moderately severe disability Modified Rankin: Severe disability     Balance Overall balance assessment: Needs assistance Sitting-balance support: No upper extremity supported;Single extremity supported Sitting balance-Leahy Scale: Fair Sitting balance - Comments: varied between needing Max A initially, to sitting with supervision.                                    Cognition Arousal/Alertness: Awake/alert Behavior During Therapy: Flat affect Overall Cognitive Status: Difficult to assess Area of Impairment: Attention;Following commands;Awareness                   Current Attention Level: Focused   Following Commands: Follows one step commands inconsistently   Awareness: Intellectual   General Comments: Followed 2 commands entire session. Answers some questions inappropriately. Mostly "yes" or "no"      Exercises      General Comments        Pertinent Vitals/Pain Pain Assessment: Faces Faces Pain Scale: Hurts little more Pain Location: generalized movement Pain Descriptors / Indicators: Grimacing;Guarding Pain Intervention(s): Monitored during session    Home  Living                      Prior Function            PT Goals (current goals can now be found in the care plan section) Acute Rehab PT Goals Patient Stated Goal: per daughter, to have mother respond to questions PT Goal Formulation: With family Time For Goal Achievement: March 24, 2021 Potential to Achieve Goals: Fair Progress towards PT goals: Progressing toward goals     Frequency    Min 3X/week      PT Plan Current plan remains appropriate    Co-evaluation PT/OT/SLP Co-Evaluation/Treatment: Yes Reason for Co-Treatment: Necessary to address cognition/behavior during functional activity;For patient/therapist safety;To address functional/ADL transfers PT goals addressed during session: Mobility/safety with mobility;Balance        AM-PAC PT "6 Clicks" Mobility   Outcome Measure  Help needed turning from your back to your side while in a flat bed without using bedrails?: Total Help needed moving from lying on your back to sitting on the side of a flat bed without using bedrails?: Total Help needed moving to and from a bed to a chair (including a wheelchair)?: Total Help needed standing up from a chair using your arms (e.g., wheelchair or bedside chair)?: Total Help needed to walk in hospital room?: Total Help needed climbing 3-5 steps with a railing? : Total 6 Click Score: 6    End of Session   Activity Tolerance: Patient tolerated treatment well Patient left: in bed;with call bell/phone within reach;with bed alarm set;with family/visitor present Nurse Communication: Mobility status PT Visit Diagnosis: Muscle weakness (generalized) (M62.81);Other abnormalities of gait and mobility (R26.89);Difficulty in walking, not elsewhere classified (R26.2);Other symptoms and signs involving the nervous system (R29.898)     Time: 1130-1156 PT Time Calculation (min) (ACUTE ONLY): 26 min  Charges:  $Therapeutic Activity: 8-22 mins                     Samiksha Pellicano A. Gilford Rile PT, DPT Acute Rehabilitation Services Pager [phone] Office [phone]    Linna Hoff 02/25/2021, 12:52 PM

## 2021-02-25 NOTE — Progress Notes (Signed)
SLP Cancellation Note  Patient Details Name: Dorothy Barker MRN: [id number] DOB: 1940-11-24   Cancelled treatment:       Reason Eval/Treat Not Completed: Patient declined, no reason specified (Pt was approached for treatment. However, pt was asleep upon SLP's entry, and pt's daughter at bedside requested that dysphagia and aphasia treatment be deferred to allow pt to rest. SLP will follow up later as schedule allows.)  Jamear Carbonneau I. Hardin Negus, [place], [place] Office number [phone] Pager [place] 02/25/2021, 12:39 PM

## 2021-02-26 DIAGNOSIS — Z515 Encounter for palliative care: Secondary | ICD-10-CM | POA: Diagnosis not present

## 2021-02-26 DIAGNOSIS — I10 Essential (primary) hypertension: Secondary | ICD-10-CM | POA: Diagnosis not present

## 2021-02-26 DIAGNOSIS — N1832 Chronic kidney disease, stage 3b: Secondary | ICD-10-CM | POA: Diagnosis not present

## 2021-02-26 DIAGNOSIS — E039 Hypothyroidism, unspecified: Secondary | ICD-10-CM | POA: Diagnosis not present

## 2021-02-26 DIAGNOSIS — U071 COVID-19: Secondary | ICD-10-CM | POA: Diagnosis not present

## 2021-02-26 DIAGNOSIS — Z66 Do not resuscitate: Secondary | ICD-10-CM | POA: Diagnosis not present

## 2021-02-26 LAB — RENAL FUNCTION PANEL
Albumin: 2.6 g/dL — ABNORMAL LOW (ref 3.5–5.0)
Anion gap: 9 (ref 5–15)
BUN: 25 mg/dL — ABNORMAL HIGH (ref 8–23)
CO2: 24 mmol/L (ref 22–32)
Calcium: 9.6 mg/dL (ref 8.9–10.3)
Chloride: 104 mmol/L (ref 98–111)
Creatinine, Ser: 1.27 mg/dL — ABNORMAL HIGH (ref 0.44–1.00)
GFR, Estimated: 42 mL/min — ABNORMAL LOW (ref >60)
Glucose, Bld: 130 mg/dL — ABNORMAL HIGH (ref 70–99)
Phosphorus: 3 mg/dL (ref 2.5–4.6)
Potassium: 3.6 mmol/L (ref 3.5–5.1)
Sodium: 137 mmol/L (ref 135–145)

## 2021-02-26 LAB — GLUCOSE, CAPILLARY
Glucose-Capillary: 132 mg/dL — ABNORMAL HIGH (ref 70–99)
Glucose-Capillary: 150 mg/dL — ABNORMAL HIGH (ref 70–99)
Glucose-Capillary: 126 mg/dL — ABNORMAL HIGH (ref 70–99)
Glucose-Capillary: 102 mg/dL — ABNORMAL HIGH (ref 70–99)

## 2021-02-26 LAB — CBC
HCT: 32.9 % — ABNORMAL LOW (ref 36.0–46.0)
Hemoglobin: 10.8 g/dL — ABNORMAL LOW (ref 12.0–15.0)
MCH: 29.9 pg (ref 26.0–34.0)
MCHC: 32.8 g/dL (ref 30.0–36.0)
MCV: 91.1 fL (ref 80.0–100.0)
Platelets: 175 10*3/uL (ref 150–400)
RBC: 3.61 MIL/uL — ABNORMAL LOW (ref 3.87–5.11)
RDW: 11.9 % (ref 11.5–15.5)
WBC: 10.9 10*3/uL — ABNORMAL HIGH (ref 4.0–10.5)
nRBC: 0 % (ref 0.0–0.2)

## 2021-02-26 LAB — HEPARIN LEVEL (UNFRACTIONATED): Heparin Unfractionated: 0.36 IU/mL (ref 0.30–0.70)

## 2021-02-26 LAB — MAGNESIUM: Magnesium: 1.6 mg/dL — ABNORMAL LOW (ref 1.7–2.4)

## 2021-02-26 MED ORDER — MAGNESIUM SULFATE 2 GM/50ML IV SOLN
2.0000 g | Freq: Once | INTRAVENOUS | Status: AC
  Administered 2021-02-26: 2 g via INTRAVENOUS
  Filled 2021-02-26: qty 50

## 2021-02-26 NOTE — TOC Progression Note (Signed)
Transition of Care [facility]) - Progression Note    Patient Details  Name: JANDI SWIGER MRN: [id number] Date of Birth: [birth year]  Transition of Care [facility]) CM/SW [place], [place] Phone Number: 02/26/2021, 2:10 PM  Clinical Narrative:   CSW received a text from patient's daughter asking about insurance coverage for hospice care. CSW met with daughter at bedside to discuss hospice care in more detail and answer questions. Daughter tearful during discussion and still considering all of her options. Daughter indicated that the patient had been more awake and interactive this morning, wondering if speech could come back and reassess. CSW sent speech therapist assigned a message about coming to see the patient. Daughter would like to see how patient does with speech therapy and if she may be cleared for a diet before making any final decisions on goals of care. CSW also saw palliative NP in the hallway headed to talk to the daughter and provided update on conversation that CSW had. CSW to continue to follow to support patient and family with transition of care.     Expected Discharge Plan: [place] Barriers to Discharge: Continued Medical Work up, Ship broker  Expected Discharge Plan and Services Expected Discharge Plan: [place] Choice: [place] arrangements for the past 2 months: Assisted Living Facility                                       Social Determinants of Health (SDOH) Interventions    Readmission Risk Interventions No flowsheet data found.

## 2021-02-26 NOTE — Progress Notes (Signed)
PROGRESS NOTE  Dorothy Barker ZOX:[id number] DOB: 04-Aug-1941   PCP: Earmon Phoenix, NP  Patient is from: ALF.  Uses wheelchair for most part but walks some distance with therapy.  DOA: 02/21/2021 LOS: 5  Chief complaints:  Chief Complaint  Patient presents with   Code Stroke     Brief Narrative / Interim history: 80 year old F with PMH of HTN, IDDM-2, a flutter on Pradaxa, PAD, multiple recent CVAs, cognitive impairment, HLD, pressure skin injury of bilateral heels and ambulatory dysfunction brought to ED with expressive aphasia and found to have acute left posterior insular stroke with scattered punctate foci of acute ischemia in posterior left MCA territory, near the parietal temporal junction and old right BG infarct.  CTA head and neck without emergent LVO but mild progression of severe stenosis of the left MCA mid M1 segment and proximal M2 segment, unchanged moderate to severe stenosis of the distal right M1 segment and unchanged severe stenosis of the left PCA P2 segment.  TTE and carotid US without significant finding.  Per neurology, concern about Pradaxa failure, and recommended adding low-dose aspirin.  Given grim long-term prognosis, palliative medicine consulted. Patient has persistent encephalopathy.  Repeat CT head showed extension of previously seen left MCA territory infarction, now involving the entire distribution of the inferior division of left MCA with mild swelling but no hemorrhage.   Subjective: Seen and examined earlier this morning.  No major events overnight or this morning.  She is awake and alert.  Able to tell me her name and to squeeze my fingers with her left hand but not with the right although she seems to be moving both extremities.  Difficult to comprehend his speech.  Does not appear to be in distress.  Objective: Vitals:   02/25/21 2232 02/25/21 2307 02/26/21 0406 02/26/21 0819  BP: (!) 177/70 (!) 163/95 (!) 189/73 (!) 199/74  Pulse: 80 83 78 75  Resp: 18  16 16 16   Temp: 99 F (37.2 C) 99.1 F (37.3 C) 97.8 F (36.6 C) 98.3 F (36.8 C)  TempSrc: Axillary Axillary Oral   SpO2: 97% 96% 98% 98%  Weight:      Height:        Intake/Output Summary (Last 24 hours) at 02/26/2021 1210 Last data filed at 02/26/2021 0600 Gross per 24 hour  Intake 1826.95 ml  Output 750 ml  Net 1076.95 ml   Filed Weights   02/21/21 2156  Weight: 78.9 kg    Examination: GENERAL: No apparent distress.  Nontoxic. HEENT: MMM.  Vision and hearing grossly intact.  NECK: Supple.  No apparent JVD.  RESP: On RA.  No IWOB.  Fair aeration bilaterally. CVS:  RRR. Heart sounds normal.  ABD/GI/GU: BS+. Abd soft, NTND.  MSK/EXT:  Moves extremities. No apparent deformity. No edema.  SKIN: Feet in Prevalon boots NEURO: Awake.  Oriented to self.  Seems to follow some command but not consistently.  PERRL.  No obvious facial asymmetry but limited exam.  PSYCH: Calm.  No distress or agitation.  Procedures:  None  Microbiology summarized: COVID-19 PCR positive but was positive on 6/19 Urine culture negative.  Assessment & Plan: Acute Ischemic Stroke-presented with expressive aphasia.  MRI brain with small acute infarct of the posterior L insula, few scattered punctate foci of acute ischemia in the posterior L MCA territory, near the parietal temporal junction, old right basal ganglia infarct and findings of chronic ischemic microangiopathy.  CTA neck and head with multiple relatively stable intracranial stenosis.  Repeat CT head with extension of left MCA CVA and mild edema.  TTE and carotid US without significant finding.  LDL 56.  A1c 8.4%.  Concern about Pradaxa failure.  Per Dr. Leonie Man, patient is not a candidate for interventional care. -Continue IV heparin  -Low-dose aspirin p.o. or rectal -Continue high intensity statin when able to take p.o. safely. -Permissive hypertension up to 220/110 given significant intracranial stenosis -We will start normalizing BP slowly  over the next 2 to 3 days -Continue PT/OT/SLP -Palliative care following.  Acute Metabolic Encephalopathy in patient with cognitive impairment-likely due to CVA with underlying cognitive impairment.  UA, folate, TSH, ammonia and urine culture unrevealing.  B12 low side of normal.  She is more awake.  Oriented to self and follows some commands but not consistently. -Treat treatable causes -Reorientation and delirium precautions. -Gentle IV fluid for hydration -Palliative care following.  Dysphagia-likely due to #1.  SLP recommended n.p.o. in the setting of encephalopathy -Requested SLP to reevaluate  COVID positive-was positive on 6/19 with mild/asymptomatic disease. -She is outside the 10 days window for isolation precaution.  CKD-3B/azotemia: Relatively stable. Recent Labs    02/06/21 1941 02/07/21 0831 02/08/21 0049 02/21/21 2049 02/21/21 2053 02/22/21 0810 02/23/21 1338 02/24/21 0116 02/25/21 0624 02/26/21 0057  BUN 32* 25* 30* 26* 27* 23 20 24* 26* 25*  CREATININE 1.30* 1.28* 1.36* 1.53* 1.50* 1.28* 1.31* 1.52* 1.46* 1.27*  -Gentle IV fluid as above.  Anemia of chronic disease: H&H relatively stable. Recent Labs    02/06/21 1941 02/07/21 0831 02/08/21 0049 02/21/21 2049 02/21/21 2053 02/22/21 0810 02/23/21 1338 02/24/21 0116 02/25/21 0624 02/26/21 0057  HGB 12.2 11.2* 11.6* 11.4* 11.9* 10.9* 11.1* 11.0* 10.7* 10.8*  -Monitor intermittently  Microscopic hematuria-urine culture negative. -Repeat UA outpatient   Hypertension: BP within acceptable range for permissive hypertension. -Continue permissive hypertension up to 220/110 given significant intracranial stenosis -We will start normalizing slowly over the next 2 to 3 days   Hypothyroidism -Continue IV Synthroid    Atrial Flutter: Rate controlled.  Rate controlled off medication. -Anticoagulation as above  Hypomagnesemia: Mg 1.6. -Replenish and recheck   Uncontrolled IDDM-2 with hyperglycemia: A1c  8.4%.   Recent Labs  Lab 02/25/21 0618 02/25/21 1212 02/25/21 1815 02/25/21 2059 02/26/21 0613  GLUCAP 111* 142* 106* 109* 132*  -Continue current insulin regimen  Debility/ambulatory dysfunction -PT/OT-recommended SNF  Goal of care counseling: Poor long-term prognosis.  She is still full code.  Discussed risk and benefit of aggressive measures such as CPR and intubation with patient's daughter.  I am not sure if those measures are to patient's best interest.  -Palliative care following.  Class I obesity Body mass index is 33.97 kg/m.       Pressure skin injury: POA Pressure Injury 02/07/21 Heel Left Unstageable - Full thickness tissue loss in which the base of the injury is covered by slough (yellow, tan, gray, green or brown) and/or eschar (tan, brown or black) in the wound bed. (Active)  02/07/21 1222  Location: Heel  Location Orientation: Left  Staging: Unstageable - Full thickness tissue loss in which the base of the injury is covered by slough (yellow, tan, gray, green or brown) and/or eschar (tan, brown or black) in the wound bed.  Wound Description (Comments):   Present on Admission: Yes   DVT prophylaxis:  SCD's Start: 02/22/21 0231  Code Status: Full code Family Communication: Patient and/or RN.  Updated patient's daughter at bedside. Level of care: Telemetry Medical Status is: Inpatient  Remains inpatient appropriate because:Altered mental status, Unsafe d/c plan, and Inpatient level of care appropriate due to severity of illness  Dispo: The patient is from: ALF              Anticipated d/c is to:  To be determined.              Patient currently is not medically stable to d/c.   Difficult to place patient No       Consultants:  Neurology-signed off Palliative medicine   Sch Meds:  Scheduled Meds:   stroke: mapping our early stages of recovery book   Does not apply Once   aspirin EC  81 mg Oral Daily   Or   aspirin  150 mg Rectal Daily    atorvastatin  40 mg Oral QHS   collagenase   Topical Daily   insulin aspart  0-9 Units Subcutaneous TID WC   insulin glargine  15 Units Subcutaneous QHS   levothyroxine  25 mcg Intravenous Daily   pantoprazole (PROTONIX) IV  40 mg Intravenous QHS   Continuous Infusions:  heparin 1,050 Units/hr (02/26/21 0600)   lactated ringers 60 mL/hr at 02/26/21 0600   PRN Meds:.acetaminophen **OR** acetaminophen (TYLENOL) oral liquid 160 mg/5 mL **OR** acetaminophen, labetalol, ondansetron (ZOFRAN) IV, polyethylene glycol  Antimicrobials: Anti-infectives (From admission, onward)    None        I have personally reviewed the following labs and images: CBC: Recent Labs  Lab 02/21/21 2049 02/21/21 2053 02/22/21 0810 02/23/21 1338 02/24/21 0116 02/25/21 0624 02/26/21 0057  WBC 11.1*  --  9.3 9.1 8.9 9.1 10.9*  NEUTROABS 6.8  --  6.2 5.7  --   --   --   HGB 11.4*   < > 10.9* 11.1* 11.0* 10.7* 10.8*  HCT 34.7*   < > 33.3* 33.4* 33.0* 32.6* 32.9*  MCV 89.4  --  89.8 88.6 88.9 90.1 91.1  PLT 244  --  221 230 213 189 175   < > = values in this interval not displayed.   BMP &GFR Recent Labs  Lab 02/22/21 0810 02/23/21 1338 02/24/21 0116 02/25/21 0624 02/26/21 0057  NA 134* 137 137 138 137  K 3.5 3.4* 3.8 3.6 3.6  CL 99 103 103 102 104  CO2 26 26 25 23 24   GLUCOSE 206* 154* 118* 116* 130*  BUN 23 20 24* 26* 25*  CREATININE 1.28* 1.31* 1.52* 1.46* 1.27*  CALCIUM 9.9 9.8 9.9 9.9 9.6  MG  --  1.6* 1.9 1.7 1.6*  PHOS  --  3.1 3.6 3.0 3.0   Estimated Creatinine Clearance: 32.3 mL/min (A) (by C-G formula based on SCr of 1.27 mg/dL (H)). Liver & Pancreas: Recent Labs  Lab 02/21/21 2049 02/22/21 0810 02/23/21 1338 02/24/21 0116 02/25/21 0624 02/26/21 0057  AST 20 19 20   --   --   --   ALT 18 15 17   --   --   --   ALKPHOS 64 57 58  --   --   --   BILITOT 0.6 0.9 0.9  --   --   --   PROT 6.4* 5.8* 6.0*  --   --   --   ALBUMIN 3.0* 3.0* 2.9* 2.8* 2.9* 2.6*   No results for  input(s): LIPASE, AMYLASE in the last 168 hours. Recent Labs  Lab 02/22/21 0307 02/25/21 0624  AMMONIA 21 16   Diabetic: No results for input(s): HGBA1C in the last 72 hours.  Recent Labs  Lab 02/25/21 0618 02/25/21 1212 02/25/21 1815 02/25/21 2059 02/26/21 0613  GLUCAP 111* 142* 106* 109* 132*   Cardiac Enzymes: No results for input(s): CKTOTAL, CKMB, CKMBINDEX, TROPONINI in the last 168 hours. No results for input(s): PROBNP in the last 8760 hours. Coagulation Profile: Recent Labs  Lab 02/21/21 2049  INR 1.7*   Thyroid Function Tests: No results for input(s): TSH, T4TOTAL, FREET4, T3FREE, THYROIDAB in the last 72 hours.  Lipid Profile: No results for input(s): CHOL, HDL, LDLCALC, TRIG, CHOLHDL, LDLDIRECT in the last 72 hours.  Anemia Panel: No results for input(s): VITAMINB12, FOLATE, FERRITIN, TIBC, IRON, RETICCTPCT in the last 72 hours.  Urine analysis:    Component Value Date/Time   COLORURINE STRAW (A) 02/21/2021 2151   APPEARANCEUR CLEAR 02/21/2021 2151   LABSPEC 1.011 02/21/2021 2151   PHURINE 6.0 02/21/2021 2151   GLUCOSEU >=500 (A) 02/21/2021 2151   HGBUR MODERATE (A) 02/21/2021 2151   BILIRUBINUR NEGATIVE 02/21/2021 2151   [place] NEGATIVE 02/21/2021 2151   PROTEINUR 100 (A) 02/21/2021 2151   NITRITE NEGATIVE 02/21/2021 2151   LEUKOCYTESUR NEGATIVE 02/21/2021 2151   Sepsis Labs: Invalid input(s): PROCALCITONIN, [place]  Microbiology: Recent Results (from the past 240 hour(s))  Urine Culture     Status: None   Collection Time: 02/21/21  9:51 PM   Specimen: Urine, Random  Result Value Ref Range Status   Specimen Description URINE, RANDOM  Final   Special Requests NONE  Final   Culture   Final    NO GROWTH Performed at [facility], [address] [place], [place] [address]    Report Status 02/23/2021 FINAL  Final  SARS CORONAVIRUS 2 (TAT 6-24 HRS) Nasopharyngeal Nasopharyngeal Swab     Status: Abnormal   Collection Time:  02/22/21 12:59 PM   Specimen: Nasopharyngeal Swab  Result Value Ref Range Status   SARS Coronavirus 2 POSITIVE (A) NEGATIVE Final    Comment: (NOTE) SARS-CoV-2 target nucleic acids are DETECTED.  The SARS-CoV-2 RNA is generally detectable in upper and lower respiratory specimens during the acute phase of infection. Positive results are indicative of the presence of SARS-CoV-2 RNA. Clinical correlation with patient history and other diagnostic information is  necessary to determine patient infection status. Positive results do not rule out bacterial infection or co-infection with other viruses.  The expected result is Negative.  Fact Sheet for Patients: SugarRoll.be  Fact Sheet for Healthcare Providers: https://www.woods-mathews.com/  This test is not yet approved or cleared by the [place] FDA and  has been authorized for detection and/or diagnosis of SARS-CoV-2 by FDA under an Emergency Use Authorization (EUA). This EUA will remain  in effect (meaning this test can be used) for the duration of the COVID-19 declaration under Section 564(b)(1) of the Act, 21 U. S.C. section 360bbb-3(b)(1), unless the authorization is terminated or revoked sooner.   Performed at [facility], [place] [address], [place], [place] [address]     Radiology Studies: No results found.   Lundynn Cohoon T. Chambers  If 7PM-7AM, please contact night-coverage [email or web address] 02/26/2021, 12:10 PM

## 2021-02-26 NOTE — Progress Notes (Signed)
  Speech Language Pathology Treatment: Dysphagia  Patient Details Name: Dorothy Barker MRN: [id number] DOB: 1941/03/18 Today's Date: 02/26/2021 Time: 0277-4128 SLP Time Calculation (min) (ACUTE ONLY): 18 min  Assessment / Plan / Recommendation Clinical Impression  Pt's daughter is in discussions with Palliative care and MD re: pt's status and plan of care. Daughter is hopeful for any improvement. Pt's alertness waxes and wanes frequently even during treatment session. SLP was unable to arouse pt this am and several hours later notified by Palliative PA she was awake. On return her eyes are open, intermittently smiling at daughter, nods head occasionally. Intermittently vocalizing " now, come on" throughout. She accepted straw sips water and chocolate pudding (3 bites) followed by facial grimace (? taste). Returned to room with Diet Jonna Coup and was evident lethargy was returning. Could not motor plan with straw, eyes not as focused and therapist piped in small amount via straw but could not use straw when reattempted. Swallows were unremarkable and educated daughter on subjective assessment of swallow in light of her mom's large stroke. Discussed recommendation to allow floor stock of purees, thin liquids when she is awake and intake will be minimal, not sustaining. Will continue briefly.    HPI HPI: Pt is an 80 yo female presenting with acute onset ahasia. MRI revealed small acute infarct of the posterior left insula and a few scattered punctate foci of acute ischemia in the posterior left MCA territory. Pt initially passed a Yale swallow screen but there was concern about swallowing safety given decreased LOA and aphasia. CT head 7/7: Extension of the previously seen left MCA territory infarction, now  involving the entire distribution of the inferior division of the  left MCA. Low-density throughout that region with mild swelling. PMT 7/8: family would like full scope, pt full code. PMH includes: DM, HLD,  CKD, strokes including recent large acute L cerebellar stroke in June 2022, recent COVID (June 2022)      SLP Plan  Continue with current plan of care       Recommendations  Diet recommendations: Thin liquid;Other(comment) (floor stock puree) Liquids provided via: Teaspoon;Straw Medication Administration: Via alternative means Supervision: Staff to assist with self feeding;Full supervision/cueing for compensatory strategies Compensations: Slow rate;Small sips/bites Postural Changes and/or Swallow Maneuvers: Seated upright 90 degrees                Oral Care Recommendations: Oral care BID Follow up Recommendations:  (TBD) SLP Visit Diagnosis: Dysphagia, unspecified (R13.10) Plan: Continue with current plan of care       GO                Houston Siren 02/26/2021, 3:24 PM Orbie Pyo Colvin Caroli.Ed Risk analyst [phone] Office [phone]

## 2021-02-26 NOTE — Progress Notes (Signed)
Daily Progress Note   Patient Name: Dorothy Barker       Date: 02/26/2021 DOB: [birth year]  Age: 80 y.o. MRN#: [id number] Attending Physician: Mercy Riding, MD Primary Care Physician: Earmon Phoenix, NP Admit Date: 02/21/2021  Reason for Consultation/Follow-up:  To discuss complex medical decision making related to patient's goals of care  Subjective: Met patient's daughter and grand daughter at bedside.  Carla and I adjourned to a conference room to talk.  Angela Nevin has been thinking very hard about her mother, her situation, and what is in her best interest.  She easily becomes tearful.  She is so torn.  We talk for a while about the choices again.  Angela Nevin feels that if her mother fails the swallow evaluation again she will opt for Hospice facility for her.    We talked about code status.   Carla read "Hard Choices" and feels it was helpful.  She understands DNR is a protective measure to employ at the very end of life to save further pain and suffering.  She is agreeable to DNR code status for her mother.  Angela Nevin received a phone call from her daughter Sherrine Maples that Mrs. Scipio was awake to we quickly returned to the room for Angela Nevin to visit with her mother.    It is quite obvious how much Colin Mulders and Mrs. Guercio love each other.   Assessment: Patient awake and comfortable.  Unable to speak.  Waiting for SLP assessment.  Family struggling with further decisions about next steps.   Patient Profile/HPI: 80 y.o. female  with past medical history of kidney cancer, chronic kidney disease stage IIIb, diabetes mellitus, hypothyroidism, atrial flutter, multiple small strokes in the past, grade 1 diastolic dysfunction and recent [place] admission who was admitted on 02/21/2021 with encephalopathy and garbled  speech.  She was found to have multiple small scattered CVAs.  A second CT was done on July 7 that showed extension of her CVA covering a significant portion of her left lower brain.  Currently the patient is still quite encephalopathic she is eating and drinking very little.  She is having difficulties with significant cough when she does take p.o.   Length of Stay: 5   Vital Signs: BP (!) 183/72 (BP Location: Right Arm)   Pulse 89   Temp 99.3 F (  37.4 C) (Oral)   Resp 14   Ht 5' (1.524 m)   Wt 78.9 kg   SpO2 96%   BMI 33.97 kg/m  SpO2: SpO2: 96 % O2 Device: O2 Device: Room Air O2 Flow Rate:         Palliative Assessment/Data: 20%     Palliative Care Plan    Recommendations/Plan: PMT will continue to follow with you to support patient and family.  Code Status:  DNR  Prognosis:  Will likely be determined by her ability to take PO.  Discharge Planning: To Be Determined  Care plan was discussed with Family  Thank you for allowing the Palliative Medicine Team to assist in the care of this patient.  Total time spent:  35 min     Greater than 50%  of this time was spent counseling and coordinating care related to the above assessment and plan.  Florentina Jenny, PA-C Palliative Medicine  Please contact Palliative MedicineTeam phone at [phone] for questions and concerns between 7 am - 7 pm.   Please see AMION for individual provider pager numbers.

## 2021-02-26 NOTE — Progress Notes (Signed)
ANTICOAGULATION CONSULT NOTE - Follow Up Consult  Pharmacy Consult for IV Heparin (Pradaxa on hold) Indication: atrial fibrillation and stroke  Allergies  Allergen Reactions   Tramadol Other (See Comments)    Syncopal episodes    Amlodipine Besy-Benazepril Hcl Rash   Codeine Rash   Lotensin [Benazepril] Rash   Naproxen Rash    Patient Measurements: Height: 5' (152.4 cm) Weight: 78.9 kg (173 lb 15.1 oz) IBW/kg (Calculated) : 45.5 Heparin Dosing Weight: 63.5 kg  Vital Signs: Temp: 98.3 F (36.8 C) (07/09 0819) Temp Source: Oral (07/09 0406) BP: 199/74 (07/09 0819) Pulse Rate: 75 (07/09 0819)  Labs: Recent Labs    02/24/21 0116 02/24/21 1934 02/25/21 0624 02/25/21 1458 02/26/21 0057  HGB 11.0*  --  10.7*  --  10.8*  HCT 33.0*  --  32.6*  --  32.9*  PLT 213  --  189  --  175  HEPARINUNFRC  --    < > 0.49 0.45 0.36  CREATININE 1.52*  --  1.46*  --  1.27*   < > = values in this interval not displayed.    Estimated Creatinine Clearance: 32.3 mL/min (A) (by C-G formula based on SCr of 1.27 mg/dL (H)).   Assessment: 80 year old female admitted with aflutter and found to have acute ischemic stroke after another recent stroke on 02/07/21. Patient was taking Pradaxa 150 mg PO BID PTA. She received 2 doses upon admission then it was discontinued due to inability to tolerate PO meds, possibly secondary to encephalopathy. Last Pradaxa dose was at ~5pm on 7/6. Heparin infusion was started on 7/6 at 750 units/hour with a HL of 0.16. Heparin was discontinued and Pradaxa was restarted and discontinued again due to patient still being unable to tolerate PO meds.  Heparin was then resumed at 900 units/hour with a HL of 0.12. Rate was increased to 1050 units/hour. Most recent HL 0.36 on 7/9.  Goal of Therapy:  Heparin level 0.3-0.7 units/ml Monitor platelets by anticoagulation protocol: Yes   Plan:  Continue heparin at 1050 units/hr Daily HL (0.3-0.5) and CBC  Liz Beach,  PharmD Pharmacy Resident 02/26/2021,8:19 AM

## 2021-02-27 DIAGNOSIS — N1832 Chronic kidney disease, stage 3b: Secondary | ICD-10-CM | POA: Diagnosis not present

## 2021-02-27 DIAGNOSIS — Z515 Encounter for palliative care: Secondary | ICD-10-CM | POA: Diagnosis not present

## 2021-02-27 DIAGNOSIS — E039 Hypothyroidism, unspecified: Secondary | ICD-10-CM | POA: Diagnosis not present

## 2021-02-27 DIAGNOSIS — U071 COVID-19: Secondary | ICD-10-CM | POA: Diagnosis not present

## 2021-02-27 DIAGNOSIS — I10 Essential (primary) hypertension: Secondary | ICD-10-CM | POA: Diagnosis not present

## 2021-02-27 DIAGNOSIS — Z66 Do not resuscitate: Secondary | ICD-10-CM

## 2021-02-27 LAB — MAGNESIUM: Magnesium: 1.8 mg/dL (ref 1.7–2.4)

## 2021-02-27 LAB — CBC
HCT: 33.5 % — ABNORMAL LOW (ref 36.0–46.0)
Hemoglobin: 10.9 g/dL — ABNORMAL LOW (ref 12.0–15.0)
MCH: 29.1 pg (ref 26.0–34.0)
MCHC: 32.5 g/dL (ref 30.0–36.0)
MCV: 89.3 fL (ref 80.0–100.0)
Platelets: 189 10*3/uL (ref 150–400)
RBC: 3.75 MIL/uL — ABNORMAL LOW (ref 3.87–5.11)
RDW: 12.1 % (ref 11.5–15.5)
WBC: 8.5 10*3/uL (ref 4.0–10.5)
nRBC: 0 % (ref 0.0–0.2)

## 2021-02-27 LAB — RENAL FUNCTION PANEL
Albumin: 2.6 g/dL — ABNORMAL LOW (ref 3.5–5.0)
Anion gap: 13 (ref 5–15)
BUN: 21 mg/dL (ref 8–23)
CO2: 22 mmol/L (ref 22–32)
Calcium: 9.9 mg/dL (ref 8.9–10.3)
Chloride: 103 mmol/L (ref 98–111)
Creatinine, Ser: 1.15 mg/dL — ABNORMAL HIGH (ref 0.44–1.00)
GFR, Estimated: 48 mL/min — ABNORMAL LOW (ref >60)
Glucose, Bld: 134 mg/dL — ABNORMAL HIGH (ref 70–99)
Phosphorus: 2.4 mg/dL — ABNORMAL LOW (ref 2.5–4.6)
Potassium: 3.4 mmol/L — ABNORMAL LOW (ref 3.5–5.1)
Sodium: 138 mmol/L (ref 135–145)

## 2021-02-27 LAB — HEPARIN LEVEL (UNFRACTIONATED): Heparin Unfractionated: 0.59 IU/mL (ref 0.30–0.70)

## 2021-02-27 LAB — GLUCOSE, CAPILLARY: Glucose-Capillary: 161 mg/dL — ABNORMAL HIGH (ref 70–99)

## 2021-02-27 MED ORDER — LORAZEPAM 2 MG/ML IJ SOLN: 1.0000 mg | INTRAMUSCULAR | Status: DC | PRN

## 2021-02-27 MED ORDER — GLYCOPYRROLATE 0.2 MG/ML IJ SOLN: 0.2000 mg | INTRAMUSCULAR | Status: DC | PRN

## 2021-02-27 MED ORDER — MORPHINE SULFATE (PF) 2 MG/ML IV SOLN: 1.0000 mg | INTRAVENOUS | Status: DC | PRN

## 2021-02-27 MED ORDER — HALOPERIDOL LACTATE 2 MG/ML PO CONC
0.5000 mg | ORAL | Status: DC | PRN
  Filled 2021-02-27: qty 0.3

## 2021-02-27 MED ORDER — ONDANSETRON 4 MG PO TBDP: 4.0000 mg | ORAL_TABLET | Freq: Four times a day (QID) | ORAL | Status: DC | PRN

## 2021-02-27 MED ORDER — POTASSIUM CHLORIDE 10 MEQ/100ML IV SOLN
10.0000 meq | Freq: Once | INTRAVENOUS | Status: AC
  Administered 2021-02-27: 10 meq via INTRAVENOUS
  Filled 2021-02-27: qty 100

## 2021-02-27 MED ORDER — HALOPERIDOL 0.5 MG PO TABS: 0.5000 mg | ORAL_TABLET | ORAL | Status: DC | PRN

## 2021-02-27 MED ORDER — ONDANSETRON HCL 4 MG/2ML IJ SOLN: 4.0000 mg | Freq: Four times a day (QID) | INTRAMUSCULAR | Status: DC | PRN

## 2021-02-27 MED ORDER — HALOPERIDOL LACTATE 5 MG/ML IJ SOLN: 0.5000 mg | INTRAMUSCULAR | Status: DC | PRN

## 2021-02-27 MED ORDER — ONDANSETRON HCL 4 MG/2ML IJ SOLN
4.0000 mg | Freq: Four times a day (QID) | INTRAMUSCULAR | Status: DC | PRN
  Administered 2021-02-27 (×2): 4 mg via INTRAVENOUS
  Filled 2021-02-27: qty 2

## 2021-02-27 MED ORDER — GLYCOPYRROLATE 1 MG PO TABS
1.0000 mg | ORAL_TABLET | ORAL | Status: DC | PRN
  Filled 2021-02-27: qty 1

## 2021-02-27 MED ORDER — LORAZEPAM 1 MG PO TABS: 1.0000 mg | ORAL_TABLET | ORAL | Status: DC | PRN

## 2021-02-27 MED ORDER — LORAZEPAM 2 MG/ML PO CONC
1.0000 mg | ORAL | Status: DC | PRN
  Filled 2021-02-27: qty 0.5

## 2021-02-27 MED ORDER — HYDROMORPHONE HCL 1 MG/ML IJ SOLN: 0.5000 mg | INTRAMUSCULAR | Status: DC | PRN

## 2021-02-27 MED ORDER — ACETAMINOPHEN 650 MG RE SUPP: 650.0000 mg | Freq: Four times a day (QID) | RECTAL | Status: DC | PRN

## 2021-02-27 MED ORDER — LACTATED RINGERS IV SOLN: INTRAVENOUS | Status: DC

## 2021-02-27 MED ORDER — POLYVINYL ALCOHOL 1.4 % OP SOLN: 1.0000 [drp] | Freq: Four times a day (QID) | OPHTHALMIC | Status: DC | PRN

## 2021-02-27 MED ORDER — ACETAMINOPHEN 325 MG PO TABS: 650.0000 mg | ORAL_TABLET | Freq: Four times a day (QID) | ORAL | Status: DC | PRN

## 2021-02-27 MED ORDER — KCL-LACTATED RINGERS 20 MEQ/L IV SOLN
INTRAVENOUS | Status: DC
  Filled 2021-02-27: qty 1000

## 2021-02-27 MED ORDER — POLYVINYL ALCOHOL 1.4 % OP SOLN
1.0000 [drp] | Freq: Four times a day (QID) | OPHTHALMIC | Status: DC | PRN
  Filled 2021-02-27: qty 15

## 2021-02-27 MED ORDER — POTASSIUM CHLORIDE 2 MEQ/ML IV SOLN
INTRAVENOUS | Status: DC
  Filled 2021-02-27: qty 1000

## 2021-02-27 MED ORDER — GLYCOPYRROLATE 0.2 MG/ML IJ SOLN
0.2000 mg | INTRAMUSCULAR | Status: DC | PRN
  Administered 2021-02-27: 0.2 mg via INTRAVENOUS
  Filled 2021-02-27: qty 1

## 2021-02-27 MED ORDER — POTASSIUM CHLORIDE 10 MEQ/100ML IV SOLN
10.0000 meq | INTRAVENOUS | Status: DC
  Administered 2021-02-27: 10 meq via INTRAVENOUS
  Filled 2021-02-27 (×2): qty 100

## 2021-02-27 MED ORDER — HALOPERIDOL LACTATE 2 MG/ML PO CONC: 0.5000 mg | ORAL | Status: DC | PRN

## 2021-02-27 MED ORDER — ONDANSETRON HCL 4 MG/2ML IJ SOLN
4.0000 mg | Freq: Four times a day (QID) | INTRAMUSCULAR | Status: DC | PRN
  Filled 2021-02-27: qty 2

## 2021-02-27 MED ORDER — BIOTENE DRY MOUTH MT LIQD: 15.0000 mL | OROMUCOSAL | Status: DC | PRN

## 2021-02-27 MED ORDER — LORAZEPAM 2 MG/ML PO CONC: 1.0000 mg | ORAL | Status: DC | PRN

## 2021-02-27 MED ORDER — MORPHINE SULFATE (PF) 2 MG/ML IV SOLN
1.0000 mg | INTRAVENOUS | Status: DC | PRN
  Administered 2021-03-01: 1 mg via INTRAVENOUS
  Filled 2021-02-27: qty 1

## 2021-02-27 MED ORDER — GLYCOPYRROLATE 1 MG PO TABS: 1.0000 mg | ORAL_TABLET | ORAL | Status: DC | PRN

## 2021-02-27 NOTE — TOC Progression Note (Signed)
Transition of Care [facility]) - Progression Note    Patient Details  Name: Dorothy Barker MRN: [id number] Date of Birth: Feb 17, 1941  Transition of [facility] CM/SW [place], [place] Phone Number: 02/27/2021, 12:13 PM  Clinical Narrative:    CSW was notified by MD that the family would like to move forward with comfort care at this time. CSW met with daughter and granddaughter at bedside and they confirmed they would like [employer]. CSW answered all questions. CSW advised family that if anymore needs arise, CSW  will be available. CSW made referral to [facility] with [facility], she noted no available beds today, but will speak with family. SW will continue to follow for any further needs.   Expected Discharge Plan: [place] Barriers to Discharge: Continued Medical Work up, Ship broker  Expected Discharge Plan and Services Expected Discharge Plan: [place] Choice: [place] arrangements for the past 2 months: Assisted Living Facility                                       Social Determinants of Health (SDOH) Interventions    Readmission Risk Interventions No flowsheet data found.

## 2021-02-27 NOTE — Progress Notes (Signed)
Manufacturing engineer [facility]) Hospital Liaison note.    Received request from Virgie for family interest in [facility]. Left VM for daughter Angela Nevin.   [facility] is unable to offer a room today. Hospital Liaison will follow up tomorrow or sooner if a room becomes available and eligibility is confirmed.   Please do not hesitate to call with questions.    Thank you,   Farrel Gordon, RN, [facility] Liaison   [phone]

## 2021-02-27 NOTE — Progress Notes (Signed)
Per spiritual care consult, chaplain provided emotional and grief support to daughter and two of three granddaughters, who were bedside.  Pt was not awake, had labored breathing.  Family was struggling to come to terms with the implications of the transition to comfort care. Granddaughter expressed shock when she understood the decline of pt's condition; daughter expressed feeling guilty; however, family members demonstrated mutual support and comfort with one another.  All were showing signs of anticipatory grief, to be expected. Family has ongoing questions about PC process even though they remember everything had been explained to them. Chaplain affirmed the normalcy of questions/information overload and sense of feeling overwhelmed.  Family says that pt likes prayer; chaplain offered prayer and blessing.    Please contact Dept. Of Spiritual Care as needed for ongoing support.   Luana Shu [phone]    02/27/21 1608  Clinical Encounter Type  Visited With Patient and family together  Visit Type Initial (Transition to comfort care)  Referral From Nurse  Consult/Referral To Chaplain  Spiritual Encounters  Spiritual Needs Prayer;Emotional;Grief support  Stress Factors  Patient Stress Factors Health changes  Family Stress Factors Loss of control;Family relationships

## 2021-02-27 NOTE — Progress Notes (Signed)
Daily Progress Note   Patient Name: Dorothy Barker       Date: 02/27/2021 DOB: 1941-01-29  Age: 80 y.o. MRN#: [id number] Attending Physician: Mercy Riding, MD Primary Care Physician: Earmon Phoenix, NP Admit Date: 02/21/2021  Reason for Consultation/Follow-up:  To discuss complex medical decision making related to patient's goals of care  Subjective: Met with Angela Nevin and patient at bedside twice today.  Family has gathered around and they have made a decision for comfort.  Angela Nevin remains tearful and emotional, but seems settled on the decision.  She requests a bed at [facility].  Repositioned patient with family, suctioned secretions.  Attempted to provide emotional support and assurance particularly to Angela Nevin who has always had a very close loving relationship with her mother.   Patient appears comfortable.  At one point she does cough and almost vomit her secretions.  This is remedied with repositioning and suction.  Family at bedside sharing stories.  [place] daughter created a beautiful frame with family pictures that patient enjoys looking at.   Patient Profile/HPI:  80 y.o. female  with past medical history of kidney cancer, chronic kidney disease stage IIIb, diabetes mellitus, hypothyroidism, atrial flutter, multiple small strokes in the past, grade 1 diastolic dysfunction and recent [place] admission who was admitted on 02/21/2021 with encephalopathy and garbled speech.  She was found to have multiple small scattered CVAs.  A second CT was done on July 7 that showed extension of her CVA covering a significant portion of her left lower brain.  Currently the patient is still quite encephalopathic she is eating and drinking very little.  She is having difficulties with significant cough when she does take  p.o.      Length of Stay: 6   Vital Signs: BP (!) 160/88 (BP Location: Right Arm)   Pulse 91   Temp 99 F (37.2 C) (Oral)   Resp 19   Ht 5' (1.524 m)   Wt 78.9 kg   SpO2 96%   BMI 33.97 kg/m  SpO2: SpO2: 96 % O2 Device: O2 Device: Room Air O2 Flow Rate:         Palliative Assessment/Data: 10%     Palliative Care Plan    Recommendations/Plan: Comfort care orders placed. Bed at [facility] requested. Patient and family will need  continued support from PMT and PMT Chaplain.  Code Status:  DNR  Prognosis:  < 2 weeks   Discharge Planning: Hospice facility  Care plan was discussed with family.  Thank you for allowing the Palliative Medicine Team to assist in the care of this patient.  Total time spent:  35 min.     Greater than 50%  of this time was spent counseling and coordinating care related to the above assessment and plan.  Florentina Jenny, PA-C Palliative Medicine  Please contact Palliative MedicineTeam phone at [phone] for questions and concerns between 7 am - 7 pm.   Please see AMION for individual provider pager numbers.

## 2021-02-27 NOTE — Progress Notes (Addendum)
ANTICOAGULATION CONSULT NOTE - Follow Up Consult  Pharmacy Consult for IV Heparin (Pradaxa on hold) Indication: atrial fibrillation and stroke  Allergies  Allergen Reactions   Tramadol Other (See Comments)    Syncopal episodes    Amlodipine Besy-Benazepril Hcl Rash   Codeine Rash   Lotensin [Benazepril] Rash   Naproxen Rash    Patient Measurements: Height: 5' (152.4 cm) Weight: 78.9 kg (173 lb 15.1 oz) IBW/kg (Calculated) : 45.5 Heparin Dosing Weight: 63.5 kg  Vital Signs: Temp: 99 F (37.2 C) (07/10 0811) Temp Source: Oral (07/10 0811) BP: 160/88 (07/10 0811) Pulse Rate: 91 (07/10 0811)  Labs: Recent Labs    02/25/21 0624 02/25/21 1458 02/26/21 0057 02/27/21 0253  HGB 10.7*  --  10.8* 10.9*  HCT 32.6*  --  32.9* 33.5*  PLT 189  --  175 189  HEPARINUNFRC 0.49 0.45 0.36 0.59  CREATININE 1.46*  --  1.27* 1.15*    Estimated Creatinine Clearance: 35.7 mL/min (A) (by C-G formula based on SCr of 1.15 mg/dL (H)).   Assessment: 80 year old female admitted with aflutter and found to have acute ischemic stroke after another recent stroke on 02/07/21. Patient was taking Pradaxa 150 mg PO BID PTA. She received 2 doses upon admission then it was discontinued due to inability to tolerate PO meds, possibly secondary to encephalopathy. Last Pradaxa dose was at ~5pm on 7/6. Heparin infusion was started on 7/6 at 750 units/hour with a HL of 0.16. Heparin was discontinued and Pradaxa was restarted and discontinued again due to patient still being unable to tolerate PO meds.   Heparin was then resumed at 900 units/hour with a HL of 0.12. Rate was increased to 1050 units/hour and the two levels following change were within goal range. Most recent HL 0.59 on 7/10, which is over the goal of 0.3-0.5.   Goal of Therapy:  Heparin level 0.3-0.5 units/ml Monitor platelets by anticoagulation protocol: Yes   Plan:  HL 0.59 (goal 0.3-0.5) Reduce heparin by ~1 unit/kg/hour Reduce rate to  Heparin 1000 units/hour  HL ordered for ~8 hours after rate change Daily HL (0.3-0.5) and CBC   Liz Beach, PharmD Pharmacy Resident 02/27/2021,8:55 AM

## 2021-02-27 NOTE — Progress Notes (Signed)
Pt family has declined a move to [facility]. Family is too tired and overwhelmed. Family feels it would be too much on her mother. Family stated they would possibly reconsider tomorrow.

## 2021-02-27 NOTE — Progress Notes (Signed)
PROGRESS NOTE  Dorothy Barker:[id number] DOB: 08-15-1941   PCP: Earmon Phoenix, NP  Patient is from: ALF.  Uses wheelchair for most part but walks some distance with therapy.  DOA: 02/21/2021 LOS: 6  Chief complaints:  Chief Complaint  Patient presents with   Code Stroke     Brief Narrative / Interim history: 80 year old F with PMH of HTN, IDDM-2, a flutter on Pradaxa, PAD, multiple recent CVAs, cognitive impairment, HLD, pressure skin injury of bilateral heels and ambulatory dysfunction brought to ED with expressive aphasia and found to have acute left posterior insular stroke with scattered punctate foci of acute ischemia in posterior left MCA territory, near the parietal temporal junction and old right BG infarct.  CTA head and neck without emergent LVO but mild progression of severe stenosis of the left MCA mid M1 segment and proximal M2 segment, unchanged moderate to severe stenosis of the distal right M1 segment and unchanged severe stenosis of the left PCA P2 segment.  TTE and carotid US without significant finding.  Per neurology, concern about Pradaxa failure, and recommended adding low-dose aspirin.  Given grim long-term prognosis, palliative medicine consulted. Patient has persistent encephalopathy.  Repeat CT head showed extension of previously seen left MCA territory infarction, now involving the entire distribution of the inferior division of left MCA with mild swelling but no hemorrhage.   After meeting with palliative care, CODE STATUS changed to DNR/DNI on 7/9.  Eventually, patient was transitioned to full comfort care on 7/10.  Plan is to transfer to residential hospice once eligibility and room availability confirmed.  Subjective: Seen and examined earlier this morning.  No major events overnight of this morning.  She is awake.  She will responds yes, no, leave me alone when I asked her name.  Patient's daughter at bedside, would like to proceed with comfort care so other family  member can visit before she gets weaker.  Patient does not appear to be in distress. We have discussed the nature and philosophy of comfort care including use of medications such as morphine, Ativan... As needed for pain, distress, anxiety or other symptoms.  Daughter is on board with residential hospice as long as patient is stable for transfer.  I have consulted TOC for residential hospice referral.  Objective: Vitals:   02/26/21 2134 02/27/21 0600 02/27/21 0640 02/27/21 0811  BP:  (!) 221/92 (!) 158/87 (!) 160/88  Pulse: 83 83 79 91  Resp: 18 18 18 19   Temp: 98.4 F (36.9 C)   99 F (37.2 C)  TempSrc: Oral   Oral  SpO2: 96% 98%  96%  Weight:      Height:        Intake/Output Summary (Last 24 hours) at 02/27/2021 1412 Last data filed at 02/27/2021 5852 Gross per 24 hour  Intake --  Output 600 ml  Net -600 ml   Filed Weights   02/21/21 2156  Weight: 78.9 kg    Examination:  GENERAL: No apparent distress.  HEENT: MMM.  Vision and hearing grossly intact.  NECK: Supple.  No apparent JVD.  RESP: On RA.  No IWOB.  Fair aeration bilaterally. CVS:  RRR. Heart sounds normal.  ABD/GI/GU: BS+. Abd soft, NTND.  MSK/EXT:  Moves extremities. No apparent deformity. No edema.  SKIN: Feet in Prevalon boots. NEURO: Awake.  Not able to assess orientation.  Does not follow command.  No obvious facial asymmetry.  Seems to favor her left side. PSYCH: Calm.  No distress or agitation.  Procedures:  None  Microbiology summarized: COVID-19 PCR positive but was positive on 6/19 Urine culture negative.  Assessment & Plan: End-of-life care/comfort measures only -Comfort pathway initiated.  Discussed with RN and palliative medicine -Visitation restricted lifted to allow other family members spend some time with patient -Comfort feeding  -TOC consulted for residential hospice referral  Acute Ischemic Stroke-presented with expressive aphasia.  Repeat CTA head showed extensive progression of  his CVA.  She has extensive diffuse intracranial stenosis.   Acute Metabolic Encephalopathy in patient with cognitive impairment-likely due to CVA with underlying cognitive impairment.   Dysphagia-likely due to #1.   COVID positive-was positive on 6/19 with mild/asymptomatic disease. -She is outside the 10 days window for isolation precaution.  CKD-3B/azotemia: Relatively stable.  Anemia of chronic disease: H&H relatively stable.  Microscopic hematuria-urine culture negative.   Hypertension: Slightly hypotensive.   Hypothyroidism   Atrial Flutter: Rate controlled.   Hypomagnesemia:    Uncontrolled IDDM-2 with hyperglycemia: A1c 8.4%.    Debility/ambulatory dysfunction  Goal of care counseling: Appreciate help by palliative care.   Class I obesity Body mass index is 33.97 kg/m.       Pressure skin injury: POA Pressure Injury 02/07/21 Heel Left Unstageable - Full thickness tissue loss in which the base of the injury is covered by slough (yellow, tan, gray, green or brown) and/or eschar (tan, brown or black) in the wound bed. (Active)  02/07/21 1222  Location: Heel  Location Orientation: Left  Staging: Unstageable - Full thickness tissue loss in which the base of the injury is covered by slough (yellow, tan, gray, green or brown) and/or eschar (tan, brown or black) in the wound bed.  Wound Description (Comments):   Present on Admission: Yes   DVT prophylaxis:    Code Status: DNR/DNI Family Communication: Updated patient's daughter, granddaughters and son-in-law at the bedside Level of care: Palliative Care Status is: Inpatient  Remains inpatient appropriate because:Unsafe d/c plan  Dispo: The patient is from: ALF              Anticipated d/c is to:  Residential hospice pending eligibility and room availability              Patient currently is medically stable for transfer to residential hospice once eligible and room available   Difficult to place patient  No       Consultants:  Neurology-signed off Palliative medicine   Sch Meds:  Scheduled Meds:   Continuous Infusions:   PRN Meds:.acetaminophen **OR** acetaminophen, antiseptic oral rinse, glycopyrrolate **OR** glycopyrrolate **OR** glycopyrrolate, haloperidol **OR** haloperidol **OR** haloperidol lactate, haloperidol **OR** haloperidol **OR** haloperidol lactate, HYDROmorphone (DILAUDID) injection, LORazepam **OR** LORazepam **OR** LORazepam, morphine injection, ondansetron **OR** ondansetron (ZOFRAN) IV, polyvinyl alcohol  Antimicrobials: Anti-infectives (From admission, onward)    None        I have personally reviewed the following labs and images: CBC: Recent Labs  Lab 02/21/21 2049 02/21/21 2053 02/22/21 0810 02/23/21 1338 02/24/21 0116 02/25/21 0624 02/26/21 0057 02/27/21 0253  WBC 11.1*  --  9.3 9.1 8.9 9.1 10.9* 8.5  NEUTROABS 6.8  --  6.2 5.7  --   --   --   --   HGB 11.4*   < > 10.9* 11.1* 11.0* 10.7* 10.8* 10.9*  HCT 34.7*   < > 33.3* 33.4* 33.0* 32.6* 32.9* 33.5*  MCV 89.4  --  89.8 88.6 88.9 90.1 91.1 89.3  PLT 244  --  221 230 213 189 175 189   < > =  values in this interval not displayed.   BMP &GFR Recent Labs  Lab 02/23/21 1338 02/24/21 0116 02/25/21 0624 02/26/21 0057 02/27/21 0253  NA 137 137 138 137 138  K 3.4* 3.8 3.6 3.6 3.4*  CL 103 103 102 104 103  CO2 26 25 23 24 22   GLUCOSE 154* 118* 116* 130* 134*  BUN 20 24* 26* 25* 21  CREATININE 1.31* 1.52* 1.46* 1.27* 1.15*  CALCIUM 9.8 9.9 9.9 9.6 9.9  MG 1.6* 1.9 1.7 1.6* 1.8  PHOS 3.1 3.6 3.0 3.0 2.4*   Estimated Creatinine Clearance: 35.7 mL/min (A) (by C-G formula based on SCr of 1.15 mg/dL (H)). Liver & Pancreas: Recent Labs  Lab 02/21/21 2049 02/22/21 0810 02/23/21 1338 02/24/21 0116 02/25/21 0624 02/26/21 0057 02/27/21 0253  AST 20 19 20   --   --   --   --   ALT 18 15 17   --   --   --   --   ALKPHOS 64 57 58  --   --   --   --   BILITOT 0.6 0.9 0.9  --   --   --    --   PROT 6.4* 5.8* 6.0*  --   --   --   --   ALBUMIN 3.0* 3.0* 2.9* 2.8* 2.9* 2.6* 2.6*   No results for input(s): LIPASE, AMYLASE in the last 168 hours. Recent Labs  Lab 02/22/21 0307 02/25/21 0624  AMMONIA 21 16   Diabetic: No results for input(s): HGBA1C in the last 72 hours.  Recent Labs  Lab 02/26/21 0613 02/26/21 1217 02/26/21 1602 02/26/21 2130 02/27/21 0630  GLUCAP 132* 150* 126* 102* 161*   Cardiac Enzymes: No results for input(s): CKTOTAL, CKMB, CKMBINDEX, TROPONINI in the last 168 hours. No results for input(s): PROBNP in the last 8760 hours. Coagulation Profile: Recent Labs  Lab 02/21/21 2049  INR 1.7*   Thyroid Function Tests: No results for input(s): TSH, T4TOTAL, FREET4, T3FREE, THYROIDAB in the last 72 hours.  Lipid Profile: No results for input(s): CHOL, HDL, LDLCALC, TRIG, CHOLHDL, LDLDIRECT in the last 72 hours.  Anemia Panel: No results for input(s): VITAMINB12, FOLATE, FERRITIN, TIBC, IRON, RETICCTPCT in the last 72 hours.  Urine analysis:    Component Value Date/Time   COLORURINE STRAW (A) 02/21/2021 2151   APPEARANCEUR CLEAR 02/21/2021 2151   LABSPEC 1.011 02/21/2021 2151   PHURINE 6.0 02/21/2021 2151   GLUCOSEU >=500 (A) 02/21/2021 2151   HGBUR MODERATE (A) 02/21/2021 2151   BILIRUBINUR NEGATIVE 02/21/2021 2151   [place] NEGATIVE 02/21/2021 2151   PROTEINUR 100 (A) 02/21/2021 2151   NITRITE NEGATIVE 02/21/2021 2151   LEUKOCYTESUR NEGATIVE 02/21/2021 2151   Sepsis Labs: Invalid input(s): PROCALCITONIN, [place]  Microbiology: Recent Results (from the past 240 hour(s))  Urine Culture     Status: None   Collection Time: 02/21/21  9:51 PM   Specimen: Urine, Random  Result Value Ref Range Status   Specimen Description URINE, RANDOM  Final   Special Requests NONE  Final   Culture   Final    NO GROWTH Performed at [facility], [address], [place],  [address]    Report Status 02/23/2021 FINAL  Final  SARS  CORONAVIRUS 2 (TAT 6-24 HRS) Nasopharyngeal Nasopharyngeal Swab     Status: Abnormal   Collection Time: 02/22/21 12:59 PM   Specimen: Nasopharyngeal Swab  Result Value Ref Range Status   SARS Coronavirus 2 POSITIVE (A) NEGATIVE Final  Comment: (NOTE) SARS-CoV-2 target nucleic acids are DETECTED.  The SARS-CoV-2 RNA is generally detectable in upper and lower respiratory specimens during the acute phase of infection. Positive results are indicative of the presence of SARS-CoV-2 RNA. Clinical correlation with patient history and other diagnostic information is  necessary to determine patient infection status. Positive results do not rule out bacterial infection or co-infection with other viruses.  The expected result is Negative.  Fact Sheet for Patients: SugarRoll.be  Fact Sheet for Healthcare Providers: https://www.woods-mathews.com/  This test is not yet approved or cleared by the [place] FDA and  has been authorized for detection and/or diagnosis of SARS-CoV-2 by FDA under an Emergency Use Authorization (EUA). This EUA will remain  in effect (meaning this test can be used) for the duration of the COVID-19 declaration under Section 564(b)(1) of the Act, 21 U. S.C. section 360bbb-3(b)(1), unless the authorization is terminated or revoked sooner.   Performed at [facility], [place] [address] [place],  [address]     Radiology Studies: No results found.   Ashayla Subia T. [place]  If 7PM-7AM, please contact night-coverage [email or web address] 02/27/2021, 2:12 PM

## 2021-02-28 DIAGNOSIS — G9341 Metabolic encephalopathy: Secondary | ICD-10-CM | POA: Diagnosis not present

## 2021-02-28 DIAGNOSIS — Z66 Do not resuscitate: Secondary | ICD-10-CM | POA: Diagnosis not present

## 2021-02-28 DIAGNOSIS — Z515 Encounter for palliative care: Secondary | ICD-10-CM | POA: Diagnosis not present

## 2021-02-28 NOTE — Progress Notes (Signed)
PROGRESS NOTE  PRISEIS CRATTY BJY:[id number] DOB: 1940/10/04   PCP: Earmon Phoenix, NP  Patient is from: ALF.  Uses wheelchair for most part but walks some distance with therapy.  DOA: 02/21/2021 LOS: 7  Chief complaints:  Chief Complaint  Patient presents with   Code Stroke     Brief Narrative / Interim history: 80 year old F with PMH of HTN, IDDM-2, a flutter on Pradaxa, PAD, multiple recent CVAs, cognitive impairment, HLD, pressure skin injury of bilateral heels and ambulatory dysfunction brought to ED with expressive aphasia and found to have acute left posterior insular stroke with scattered punctate foci of acute ischemia in posterior left MCA territory, near the parietal temporal junction and old right BG infarct.  CTA head and neck without emergent LVO but mild progression of severe stenosis of the left MCA mid M1 segment and proximal M2 segment, unchanged moderate to severe stenosis of the distal right M1 segment and unchanged severe stenosis of the left PCA P2 segment.  TTE and carotid US without significant finding.  Per neurology, concern about Pradaxa failure, and recommended adding low-dose aspirin.  Given grim long-term prognosis, palliative medicine consulted. Patient has persistent encephalopathy.  Repeat CT head showed extension of previously seen left MCA territory infarction, now involving the entire distribution of the inferior division of left MCA with mild swelling but no hemorrhage.   After meeting with palliative care, CODE STATUS changed to DNR/DNI on 7/9.  Eventually, patient was transitioned to full comfort care on 7/10.  Plan is to transfer to [facility] once room available.  Subjective: Seen and examined earlier this morning.  No major events overnight of this morning.  She is asleep.  Snoring but no apparent distress.  Patient's daughter at bedside.  Appreciative about her mother's care.   Objective: Vitals:   02/27/21 0640 02/27/21 0811 02/28/21 0730 02/28/21 0859   BP: (!) 158/87 (!) 160/88    Pulse: 79 91  97  Resp: 18 19    Temp:  99 F (37.2 C) 99.5 F (37.5 C) 98.2 F (36.8 C)  TempSrc:  Oral Axillary Oral  SpO2:  96%  94%  Weight:      Height:       No intake or output data in the 24 hours ending 02/28/21 1526  Filed Weights   02/21/21 2156  Weight: 78.9 kg    Examination:  GENERAL: Asleep.  Snoring but no apparent distress. RESP: Snoring but no IWOB.  MSK/EXT: No apparent deformity. No edema.  NEURO: Asleep. PSYCH: Calm.  No distress or agitation.  Procedures:  None  Microbiology summarized: COVID-19 PCR positive but was positive on 6/19 Urine culture negative.  Assessment & Plan: End-of-life care/comfort measures only -Continue comfort care.  No apparent distress.  Did not need as needed meds -Continue emotional support for family -Plan to transfer to [facility] once room available.  Acute Ischemic Stroke-presented with expressive aphasia.  Repeat CTA head showed extensive progression of his CVA.  She has extensive diffuse intracranial stenosis.   Acute Metabolic Encephalopathy in patient with cognitive impairment-likely due to CVA with underlying cognitive impairment.   Dysphagia-likely due to #1.   COVID positive-was positive on 6/19 with mild/asymptomatic disease.  CKD-3B/azotemia: Relatively stable.  Anemia of chronic disease: H&H relatively stable.  Microscopic hematuria-no UTI.  Urine culture negative.   Essential hypertension   Hypothyroidism   Atrial Flutter: Rate controlled.   Hypomagnesemia:    Uncontrolled IDDM-2 with hyperglycemia: A1c 8.4%.    Debility/ambulatory  dysfunction  Goal of care counseling: Appreciate help by palliative care.   Class I obesity Body mass index is 33.97 kg/m.       Pressure skin injury: POA Pressure Injury 02/07/21 Heel Left Unstageable - Full thickness tissue loss in which the base of the injury is covered by slough (yellow, tan, gray, green or brown)  and/or eschar (tan, brown or black) in the wound bed. (Active)  02/07/21 1222  Location: Heel  Location Orientation: Left  Staging: Unstageable - Full thickness tissue loss in which the base of the injury is covered by slough (yellow, tan, gray, green or brown) and/or eschar (tan, brown or black) in the wound bed.  Wound Description (Comments):   Present on Admission: Yes   DVT prophylaxis:    Code Status: DNR/DNI Family Communication: Patient's daughter at bedside. Level of care: Palliative Care Status is: Inpatient  Remains inpatient appropriate because:Unsafe d/c plan  Dispo: The patient is from: ALF              Anticipated d/c is to:  [facility]              Patient currently is medically stable for transfer to residential hospice once room available   Difficult to place patient No       Consultants:  Neurology-signed off Palliative medicine   Sch Meds:  Scheduled Meds:   Continuous Infusions:  lactated ringers Stopped (02/27/21 2206)    PRN Meds:.acetaminophen **OR** acetaminophen, antiseptic oral rinse, glycopyrrolate **OR** glycopyrrolate **OR** glycopyrrolate, haloperidol **OR** haloperidol **OR** haloperidol lactate, haloperidol **OR** haloperidol **OR** haloperidol lactate, HYDROmorphone (DILAUDID) injection, LORazepam **OR** LORazepam **OR** LORazepam, morphine injection, ondansetron **OR** ondansetron (ZOFRAN) IV, polyvinyl alcohol  Antimicrobials: Anti-infectives (From admission, onward)    None        I have personally reviewed the following labs and images: CBC: Recent Labs  Lab 02/21/21 2049 02/21/21 2053 02/22/21 0810 02/23/21 1338 02/24/21 0116 02/25/21 0624 02/26/21 0057 02/27/21 0253  WBC 11.1*  --  9.3 9.1 8.9 9.1 10.9* 8.5  NEUTROABS 6.8  --  6.2 5.7  --   --   --   --   HGB 11.4*   < > 10.9* 11.1* 11.0* 10.7* 10.8* 10.9*  HCT 34.7*   < > 33.3* 33.4* 33.0* 32.6* 32.9* 33.5*  MCV 89.4  --  89.8 88.6 88.9 90.1 91.1 89.3  PLT  244  --  221 230 213 189 175 189   < > = values in this interval not displayed.   BMP &GFR Recent Labs  Lab 02/23/21 1338 02/24/21 0116 02/25/21 0624 02/26/21 0057 02/27/21 0253  NA 137 137 138 137 138  K 3.4* 3.8 3.6 3.6 3.4*  CL 103 103 102 104 103  CO2 26 25 23 24 22   GLUCOSE 154* 118* 116* 130* 134*  BUN 20 24* 26* 25* 21  CREATININE 1.31* 1.52* 1.46* 1.27* 1.15*  CALCIUM 9.8 9.9 9.9 9.6 9.9  MG 1.6* 1.9 1.7 1.6* 1.8  PHOS 3.1 3.6 3.0 3.0 2.4*   Estimated Creatinine Clearance: 35.7 mL/min (A) (by C-G formula based on SCr of 1.15 mg/dL (H)). Liver & Pancreas: Recent Labs  Lab 02/21/21 2049 02/22/21 0810 02/23/21 1338 02/24/21 0116 02/25/21 0624 02/26/21 0057 02/27/21 0253  AST 20 19 20   --   --   --   --   ALT 18 15 17   --   --   --   --   ALKPHOS 64 57 58  --   --   --   --  BILITOT 0.6 0.9 0.9  --   --   --   --   PROT 6.4* 5.8* 6.0*  --   --   --   --   ALBUMIN 3.0* 3.0* 2.9* 2.8* 2.9* 2.6* 2.6*   No results for input(s): LIPASE, AMYLASE in the last 168 hours. Recent Labs  Lab 02/22/21 0307 02/25/21 0624  AMMONIA 21 16   Diabetic: No results for input(s): HGBA1C in the last 72 hours.  Recent Labs  Lab 02/26/21 0613 02/26/21 1217 02/26/21 1602 02/26/21 2130 02/27/21 0630  GLUCAP 132* 150* 126* 102* 161*   Cardiac Enzymes: No results for input(s): CKTOTAL, CKMB, CKMBINDEX, TROPONINI in the last 168 hours. No results for input(s): PROBNP in the last 8760 hours. Coagulation Profile: Recent Labs  Lab 02/21/21 2049  INR 1.7*   Thyroid Function Tests: No results for input(s): TSH, T4TOTAL, FREET4, T3FREE, THYROIDAB in the last 72 hours.  Lipid Profile: No results for input(s): CHOL, HDL, LDLCALC, TRIG, CHOLHDL, LDLDIRECT in the last 72 hours.  Anemia Panel: No results for input(s): VITAMINB12, FOLATE, FERRITIN, TIBC, IRON, RETICCTPCT in the last 72 hours.  Urine analysis:    Component Value Date/Time   COLORURINE STRAW (A) 02/21/2021  2151   APPEARANCEUR CLEAR 02/21/2021 2151   LABSPEC 1.011 02/21/2021 2151   PHURINE 6.0 02/21/2021 2151   GLUCOSEU >=500 (A) 02/21/2021 2151   HGBUR MODERATE (A) 02/21/2021 2151   BILIRUBINUR NEGATIVE 02/21/2021 2151   [place] NEGATIVE 02/21/2021 2151   PROTEINUR 100 (A) 02/21/2021 2151   NITRITE NEGATIVE 02/21/2021 2151   LEUKOCYTESUR NEGATIVE 02/21/2021 2151   Sepsis Labs: Invalid input(s): PROCALCITONIN, [place]  Microbiology: Recent Results (from the past 240 hour(s))  Urine Culture     Status: None   Collection Time: 02/21/21  9:51 PM   Specimen: Urine, Random  Result Value Ref Range Status   Specimen Description URINE, RANDOM  Final   Special Requests NONE  Final   Culture   Final    NO GROWTH Performed at [facility], [address], [place], [place] [address]    Report Status 02/23/2021 FINAL  Final  SARS CORONAVIRUS 2 (TAT 6-24 HRS) Nasopharyngeal Nasopharyngeal Swab     Status: Abnormal   Collection Time: 02/22/21 12:59 PM   Specimen: Nasopharyngeal Swab  Result Value Ref Range Status   SARS Coronavirus 2 POSITIVE (A) NEGATIVE Final    Comment: (NOTE) SARS-CoV-2 target nucleic acids are DETECTED.  The SARS-CoV-2 RNA is generally detectable in upper and lower respiratory specimens during the acute phase of infection. Positive results are indicative of the presence of SARS-CoV-2 RNA. Clinical correlation with patient history and other diagnostic information is  necessary to determine patient infection status. Positive results do not rule out bacterial infection or co-infection with other viruses.  The expected result is Negative.  Fact Sheet for Patients: SugarRoll.be  Fact Sheet for Healthcare Providers: https://www.woods-mathews.com/  This test is not yet approved or cleared by the [place] FDA and  has been authorized for detection and/or diagnosis of SARS-CoV-2 by FDA under an Emergency Use  Authorization (EUA). This EUA will remain  in effect (meaning this test can be used) for the duration of the COVID-19 declaration under Section 564(b)(1) of the Act, 21 U. S.C. section 360bbb-3(b)(1), unless the authorization is terminated or revoked sooner.   Performed at [facility], [place] [address] [place], [place] [address]     Radiology Studies: No results found.   Bretta Bang  Bettina Gavia [facility]  If 7PM-7AM, please contact night-coverage [email or web address] 02/28/2021, 3:26 PM

## 2021-02-28 NOTE — Plan of Care (Signed)

## 2021-02-28 NOTE — Care Management Important Message (Signed)
Important Message  Patient Details  Name: Dorothy Barker MRN: [id number] Date of Birth: [birth year]   Medicare Important Message Given:  Yes     Shelda Altes 02/28/2021, 10:32 AM

## 2021-02-28 NOTE — Progress Notes (Addendum)
Manufacturing engineer [facility]) Hospital Liaison Note:  Update on this [place] Referral:  Unfortunately [facility] is not able to offer a room today. TOC  aware Manufacturing engineer liaison will follow up with TOC and family tomorrow or sooner if room becomes available.   Please do not hesitate to call with questions.  Thank you.   Gar Ponto, RN [facility] Liaison  [place] are on [place] at [phone]  Patient is eligible for residential hospice per [facility] MD.

## 2021-02-28 NOTE — TOC Progression Note (Signed)
Transition of Care [facility]) - Progression Note    Patient Details  Name: Dorothy Barker MRN: [id number] Date of Birth: 02-28-1941  Transition of Care [facility]) CM/SW [place],  Phone Number: 02/28/2021, 12:12 PM  Clinical Narrative:   CSW alerted by Judeen Hammans with [facility] that there are no beds available this morning at [facility], but will update CSW with any changes. CSW updated daughter at bedside. CSW to follow.    Expected Discharge Plan: [place] Barriers to Discharge: Continued Medical Work up, Ship broker  Expected Discharge Plan and Services Expected Discharge Plan: [place] Choice: [place] arrangements for the past 2 months: Assisted Living Facility                                       Social Determinants of Health (SDOH) Interventions    Readmission Risk Interventions No flowsheet data found.

## 2021-03-01 ENCOUNTER — Ambulatory Visit (HOSPITAL_COMMUNITY): Admission: RE | Admit: 2021-03-01 | Payer: Medicare PPO | Source: Ambulatory Visit

## 2021-03-01 ENCOUNTER — Ambulatory Visit: Payer: Medicare PPO | Admitting: Neurology

## 2021-03-01 DIAGNOSIS — N1832 Chronic kidney disease, stage 3b: Secondary | ICD-10-CM | POA: Diagnosis not present

## 2021-03-01 DIAGNOSIS — I4892 Unspecified atrial flutter: Secondary | ICD-10-CM | POA: Diagnosis not present

## 2021-03-01 DIAGNOSIS — Z515 Encounter for palliative care: Secondary | ICD-10-CM | POA: Diagnosis not present

## 2021-03-01 DIAGNOSIS — E1122 Type 2 diabetes mellitus with diabetic chronic kidney disease: Secondary | ICD-10-CM | POA: Diagnosis not present

## 2021-03-01 MED ORDER — HYDROMORPHONE HCL 1 MG/ML IJ SOLN: 0.5000 mg | INTRAMUSCULAR | 0 refills | Status: AC | PRN

## 2021-03-01 MED ORDER — ACETAMINOPHEN 650 MG RE SUPP: 650.0000 mg | Freq: Four times a day (QID) | RECTAL | 0 refills | Status: AC | PRN

## 2021-03-01 MED ORDER — GLYCOPYRROLATE 0.2 MG/ML IJ SOLN: 0.2000 mg | INTRAMUSCULAR | Status: AC | PRN

## 2021-03-01 MED ORDER — ONDANSETRON HCL 4 MG/2ML IJ SOLN: 4.0000 mg | Freq: Four times a day (QID) | INTRAMUSCULAR | 0 refills | Status: AC | PRN

## 2021-03-01 MED ORDER — LORAZEPAM 2 MG/ML IJ SOLN: 1.0000 mg | INTRAMUSCULAR | 0 refills | Status: AC | PRN

## 2021-03-01 MED ORDER — WHITE PETROLATUM EX OINT
TOPICAL_OINTMENT | CUTANEOUS | Status: AC
  Filled 2021-03-01: qty 28.35

## 2021-03-01 NOTE — Discharge Summary (Signed)
Physician Discharge Summary  AMIRI TRITCH HQI:[id number] DOB: 1941-04-25 DOA: 02/21/2021  PCP: Earmon Phoenix, NP  Admit date: 02/21/2021 Discharge date: 03/01/2021  Admitted From: ALF Disposition: Residential hospice (B complex)  Discharge Condition: Stable for transfer CODE STATUS: DNR/DNI   Hospital Course: 80 year old F with PMH of HTN, IDDM-2, a flutter on Pradaxa, PAD, multiple recent CVAs, cognitive impairment, HLD, pressure skin injury of bilateral heels and ambulatory dysfunction brought to ED with expressive aphasia and found to have acute left posterior insular stroke with scattered punctate foci of acute ischemia in posterior left MCA territory, near the parietal temporal junction and old right BG infarct.  CTA head and neck without emergent LVO but mild progression of severe stenosis of the left MCA mid M1 segment and proximal M2 segment, unchanged moderate to severe stenosis of the distal right M1 segment and unchanged severe stenosis of the left PCA P2 segment.  TTE and carotid US without significant finding.  Per neurology, concern about Pradaxa failure, and recommended adding low-dose aspirin.  Given grim long-term prognosis, palliative medicine consulted. Patient has persistent encephalopathy.  Repeat CT head showed extension of previously seen left MCA territory infarction, now involving the entire distribution of the inferior division of left MCA with mild swelling but no hemorrhage.   After meeting with palliative care, CODE STATUS changed to DNR/DNI on 7/9.  Eventually, patient was transitioned to full comfort care on 7/10, and transferred to residential hospice at [facility] on 7/12.   Discharge Diagnoses:  End-of-life care/comfort measures only -Continue comfort care.  No apparent distress.  -Continue emotional support for family -Transfer to [facility]   Acute Ischemic Stroke-presented with expressive aphasia.  Repeat CTA head showed extensive progression of his CVA.   She has extensive diffuse intracranial stenosis.  High risk for recurrent CVA.   Acute Metabolic Encephalopathy in patient with cognitive impairment-likely due to CVA with underlying cognitive impairment.   Dysphagia-likely due to #1.   COVID positive-was positive on 6/19 with mild/asymptomatic disease.   CKD-3B/azotemia: Relatively stable.   Anemia of chronic disease: H&H relatively stable.   Microscopic hematuria-no UTI.  Urine culture negative.   Essential hypertension   Hypothyroidism   Atrial Flutter: Rate controlled.   Hypomagnesemia:    Uncontrolled IDDM-2 with hyperglycemia: A1c 8.4%.     Debility/ambulatory dysfunction   Goal of care counseling: Appreciate help by palliative care.   Class I obesity  Body mass index is 33.97 kg/m.        Pressure skin injury: POA Pressure Injury 02/07/21 Heel Left Unstageable - Full thickness tissue loss in which the base of the injury is covered by slough (yellow, tan, gray, green or brown) and/or eschar (tan, brown or black) in the wound bed. (Active)  02/07/21 1222  Location: Heel  Location Orientation: Left  Staging: Unstageable - Full thickness tissue loss in which the base of the injury is covered by slough (yellow, tan, gray, green or brown) and/or eschar (tan, brown or black) in the wound bed.  Wound Description (Comments):   Present on Admission: Yes    Discharge Exam: Vitals:   02/28/21 0859 03/01/21 0738  BP:  (!) 165/66  Pulse: 97 94  Resp:  (!) 21  Temp: 98.2 F (36.8 C) 99.4 F (37.4 C)  SpO2: 94% 94%   GENERAL: Appears comfortable. RESP: No increased work of breathing. MSK/EXT: No apparent deformity. NEURO: Asleep. PSYCH: Calm.  No distress or agitation. Discharge Instructions   Allergies as of 03/01/2021  Reactions   Tramadol Other (See Comments)   Syncopal episodes    Amlodipine Besy-benazepril Hcl Rash   Codeine Rash   Lotensin [benazepril] Rash   Naproxen Rash         Medication List     STOP taking these medications    acetaminophen 500 MG tablet Commonly known as: TYLENOL Replaced by: acetaminophen 650 MG suppository   atorvastatin 40 MG tablet Commonly known as: LIPITOR   dabigatran 150 MG Caps capsule Commonly known as: PRADAXA   hydrALAZINE 10 MG tablet Commonly known as: APRESOLINE   hydrochlorothiazide 25 MG tablet Commonly known as: HYDRODIURIL   insulin glargine 100 unit/mL Sopn Commonly known as: LANTUS   lisinopril 30 MG tablet Commonly known as: ZESTRIL   metoprolol tartrate 50 MG tablet Commonly known as: LOPRESSOR   pantoprazole 40 MG tablet Commonly known as: PROTONIX   thyroid 60 MG tablet Commonly known as: ARMOUR   Vitamin D (Ergocalciferol) 1.25 MG (50000 UNIT) Caps capsule Commonly known as: DRISDOL       TAKE these medications    acetaminophen 650 MG suppository Commonly known as: TYLENOL Place 1 suppository (650 mg total) rectally every 6 (six) hours as needed for mild pain (or Fever >/= 101). Replaces: acetaminophen 500 MG tablet   glycopyrrolate 0.2 MG/ML injection Commonly known as: ROBINUL Inject 1 mL (0.2 mg total) into the skin every 4 (four) hours as needed (excessive secretions).   HYDROmorphone 1 MG/ML injection Commonly known as: DILAUDID Inject 0.5 mLs (0.5 mg total) into the vein every 2 (two) hours as needed for severe pain (SOB, discomfort).   LORazepam 2 MG/ML injection Commonly known as: ATIVAN Inject 0.5 mLs (1 mg total) into the vein every 4 (four) hours as needed for anxiety.   ondansetron 4 MG/2ML Soln injection Commonly known as: ZOFRAN Inject 2 mLs (4 mg total) into the vein every 6 (six) hours as needed for nausea.        Consultations: Neurology Palliative medicine  Procedures/Studies:   CT ANGIO HEAD NECK W WO CM  Result Date: 02/07/2021 CLINICAL DATA:  Neuro deficit, acute stroke suspected. EXAM: CT ANGIOGRAPHY HEAD AND NECK TECHNIQUE: Multidetector CT  imaging of the head and neck was performed using the standard protocol during bolus administration of intravenous contrast. Multiplanar CT image reconstructions and MIPs were obtained to evaluate the vascular anatomy. Carotid stenosis measurements (when applicable) are obtained utilizing NASCET criteria, using the distal internal carotid diameter as the denominator. CONTRAST:  49mL OMNIPAQUE IOHEXOL 350 MG/ML SOLN COMPARISON:  Same day MRI.  CT head from yesterday. FINDINGS: CT HEAD FINDINGS Brain: No significant change in edema associated with inferior left cerebellar acute infarct, better characterized on same day MRI. No evidence of new/interval large vascular territory infarct. No mass occupying acute hemorrhage. Remote right basal ganglia infarct. Similar atrophy and chronic microvascular ischemic disease. No midline shift. Basal cisterns are patent. No hydrocephalus. Vascular: See below. Skull: No acute fracture. Sinuses: Left maxillary sinus and left sphenoid sinus retention cyst. Ethmoid air cell and right maxillary sinus mucosal thickening. Orbits: No acute abnormality. Review of the MIP images confirms the above findings CTA NECK FINDINGS Aortic arch: The brachiocephalic, left subclavian, and left common carotid artery origins are not imaged. Visualized great vessels are patent. Right carotid system: No evidence of dissection, stenosis (50% or greater) or occlusion. Left carotid system: Common carotid origin is not imaged. Makes calcific and noncalcific atherosclerosis at the carotid bifurcation without greater than 50% stenosis. Vertebral arteries:  Mildly left dominant. No evidence of dissection, stenosis (50% or greater) or occlusion. Skeleton: Multilevel degenerative change of the cervical spine with bulky anterior osteophytes and facet mediated mild anterolisthesis of C4 on C5. Other neck: No acute abnormality. Upper chest: Visualized lung apices are clear. Review of the MIP images confirms the above  findings CTA HEAD FINDINGS Anterior circulation: Bilateral cavernous/paraclinoid calcific atherosclerosis without high-grade stenosis of the intracranial ICAs. Moderate to severe stenosis of the mid to distal right M1 MCA and the mid left M1 MCA. Multifocal atherosclerotic narrowing of bilateral M2 MCAs. Bilateral ACAs are patent. Posterior circulation: Severe stenosis versus occlusion of thea left AICA (series 12, images 124/125) with distal opacification. Right PICA is patent. Left PICA is not visualized. Bilateral intradural vertebral arteries are patent without greater than 50% stenosis. The basilar artery and bilateral posterior cerebral arteries are patent. Multifocal severe stenosis of the left P2 PCA. Severe distal right P1 PCA stenosis. Venous sinuses: As permitted by contrast timing, patent. Review of the MIP images confirms the above findings IMPRESSION: CT head: No significant change in edema associated with inferior left cerebellar acute infarct, better characterized on same day MRI. CTA Head: 1. Severe stenosis versus occlusion of the proximal left AICA with distal opacification. A left PICA is not visualized which could represent AICA dominance vs proximal PICA stenosis/occlusion. 2. Severe distal right P1 PCA and severe multifocal left P2 PCA stenosis. 3. Moderate to severe stenosis of the bilateral M1 MCAs. CTA Neck: 1. Left greater than right carotid bifurcation atherosclerosis without greater than 50% stenosis. 2. Great vessel origins are not imaged. These results will be called to the ordering clinician or representative by the Radiologist Assistant, and communication documented in the PACS or [employer]. Electronically Signed   By: Margaretha Sheffield MD   On: 02/07/2021 10:43   CT HEAD WO CONTRAST  Result Date: 02/24/2021 CLINICAL DATA:  Follow-up left brain stroke. EXAM: CT HEAD WITHOUT CONTRAST TECHNIQUE: Contiguous axial images were obtained from the base of the skull through the  vertex without intravenous contrast. COMPARISON:  MRI and CT studies 02/21/2021. FINDINGS: Brain: Brainstem and cerebellum remain unremarkable. Right cerebral hemisphere shows old infarction in the basal ganglia and radiating white matter tracts in small-vessel changes of the white matter. On the left, there is been considerable progression of infarction within the left MCA territory, apparently affecting the entire inferior division which shows confluent low-density and swelling. No evidence of hemorrhage there is only mild mass effect with left-to-right shift of 1 mm. No hydrocephalus. No extra-axial collection. Vascular: There is atherosclerotic calcification of the major vessels at the base of the brain. Skull: Negative Sinuses/Orbits: Clear except for a retention cyst in the left maxillary sinus. Orbits negative. Other: None IMPRESSION: Extension of the previously seen left MCA territory infarction, now involving the entire distribution of the inferior division of the left MCA. Low-density throughout that region with mild swelling but no hemorrhage. Electronically Signed   By: Nelson Chimes M.D.   On: 02/24/2021 16:45   CT Head Wo Contrast  Result Date: 02/06/2021 CLINICAL DATA:  Fall EXAM: CT HEAD WITHOUT CONTRAST CT CERVICAL SPINE WITHOUT CONTRAST TECHNIQUE: Multidetector CT imaging of the head and cervical spine was performed following the standard protocol without intravenous contrast. Multiplanar CT image reconstructions of the cervical spine were also generated. COMPARISON:  None. FINDINGS: CT HEAD FINDINGS Brain: Area of edema in the left cerebellum measures approximately 4.0 x 3.5 cm. There is an old infarct of the  right basal ganglia. There is periventricular hypoattenuation compatible with chronic microvascular disease. There is generalized atrophy without lobar predilection. No acute hemorrhage Vascular: No abnormal hyperdensity of the major intracranial arteries or dural venous sinuses. No  intracranial atherosclerosis. Skull: The visualized skull base, calvarium and extracranial soft tissues are normal. Sinuses/Orbits: No fluid levels or advanced mucosal thickening of the visualized paranasal sinuses. No mastoid or middle ear effusion. The orbits are normal. CT CERVICAL SPINE FINDINGS Alignment: No static subluxation. Facets are aligned. Occipital condyles are normally positioned. Skull base and vertebrae: No acute fracture. Soft tissues and spinal canal: No prevertebral fluid or swelling. No visible canal hematoma. Disc levels: Large central disc protrusion at C4-5 indents the ventral spinal cord. Small central disc protrusions also at C2-3 and C3-4 narrows the ventral thecal sac. Upper chest: No pneumothorax, pulmonary nodule or pleural effusion. Other: Normal visualized paraspinal cervical soft tissues. IMPRESSION: 1. Area of edema in the left cerebellum without associated mass effect or acute hemorrhage, most likely a subacute infarct. MRI of the brain with and without contrast is recommended for further characterization and to exclude neoplasm. 2. No acute fracture or static subluxation of the cervical spine. 3. 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 Electronically Signed   By: Ulyses Jarred M.D.   On: 02/06/2021 20:01   CT Cervical Spine Wo Contrast  Result Date: 02/06/2021 CLINICAL DATA:  Fall EXAM: CT HEAD WITHOUT CONTRAST CT CERVICAL SPINE WITHOUT CONTRAST TECHNIQUE: Multidetector CT imaging of the head and cervical spine was performed following the standard protocol without intravenous contrast. Multiplanar CT image reconstructions of the cervical spine were also generated. COMPARISON:  None. FINDINGS: CT HEAD FINDINGS Brain: Area of edema in the left cerebellum measures approximately 4.0 x 3.5 cm. There is an old infarct of the right basal ganglia. There is periventricular hypoattenuation compatible with chronic microvascular disease. There is generalized  atrophy without lobar predilection. No acute hemorrhage Vascular: No abnormal hyperdensity of the major intracranial arteries or dural venous sinuses. No intracranial atherosclerosis. Skull: The visualized skull base, calvarium and extracranial soft tissues are normal. Sinuses/Orbits: No fluid levels or advanced mucosal thickening of the visualized paranasal sinuses. No mastoid or middle ear effusion. The orbits are normal. CT CERVICAL SPINE FINDINGS Alignment: No static subluxation. Facets are aligned. Occipital condyles are normally positioned. Skull base and vertebrae: No acute fracture. Soft tissues and spinal canal: No prevertebral fluid or swelling. No visible canal hematoma. Disc levels: Large central disc protrusion at C4-5 indents the ventral spinal cord. Small central disc protrusions also at C2-3 and C3-4 narrows the ventral thecal sac. Upper chest: No pneumothorax, pulmonary nodule or pleural effusion. Other: Normal visualized paraspinal cervical soft tissues. IMPRESSION: 1. Area of edema in the left cerebellum without associated mass effect or acute hemorrhage, most likely a subacute infarct. MRI of the brain with and without contrast is recommended for further characterization and to exclude neoplasm. 2. No acute fracture or static subluxation of the cervical spine. 3. 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 Electronically Signed   By: Ulyses Jarred M.D.   On: 02/06/2021 20:01   MR BRAIN WO CONTRAST  Result Date: 02/21/2021 CLINICAL DATA:  Encephalopathy and aphasia EXAM: MRI HEAD WITHOUT CONTRAST TECHNIQUE: Multiplanar, multiecho pulse sequences of the brain and surrounding structures were obtained without intravenous contrast. COMPARISON:  02/07/2021 FINDINGS: Brain: Small acute infarct of the posterior left insula. There are a few scattered punctate foci of acute  ischemia in the posterior left MCA territory, near the parietal temporal junction. Hemosiderin  deposition at the site of old right basal ganglia infarct. No acute hemorrhage. There is multifocal hyperintense T2-weighted signal within the white matter. Generalized volume loss without a clear lobar predilection. The midline structures are normal. Vascular: Major flow voids are preserved. Skull and upper cervical spine: Normal calvarium and skull base. Visualized upper cervical spine and soft tissues are normal. Sinuses/Orbits:No paranasal sinus fluid levels or advanced mucosal thickening. No mastoid or middle ear effusion. Left maxillary retention cyst normal orbits. IMPRESSION: 1. Small acute infarct of the posterior left insula. No hemorrhage or mass effect. 2. A few scattered punctate foci of acute ischemia in the posterior left MCA territory, near the parietal temporal junction. 3. Old right basal ganglia infarct and findings of chronic ischemic microangiopathy. Electronically Signed   By: Ulyses Jarred M.D.   On: 02/21/2021 23:11   MR BRAIN WO CONTRAST  Result Date: 02/07/2021 CLINICAL DATA:  Stroke follow-up EXAM: MRI HEAD WITHOUT CONTRAST TECHNIQUE: Multiplanar, multiecho pulse sequences of the brain and surrounding structures were obtained without intravenous contrast. COMPARISON:  01/04/2021 FINDINGS: Brain: Large acute infarct of the left cerebellum. Old right basal ganglia infarct. No acute or chronic hemorrhage. There is multifocal hyperintense T2-weighted signal within the white matter. Generalized volume loss without a clear lobar predilection. The midline structures are normal. Vascular: Major flow voids are preserved. Skull and upper cervical spine: Normal calvarium and skull base. Visualized upper cervical spine and soft tissues are normal. Sinuses/Orbits:Left maxillary retention cyst no mastoid or middle ear effusion. Normal orbits. IMPRESSION: 1. Large acute left cerebellar infarct. No hemorrhage or mass effect. 2. Old right basal ganglia infarct and findings of chronic microvascular  ischemia. Electronically Signed   By: Ulyses Jarred M.D.   On: 02/07/2021 02:18   DG Pelvis Portable  Result Date: 02/06/2021 CLINICAL DATA:  Ground level fall EXAM: PORTABLE PELVIS 1-2 VIEWS COMPARISON:  None. FINDINGS: There is no evidence of pelvic fracture or diastasis. Degenerative changes of the bilateral hips, right worse than left. No pelvic bone lesions are seen. Advanced vascular calcifications are present. IMPRESSION: Negative. Electronically Signed   By: Davina Poke D.O.   On: 02/06/2021 20:29   CARDIAC EVENT MONITOR  Result Date: 02/10/2021 1. NSR with sinus brady and sinus tachycardia 2. Periods of AVWB are present 3. Paroxysmal atrial flutter with a controled VR is present 4. No prolonged pauses 4. Noise artifact is present 5. No clear cut atrial fib 6. No VT or SVT Salome Spotted  DG Chest Port 1 View  Result Date: 02/06/2021 CLINICAL DATA:  Ground level fall EXAM: PORTABLE CHEST 1 VIEW COMPARISON:  None. FINDINGS: Heart size within normal limits. Low lung volumes. Blunting of the left costophrenic angle may reflect small pleural effusion versus prominent epicardial fat pad. 11 mm nodular density projects within the left upper lobe. Right lung is clear. No pneumothorax. No acute osseous abnormality. IMPRESSION: 1. Blunting of the left costophrenic angle may reflect small pleural effusion versus prominent epicardial fat pad. 2. 11 mm nodular density projects within the left upper lobe. Follow-up chest CT is recommended for further evaluation if recent outside imaging of the chest is unavailable. Electronically Signed   By: Davina Poke D.O.   On: 02/06/2021 20:26   ECHOCARDIOGRAM COMPLETE  Result Date: 02/23/2021    ECHOCARDIOGRAM REPORT   Patient Name:   Dorothy Barker Date of Exam: 02/23/2021 Medical Rec #:  [id number]  Height:       60.0 in Accession #:    [id number]  Weight:       173.9 lb Date of Birth:  12-16-1940  BSA:          1.759 m Patient Age:    70 years    BP:            165/72 mmHg Patient Gender: F           HR:           98 bpm. Exam Location:  Inpatient Procedure: 2D Echo, Cardiac Doppler and Color Doppler Indications:    Stroke  History:        Patient has prior history of Echocardiogram examinations, most                 recent 02/07/2021. Risk Factors:Hypertension, Diabetes and                 Dyslipidemia.  Sonographer:    Cammy Brochure Referring Phys: [id number] [place]  1. Left ventricular ejection fraction, by estimation, is 60 to 65%. The left ventricle has normal function. The left ventricle has no regional wall motion abnormalities. There is mild concentric left ventricular hypertrophy. Left ventricular diastolic parameters are consistent with Grade I diastolic dysfunction (impaired relaxation).  2. Right ventricular systolic function is normal. The right ventricular size is normal. There is normal pulmonary artery systolic pressure.  3. Left atrial size was mildly dilated.  4. Right atrial size was mildly dilated.  5. The mitral valve is abnormal. Mild mitral valve regurgitation. No evidence of mitral stenosis.  6. The aortic valve is tricuspid. There is mild calcification of the aortic valve. Aortic valve regurgitation is trivial. Mild aortic valve sclerosis is present, with no evidence of aortic valve stenosis.  7. The inferior vena cava is normal in size with greater than 50% respiratory variability, suggesting right atrial pressure of 3 mmHg. Comparison(s): No significant change from prior study. Conclusion(s)/Recommendation(s): No intracardiac source of embolism detected on this transthoracic study. A transesophageal echocardiogram is recommended to exclude cardiac source of embolism if clinically indicated. FINDINGS  Left Ventricle: Left ventricular ejection fraction, by estimation, is 60 to 65%. The left ventricle has normal function. The left ventricle has no regional wall motion abnormalities. The left ventricular internal cavity size  was normal in size. There is  mild concentric left ventricular hypertrophy. Left ventricular diastolic parameters are consistent with Grade I diastolic dysfunction (impaired relaxation). Right Ventricle: The right ventricular size is normal. Right vetricular wall thickness was not well visualized. Right ventricular systolic function is normal. There is normal pulmonary artery systolic pressure. The tricuspid regurgitant velocity is 2.58 m/s, and with an assumed right atrial pressure of 3 mmHg, the estimated right ventricular systolic pressure is 98.9 mmHg. Left Atrium: Left atrial size was mildly dilated. Right Atrium: Right atrial size was mildly dilated. Pericardium: There is no evidence of pericardial effusion. Presence of pericardial fat pad. Mitral Valve: The mitral valve is abnormal. There is moderate calcification of the mitral valve leaflet(s). Mild mitral valve regurgitation. No evidence of mitral valve stenosis. Tricuspid Valve: The tricuspid valve is normal in structure. Tricuspid valve regurgitation is mild. Aortic Valve: The aortic valve is tricuspid. There is mild calcification of the aortic valve. Aortic valve regurgitation is trivial. Mild aortic valve sclerosis is present, with no evidence of aortic valve stenosis. Aortic valve mean gradient measures 3.0 mmHg. Aortic valve peak gradient measures 5.5  mmHg. Aortic valve area, by VTI measures 2.50 cm. Pulmonic Valve: The pulmonic valve was grossly normal. Pulmonic valve regurgitation is trivial. No evidence of pulmonic stenosis. Aorta: The aortic root, ascending aorta and aortic arch are all structurally normal, with no evidence of dilitation or obstruction. Venous: The inferior vena cava is normal in size with greater than 50% respiratory variability, suggesting right atrial pressure of 3 mmHg. IAS/Shunts: The interatrial septum appears to be lipomatous. The atrial septum is grossly normal.  LEFT VENTRICLE PLAX 2D LVIDd:         3.70 cm  Diastology  LVIDs:         2.50 cm  LV e' medial:    3.70 cm/s LV PW:         1.10 cm  LV E/e' medial:  13.0 LV IVS:        1.50 cm  LV e' lateral:   5.44 cm/s LVOT diam:     1.80 cm  LV E/e' lateral: 8.9 LV SV:         56 LV SV Index:   32 LVOT Area:     2.54 cm  RIGHT VENTRICLE            IVC RV Basal diam:  4.20 cm    IVC diam: 0.70 cm RV S prime:     9.90 cm/s TAPSE (M-mode): 1.7 cm LEFT ATRIUM             Index       RIGHT ATRIUM           Index LA diam:        4.00 cm 2.27 cm/m  RA Area:     14.80 cm LA Vol (A2C):   50.1 ml 28.48 ml/m RA Volume:   35.80 ml  20.35 ml/m LA Vol (A4C):   45.5 ml 25.87 ml/m LA Biplane Vol: 48.0 ml 27.29 ml/m  AORTIC VALVE AV Area (Vmax):    2.39 cm AV Area (Vmean):   2.35 cm AV Area (VTI):     2.50 cm AV Vmax:           117.33 cm/s AV Vmean:          80.433 cm/s AV VTI:            0.223 m AV Peak Grad:      5.5 mmHg AV Mean Grad:      3.0 mmHg LVOT Vmax:         110.00 cm/s LVOT Vmean:        74.400 cm/s LVOT VTI:          0.219 m LVOT/AV VTI ratio: 0.98  AORTA Ao Root diam: 3.40 cm Ao Asc diam:  3.70 cm MITRAL VALVE               TRICUSPID VALVE MV Area (PHT): 3.17 cm    TR Peak grad:   26.6 mmHg MV Decel Time: 239 msec    TR Vmax:        258.00 cm/s MV E velocity: 48.20 cm/s MV A velocity: 99.00 cm/s  SHUNTS MV E/A ratio:  0.49        Systemic VTI:  0.22 m                            Systemic Diam: 1.80 cm Buford Dresser MD Electronically signed by Buford Dresser MD Signature Date/Time: 02/23/2021/4:43:54 PM    Final  ECHOCARDIOGRAM COMPLETE  Result Date: 02/07/2021    ECHOCARDIOGRAM REPORT   Patient Name:   ROSALIND GUIDO Date of Exam: 02/07/2021 Medical Rec #:  [id number]   Height: Accession #:    [id number]  Weight: Date of Birth:  [birth year]  BSA: Patient Age:    52 years    BP:           143/62 mmHg Patient Gender: F           HR:           80 bpm. Exam Location:  Inpatient Procedure: 2D Echo, Cardiac Doppler and Color Doppler Indications:    CVA  History:         Patient has no prior history of Echocardiogram examinations.                 Stroke and PVD, Arrythmias:Atrial Flutter,                 Signs/Symptoms:Syncope and Pleural effusion; Risk                 Factors:Hypertension, Dyslipidemia, Diabetes and COVID+.  Sonographer:    Dustin Flock Referring Phys: [id number] Vernelle Emerald  Sonographer Comments: Patient is morbidly obese. Image acquisition challenging due to patient body habitus. IMPRESSIONS  1. Left ventricular ejection fraction, by estimation, is 60 to 65%. The left ventricle has normal function. The left ventricle has no regional wall motion abnormalities. Left ventricular diastolic parameters are consistent with Grade I diastolic dysfunction (impaired relaxation).  2. Right ventricular systolic function is hyperdynamic. The right ventricular size is normal. There is normal pulmonary artery systolic pressure.  3. The mitral valve is abnormal. Trivial mitral valve regurgitation.  4. The aortic valve is tricuspid. Aortic valve regurgitation is trivial. Mild aortic valve sclerosis is present, with no evidence of aortic valve stenosis.  5. The inferior vena cava is normal in size with greater than 50% respiratory variability, suggesting right atrial pressure of 3 mmHg.  6. Echo free space mixed echogenic material overlying the RV apex, likely a fat pad or less likely a focal loculated pericardial effusion. Comparison(s): No prior Echocardiogram. FINDINGS  Left Ventricle: Left ventricular ejection fraction, by estimation, is 60 to 65%. The left ventricle has normal function. The left ventricle has no regional wall motion abnormalities. The left ventricular internal cavity size was normal in size. There is  no left ventricular hypertrophy. Left ventricular diastolic parameters are consistent with Grade I diastolic dysfunction (impaired relaxation). Indeterminate filling pressures. Right Ventricle: The right ventricular size is normal. No increase in right  ventricular wall thickness. Right ventricular systolic function is hyperdynamic. There is normal pulmonary artery systolic pressure. The tricuspid regurgitant velocity is 2.84 m/s, and with an assumed right atrial pressure of 3 mmHg, the estimated right ventricular systolic pressure is 75.1 mmHg. Left Atrium: Left atrial size was normal in size. Right Atrium: Right atrial size was normal in size. Pericardium: There is no evidence of pericardial effusion. Presence of pericardial fat pad. Mitral Valve: The mitral valve is abnormal. There is mild calcification of the anterior and posterior mitral valve leaflet(s). Trivial mitral valve regurgitation. Tricuspid Valve: The tricuspid valve is grossly normal. Tricuspid valve regurgitation is mild. Aortic Valve: The aortic valve is tricuspid. Aortic valve regurgitation is trivial. Mild aortic valve sclerosis is present, with no evidence of aortic valve stenosis. Pulmonic Valve: The pulmonic valve was grossly normal. Pulmonic valve regurgitation is trivial. Aorta: The aortic root and ascending aorta  are structurally normal, with no evidence of dilitation. Venous: The inferior vena cava is normal in size with greater than 50% respiratory variability, suggesting right atrial pressure of 3 mmHg. IAS/Shunts: No atrial level shunt detected by color flow Doppler. Additional Comments: There is pleural effusion in the left lateral region.  LEFT VENTRICLE PLAX 2D LVIDd:         3.60 cm  Diastology LVIDs:         2.50 cm  LV e' medial:    4.13 cm/s LV PW:         1.00 cm  LV E/e' medial:  12.0 LV IVS:        0.90 cm  LV e' lateral:   4.03 cm/s LVOT diam:     2.00 cm  LV E/e' lateral: 12.3 LV SV:         55 LVOT Area:     3.14 cm  RIGHT VENTRICLE RV Basal diam:  4.00 cm RV S prime:     7.07 cm/s TAPSE (M-mode): 2.3 cm LEFT ATRIUM             RIGHT ATRIUM LA diam:        2.80 cm RA Area:     10.50 cm LA Vol (A2C):   38.4 ml RA Volume:   24.70 ml LA Vol (A4C):   47.4 ml LA Biplane Vol:  45.0 ml  AORTIC VALVE LVOT Vmax:   95.00 cm/s LVOT Vmean:  66.400 cm/s LVOT VTI:    0.175 m  AORTA Ao Root diam: 2.70 cm MITRAL VALVE               TRICUSPID VALVE MV Area (PHT): 5.13 cm    TR Peak grad:   32.3 mmHg MV Decel Time: 148 msec    TR Vmax:        284.00 cm/s MV E velocity: 49.70 cm/s MV A velocity: 60.40 cm/s  SHUNTS MV E/A ratio:  0.82        Systemic VTI:  0.18 m                            Systemic Diam: 2.00 cm Lyman Bishop MD Electronically signed by Lyman Bishop MD Signature Date/Time: 02/07/2021/5:46:58 PM    Final    CT HEAD CODE STROKE WO CONTRAST  Result Date: 02/21/2021 CLINICAL DATA:  Code stroke.  Aphasia EXAM: CT HEAD WITHOUT CONTRAST TECHNIQUE: Contiguous axial images were obtained from the base of the skull through the vertex without intravenous contrast. COMPARISON:  02/07/2021 FINDINGS: Brain: There is no mass, hemorrhage or extra-axial collection. There is generalized atrophy without lobar predilection. There old infarcts of left cerebellum right basal. There is periventricular hypoattenuation compatible with chronic microvascular disease. No acute cortical infarct is evident. Vascular: Atherosclerotic calcification of the internal carotid arteries at the skull base. No abnormal hyperdensity of the major intracranial arteries or dural venous sinuses. Skull: The visualized skull base, calvarium and extracranial soft tissues are normal. Sinuses/Orbits: No fluid levels or advanced mucosal thickening of the visualized paranasal sinuses. No mastoid or middle ear effusion. Left maxillary retention cyst. The orbits are normal. ASPECTS (Campbell Stroke Program Early CT Score) - Ganglionic level infarction (caudate, lentiform nuclei, internal capsule, insula, M1-M3 cortex): 7 - Supraganglionic infarction (M4-M6 cortex): 3 Total score (0-10 with 10 being normal): 10 IMPRESSION: 1. No acute intracranial abnormality. 2. ASPECTS is 10. 3. 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 These results were communicated to Dr. Donnetta Simpers at 9:07 pm on 02/21/2021 by text page via the [facility] messaging system. Electronically Signed   By: Ulyses Jarred M.D.   On: 02/21/2021 21:07   VAS US CAROTID  Result Date: 03/01/2021 Carotid Arterial Duplex Study Patient Name:  KATALINA MAGRI  Date of Exam:   02/23/2021 Medical Rec #: [id number]    Accession #:    [id number] Date of Birth: Aug 27, 1940   Patient Gender: F Patient Age:   79Y Exam Location:  [facility] Procedure:      VAS US CAROTID Referring Phys: [id number] [place] --------------------------------------------------------------------------------  Indications:       CVA and Speech disturbance. Risk Factors:      Hypertension, hyperlipidemia, Diabetes, prior CVA. Comparison Study:  No prior study Performing Technologist: Sharion Dove RVS  Examination Guidelines: A complete evaluation includes B-mode imaging, spectral Doppler, color Doppler, and power Doppler as needed of all accessible portions of each vessel. Bilateral testing is considered an integral part of a complete examination. Limited examinations for reoccurring indications may be performed as noted.  Right Carotid Findings: +----------+--------+--------+--------+------------------+------------------+           PSV cm/sEDV cm/sStenosisPlaque DescriptionComments           +----------+--------+--------+--------+------------------+------------------+ CCA Prox  86      8                                 intimal thickening +----------+--------+--------+--------+------------------+------------------+ CCA Distal94      11                                intimal thickening +----------+--------+--------+--------+------------------+------------------+ ICA Prox  65      16              heterogenous                         +----------+--------+--------+--------+------------------+------------------+ ICA Distal84      25                                                    +----------+--------+--------+--------+------------------+------------------+ ECA       168     14                                                   +----------+--------+--------+--------+------------------+------------------+ +----------+--------+-------+--------+-------------------+           PSV cm/sEDV cmsDescribeArm Pressure (mmHG) +----------+--------+-------+--------+-------------------+ Subclavian160                                        +----------+--------+-------+--------+-------------------+ +---------+--------+--+--------+-+ VertebralPSV cm/s47EDV cm/s7 +---------+--------+--+--------+-+  Left Carotid Findings: +----------+--------+--------+--------+---------------------+------------------+           PSV cm/sEDV cm/sStenosisPlaque Description   Comments           +----------+--------+--------+--------+---------------------+------------------+ CCA Prox  99      17  intimal thickening +----------+--------+--------+--------+---------------------+------------------+ CCA Distal92      18                                   intimal thickening +----------+--------+--------+--------+---------------------+------------------+ ICA Prox  147     12              calcific and                                                              irregular                               +----------+--------+--------+--------+---------------------+------------------+ ICA Mid   121     19                                                      +----------+--------+--------+--------+---------------------+------------------+ ICA Distal78      22                                                      +----------+--------+--------+--------+---------------------+------------------+ ECA       225     9                                                        +----------+--------+--------+--------+---------------------+------------------+ +----------+--------+--------+--------+-------------------+           PSV cm/sEDV cm/sDescribeArm Pressure (mmHG) +----------+--------+--------+--------+-------------------+ Subclavian131                                         +----------+--------+--------+--------+-------------------+ +---------+--------+--+--------+--+ VertebralPSV cm/s69EDV cm/s16 +---------+--------+--+--------+--+   Summary: Right Carotid: Velocities in the right ICA are consistent with a 1-39% stenosis. Left Carotid: Velocities in the left ICA are consistent with a 1-39% stenosis. Vertebrals:  Bilateral vertebral arteries demonstrate antegrade flow. Subclavians: Normal flow hemodynamics were seen in bilateral subclavian              arteries. *See table(s) above for measurements and observations.  Electronically signed by Antony Contras MD on 03/01/2021 at 8:20:04 AM.    Final    CT ANGIO HEAD CODE STROKE  Result Date: 02/21/2021 CLINICAL DATA:  Aphasia EXAM: CT ANGIOGRAPHY HEAD AND NECK TECHNIQUE: Multidetector CT imaging of the head and neck was performed using the standard protocol during bolus administration of intravenous contrast. Multiplanar CT image reconstructions and MIPs were obtained to evaluate the vascular anatomy. Carotid stenosis measurements (when applicable) are obtained utilizing NASCET criteria, using the distal internal carotid diameter as the denominator. CONTRAST:  78mL OMNIPAQUE IOHEXOL 350 MG/ML SOLN COMPARISON:  Head CT 02/21/2021 CTA head neck 02/07/2021 FINDINGS:  CTA NECK FINDINGS SKELETON: There is no bony spinal canal stenosis. No lytic or blastic lesion. OTHER NECK: Normal pharynx, larynx and major salivary glands. No cervical lymphadenopathy. Unremarkable thyroid gland. UPPER CHEST: No pneumothorax or pleural effusion. No nodules or masses. AORTIC ARCH: There is calcific atherosclerosis of the aortic arch. There  is no aneurysm, dissection or hemodynamically significant stenosis of the visualized portion of the aorta. Conventional 3 vessel aortic branching pattern. The visualized proximal subclavian arteries are widely patent. RIGHT CAROTID SYSTEM: No dissection, occlusion or aneurysm. Mild atherosclerotic calcification at the carotid bifurcation without hemodynamically significant stenosis. LEFT CAROTID SYSTEM: No dissection, occlusion or aneurysm. Mild atherosclerotic calcification at the carotid bifurcation without hemodynamically significant stenosis. VERTEBRAL ARTERIES: Left dominant configuration. Both origins are clearly patent. There is no dissection, occlusion or flow-limiting stenosis to the skull base (V1-V3 segments). CTA HEAD FINDINGS POSTERIOR CIRCULATION: --Vertebral arteries: Normal V4 segments. --Inferior cerebellar arteries: Normal. --Basilar artery: Normal. --Superior cerebellar arteries: Normal. --Posterior cerebral arteries (PCA): Severe stenosis of the left PCA P2 segment, unchanged. ANTERIOR CIRCULATION: --Intracranial internal carotid arteries: Normal. --Anterior cerebral arteries (ACA): Normal. Both A1 segments are present. Patent anterior communicating artery (a-comm). --Middle cerebral arteries (MCA): There is severe stenosis of the left mid M1 segment and proximal left M2 segment, which is slightly progressed since 02/07/2021. There is moderate-to-severe stenosis of the distal right M1 segment, unchanged. No occlusion. VENOUS SINUSES: As permitted by contrast timing, patent. ANATOMIC VARIANTS: None Review of the MIP images confirms the above findings. IMPRESSION: 1. No emergent large vessel occlusion. 2. Mild progression of severe stenosis of the left MCA mid M1 segment and proximal M2 segment. 3. Unchanged moderate-to-severe stenosis of the distal right M1 segment. 4. Unchanged severe stenosis of the left PCA P2 segment. Aortic Atherosclerosis (ICD10-I70.0). Electronically Signed   By: Ulyses Jarred M.D.   On: 02/21/2021 21:21   CT ANGIO NECK CODE STROKE  Result Date: 02/21/2021 CLINICAL DATA:  Aphasia EXAM: CT ANGIOGRAPHY HEAD AND NECK TECHNIQUE: Multidetector CT imaging of the head and neck was performed using the standard protocol during bolus administration of intravenous contrast. Multiplanar CT image reconstructions and MIPs were obtained to evaluate the vascular anatomy. Carotid stenosis measurements (when applicable) are obtained utilizing NASCET criteria, using the distal internal carotid diameter as the denominator. CONTRAST:  85mL OMNIPAQUE IOHEXOL 350 MG/ML SOLN COMPARISON:  Head CT 02/21/2021 CTA head neck 02/07/2021 FINDINGS: CTA NECK FINDINGS SKELETON: There is no bony spinal canal stenosis. No lytic or blastic lesion. OTHER NECK: Normal pharynx, larynx and major salivary glands. No cervical lymphadenopathy. Unremarkable thyroid gland. UPPER CHEST: No pneumothorax or pleural effusion. No nodules or masses. AORTIC ARCH: There is calcific atherosclerosis of the aortic arch. There is no aneurysm, dissection or hemodynamically significant stenosis of the visualized portion of the aorta. Conventional 3 vessel aortic branching pattern. The visualized proximal subclavian arteries are widely patent. RIGHT CAROTID SYSTEM: No dissection, occlusion or aneurysm. Mild atherosclerotic calcification at the carotid bifurcation without hemodynamically significant stenosis. LEFT CAROTID SYSTEM: No dissection, occlusion or aneurysm. Mild atherosclerotic calcification at the carotid bifurcation without hemodynamically significant stenosis. VERTEBRAL ARTERIES: Left dominant configuration. Both origins are clearly patent. There is no dissection, occlusion or flow-limiting stenosis to the skull base (V1-V3 segments). CTA HEAD FINDINGS POSTERIOR CIRCULATION: --Vertebral arteries: Normal V4 segments. --Inferior cerebellar arteries: Normal. --Basilar artery: Normal. --Superior cerebellar arteries: Normal.  --Posterior cerebral arteries (PCA): Severe stenosis of the left PCA P2 segment, unchanged. ANTERIOR CIRCULATION: --Intracranial internal carotid arteries: Normal. --Anterior  cerebral arteries (ACA): Normal. Both A1 segments are present. Patent anterior communicating artery (a-comm). --Middle cerebral arteries (MCA): There is severe stenosis of the left mid M1 segment and proximal left M2 segment, which is slightly progressed since 02/07/2021. There is moderate-to-severe stenosis of the distal right M1 segment, unchanged. No occlusion. VENOUS SINUSES: As permitted by contrast timing, patent. ANATOMIC VARIANTS: None Review of the MIP images confirms the above findings. IMPRESSION: 1. No emergent large vessel occlusion. 2. Mild progression of severe stenosis of the left MCA mid M1 segment and proximal M2 segment. 3. Unchanged moderate-to-severe stenosis of the distal right M1 segment. 4. Unchanged severe stenosis of the left PCA P2 segment. Aortic Atherosclerosis (ICD10-I70.0). Electronically Signed   By: Ulyses Jarred M.D.   On: 02/21/2021 21:21       The results of significant diagnostics from this hospitalization (including imaging, microbiology, ancillary and laboratory) are listed below for reference.     Microbiology: Recent Results (from the past 240 hour(s))  Urine Culture     Status: None   Collection Time: 02/21/21  9:51 PM   Specimen: Urine, Random  Result Value Ref Range Status   Specimen Description URINE, RANDOM  Final   Special Requests NONE  Final   Culture   Final    NO GROWTH Performed at [facility], [address] [place], [place] [address]    Report Status 02/23/2021 FINAL  Final  SARS CORONAVIRUS 2 (TAT 6-24 HRS) Nasopharyngeal Nasopharyngeal Swab     Status: Abnormal   Collection Time: 02/22/21 12:59 PM   Specimen: Nasopharyngeal Swab  Result Value Ref Range Status   SARS Coronavirus 2 POSITIVE (A) NEGATIVE Final    Comment: (NOTE) SARS-CoV-2 target nucleic  acids are DETECTED.  The SARS-CoV-2 RNA is generally detectable in upper and lower respiratory specimens during the acute phase of infection. Positive results are indicative of the presence of SARS-CoV-2 RNA. Clinical correlation with patient history and other diagnostic information is  necessary to determine patient infection status. Positive results do not rule out bacterial infection or co-infection with other viruses.  The expected result is Negative.  Fact Sheet for Patients: SugarRoll.be  Fact Sheet for Healthcare Providers: https://www.woods-mathews.com/  This test is not yet approved or cleared by the [place] FDA and  has been authorized for detection and/or diagnosis of SARS-CoV-2 by FDA under an Emergency Use Authorization (EUA). This EUA will remain  in effect (meaning this test can be used) for the duration of the COVID-19 declaration under Section 564(b)(1) of the Act, 21 U. S.C. section 360bbb-3(b)(1), unless the authorization is terminated or revoked sooner.   Performed at [facility], [place] [address] [place], [place] [address]      Labs:  CBC: Recent Labs  Lab 02/23/21 1338 02/24/21 0116 02/25/21 0624 02/26/21 0057 02/27/21 0253  WBC 9.1 8.9 9.1 10.9* 8.5  NEUTROABS 5.7  --   --   --   --   HGB 11.1* 11.0* 10.7* 10.8* 10.9*  HCT 33.4* 33.0* 32.6* 32.9* 33.5*  MCV 88.6 88.9 90.1 91.1 89.3  PLT 230 213 189 175 189   BMP &GFR Recent Labs  Lab 02/23/21 1338 02/24/21 0116 02/25/21 0624 02/26/21 0057 02/27/21 0253  NA 137 137 138 137 138  K 3.4* 3.8 3.6 3.6 3.4*  CL 103 103 102 104 103  CO2 26 25 23 24 22   GLUCOSE 154* 118* 116* 130* 134*  BUN 20 24* 26* 25* 21  CREATININE  1.31* 1.52* 1.46* 1.27* 1.15*  CALCIUM 9.8 9.9 9.9 9.6 9.9  MG 1.6* 1.9 1.7 1.6* 1.8  PHOS 3.1 3.6 3.0 3.0 2.4*   Estimated Creatinine Clearance: 35.7 mL/min (A) (by C-G formula based on SCr of 1.15 mg/dL (H)). Liver &  Pancreas: Recent Labs  Lab 02/23/21 1338 02/24/21 0116 02/25/21 0624 02/26/21 0057 02/27/21 0253  AST 20  --   --   --   --   ALT 17  --   --   --   --   ALKPHOS 58  --   --   --   --   BILITOT 0.9  --   --   --   --   PROT 6.0*  --   --   --   --   ALBUMIN 2.9* 2.8* 2.9* 2.6* 2.6*   No results for input(s): LIPASE, AMYLASE in the last 168 hours. Recent Labs  Lab 02/25/21 0624  AMMONIA 16   Diabetic: No results for input(s): HGBA1C in the last 72 hours. Recent Labs  Lab 02/26/21 0613 02/26/21 1217 02/26/21 1602 02/26/21 2130 02/27/21 0630  GLUCAP 132* 150* 126* 102* 161*   Cardiac Enzymes: No results for input(s): CKTOTAL, CKMB, CKMBINDEX, TROPONINI in the last 168 hours. No results for input(s): PROBNP in the last 8760 hours. Coagulation Profile: No results for input(s): INR, PROTIME in the last 168 hours. Thyroid Function Tests: No results for input(s): TSH, T4TOTAL, FREET4, T3FREE, THYROIDAB in the last 72 hours. Lipid Profile: No results for input(s): CHOL, HDL, LDLCALC, TRIG, CHOLHDL, LDLDIRECT in the last 72 hours. Anemia Panel: No results for input(s): VITAMINB12, FOLATE, FERRITIN, TIBC, IRON, RETICCTPCT in the last 72 hours. Urine analysis:    Component Value Date/Time   COLORURINE STRAW (A) 02/21/2021 2151   APPEARANCEUR CLEAR 02/21/2021 2151   LABSPEC 1.011 02/21/2021 2151   PHURINE 6.0 02/21/2021 2151   GLUCOSEU >=500 (A) 02/21/2021 2151   HGBUR MODERATE (A) 02/21/2021 2151   BILIRUBINUR NEGATIVE 02/21/2021 2151   [place] NEGATIVE 02/21/2021 2151   PROTEINUR 100 (A) 02/21/2021 2151   NITRITE NEGATIVE 02/21/2021 2151   LEUKOCYTESUR NEGATIVE 02/21/2021 2151   Sepsis Labs: Invalid input(s): PROCALCITONIN, LACTICIDVEN   Time coordinating discharge: 35 minutes  SIGNED:  Mercy Riding, MD  [facility] 03/01/2021, 2:37 PM  If 7PM-7AM, please contact night-coverage [email or web address]

## 2021-03-01 NOTE — Progress Notes (Signed)
Report called to [facility]. Personal belongings taken by family, daughter,children

## 2021-03-01 NOTE — Progress Notes (Signed)
Nutrition Brief Note  Chart reviewed. Pt now transitioning to comfort care.  No further nutrition interventions planned at this time.  Please re-consult as needed.   Evella Kasal, MS, RD, LDN (she/her/hers) RD pager number and weekend/on-call pager number located in Amion.   

## 2021-03-01 NOTE — TOC Transition Note (Signed)
Transition of Care [facility]) - CM/SW Discharge Note   Patient Details  Name: Dorothy Barker MRN: [id number] Date of Birth: [birth year]  [facility] CM/SW Contact:  Geralynn Ochs, LCSW Phone Number: 03/01/2021, 3:25 PM   Clinical Narrative:   Nurse to call report to [phone].    Final next level of care: [place] Barriers to Discharge: Barriers Resolved   Patient Goals and CMS Choice Patient states their goals for this hospitalization and ongoing recovery are:: patient unable to participate in goal setting, only oriented to self CMS Medicare.gov Compare Post Acute Care list provided to:: Patient Represenative (must comment) Choice offered to / list presented to : Adult Children  Discharge Placement                Patient to be transferred to facility by: [place] Name of family member notified: Carla Patient and family notified of of transfer: 03/01/21  Discharge Plan and Services     Post Acute Care Choice: [place]                               Social Determinants of Health (SDOH) Interventions     Readmission Risk Interventions No flowsheet data found.

## 2021-03-01 NOTE — Progress Notes (Signed)
Engineer, maintenance [facility]) Hospital Liaison note.   Received request from [place] for family interest in [facility] with request for transfer today. Chart reviewed and eligibility confirmed.   Spoke to daughter Angela Nevin to confirm interest and explain services. Family agreeable to transfer today.  CSW aware.    Registration paperwork will be done by daughter via e-mail. I will let SW know once completed.  Dr. Orpah Melter to assume care per family request.    RN please call report to [phone].   Please arrange transport for patient once consents completed.   Thank you,   Clementeen Hoof, BSN, RN  [place] (listed on [facility] under [phone]   [phone]

## 2021-03-01 NOTE — Care Management Important Message (Signed)
Important Message  Patient Details  Name: Dorothy Barker MRN: [id number] Date of Birth: 10/19/1940   Medicare Important Message Given:  Yes     Orbie Pyo 03/01/2021, 2:56 PM

## 2021-03-01 NOTE — Plan of Care (Signed)
  Problem: Education: Goal: Knowledge of General Education information will improve Description: Including pain rating scale, medication(s)/side effects and non-pharmacologic comfort measures Outcome: Progressing   Problem: Health Behavior/Discharge Planning: Goal: Ability to manage health-related needs will improve Outcome: Progressing   Problem: Safety: Goal: Ability to remain free from injury will improve Outcome: Progressing   Problem: Ischemic Stroke/TIA Tissue Perfusion: Goal: Complications of ischemic stroke/TIA will be minimized Outcome: Progressing

## 2021-03-17 ENCOUNTER — Ambulatory Visit: Payer: Medicare PPO | Admitting: Podiatry

## 2021-03-21 ENCOUNTER — Ambulatory Visit: Payer: Medicare PPO | Admitting: Neurology

## 2021-03-21 ENCOUNTER — Telehealth: Payer: Self-pay | Admitting: Neurology

## 2021-03-21 DEATH — deceased

## 2021-04-05 ENCOUNTER — Ambulatory Visit: Payer: Medicare PPO | Admitting: Neurology

## 2021-04-12 ENCOUNTER — Ambulatory Visit: Payer: Medicare PPO | Admitting: Neurology

## 2021-04-19 ENCOUNTER — Institutional Professional Consult (permissible substitution): Payer: Medicare PPO | Admitting: Cardiology

## 2021-05-03 NOTE — Telephone Encounter (Signed)
Daughter called to inform pt is deceased

## 2023-03-15 IMAGING — CT CT HEAD W/O CM
3 series · 15 of 47 positions shown, 18 images · non-contrast
Comparison: MRI and CT studies 02/21/2021.

CLINICAL DATA: Follow-up left brain stroke.

EXAM:
CT HEAD WITHOUT CONTRAST
TECHNIQUE: Contiguous axial images were obtained from the base of the skull
through the vertex without intravenous contrast.

[Series 3: head 5.0 h30s · axial · 0.50mm/px · z∈[-227,-87]mm · 9 of 34 slices shown, 12 images]
[im 3/34  brain]
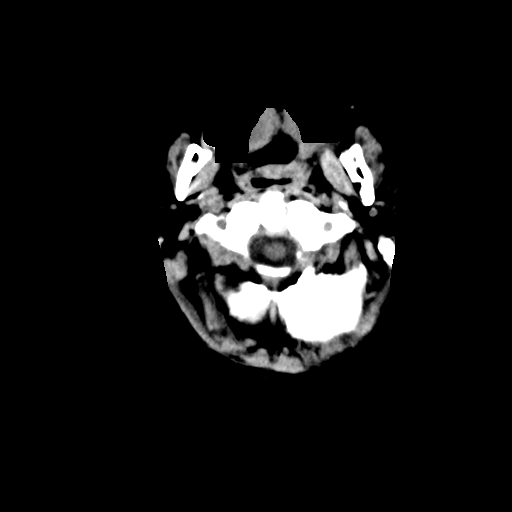
[im 3/34  bone]
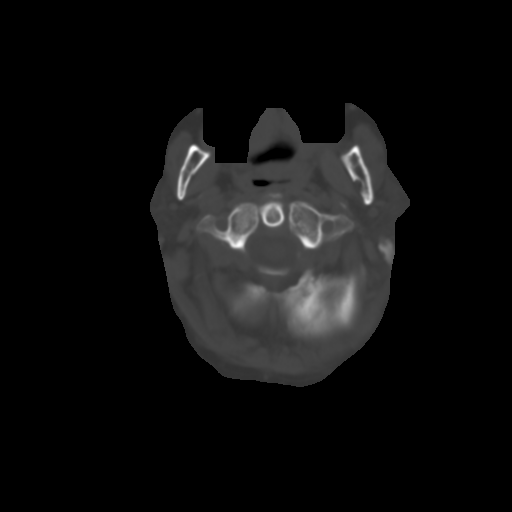
[im 6/34  brain]
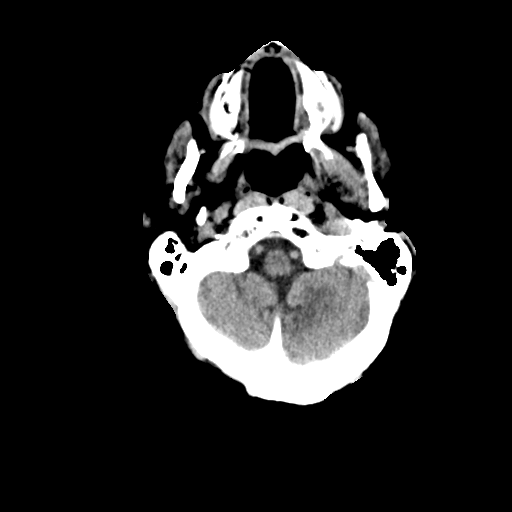
[im 10/34  brain]
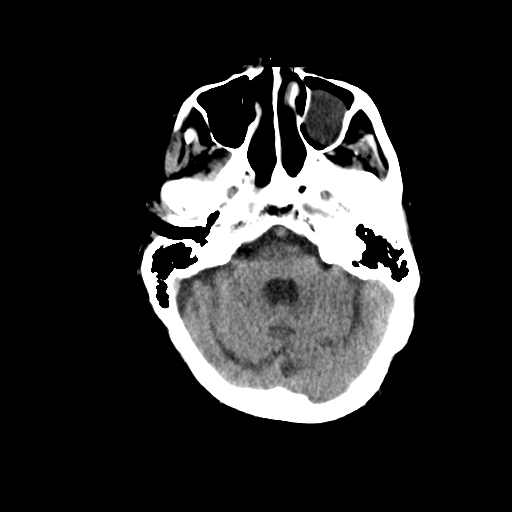
[im 13/34  brain]
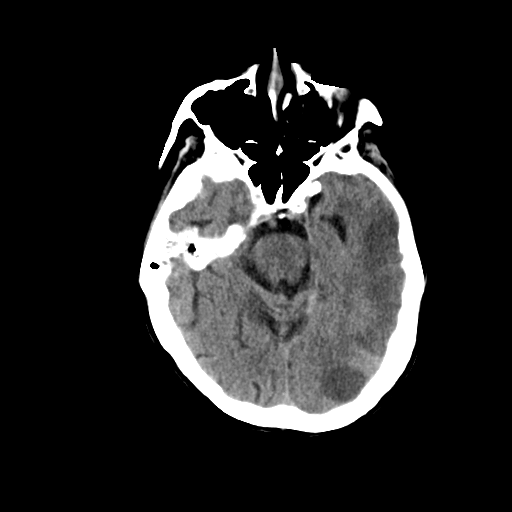
[im 18/34  brain]
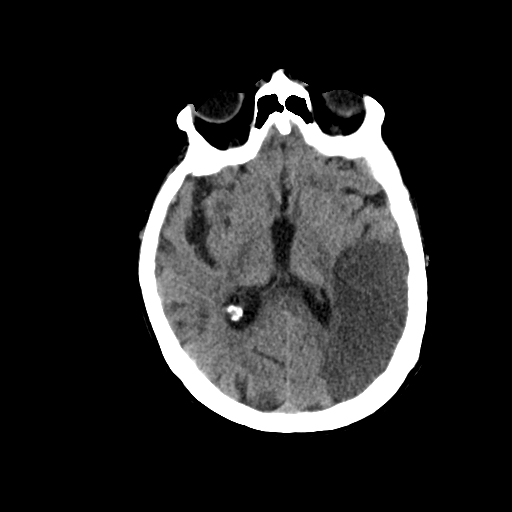
[im 18/34  bone]
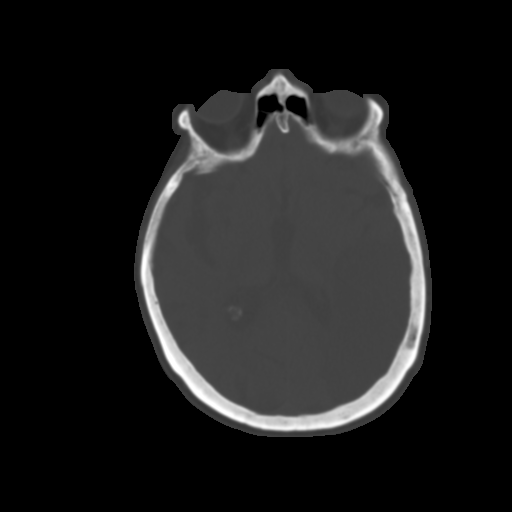
[im 21/34  brain]
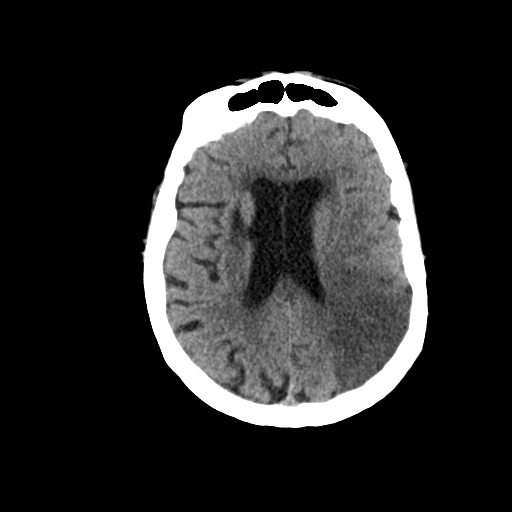
[im 24/34  brain]
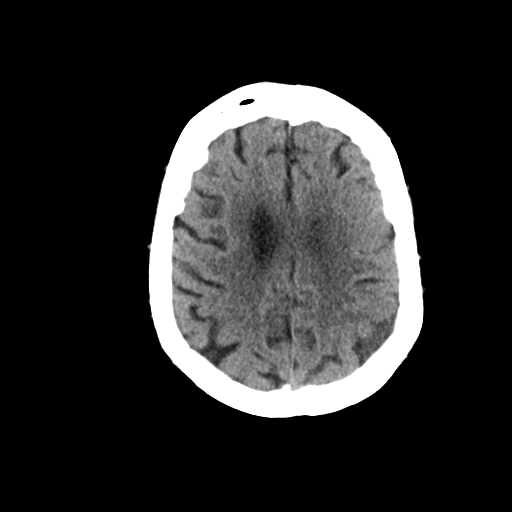
[im 28/34  brain]
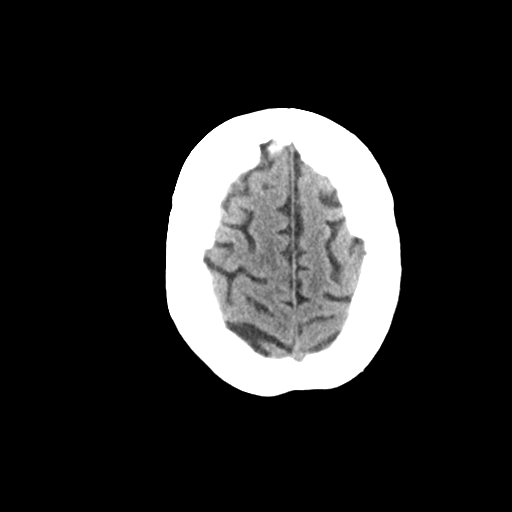
[im 31/34  brain]
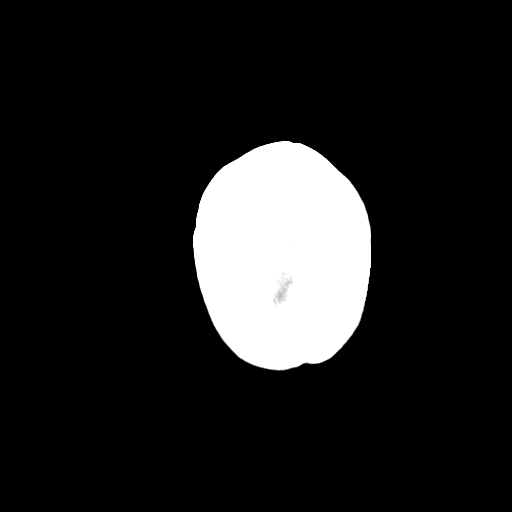
[im 31/34  bone]
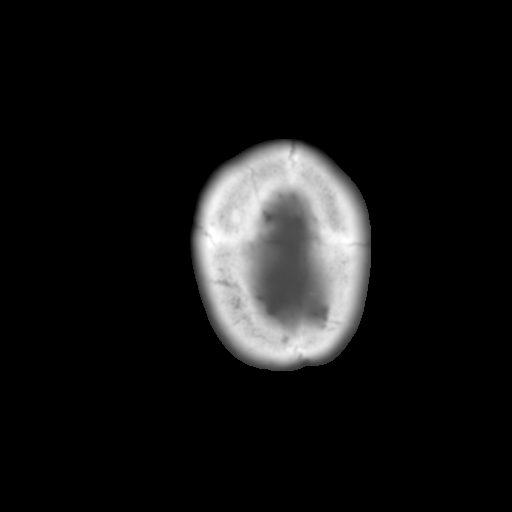

[Series 5: head 3.0 mpr cor · coronal · 0.36mm/px · 3 of 69 slices shown]
[im 23/69  brain]
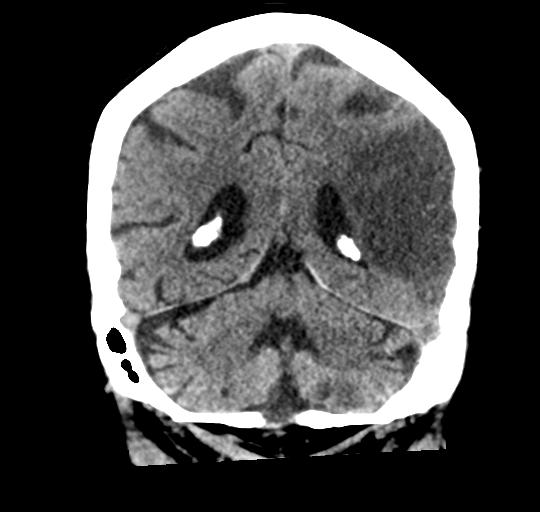
[im 31/69  brain]
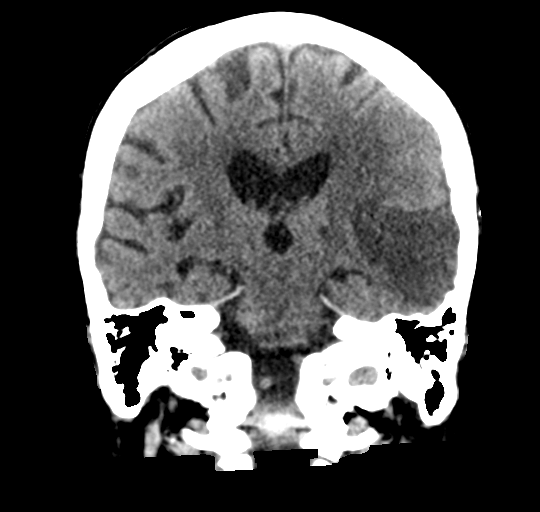
[im 38/69  brain]
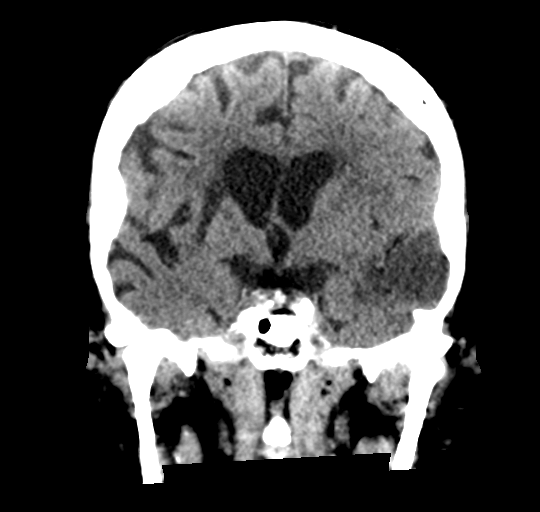

[Series 6: head 3.0 mpr sag · sagittal · 0.36mm/px · 3 of 67 slices shown]
[im 23/67  brain]
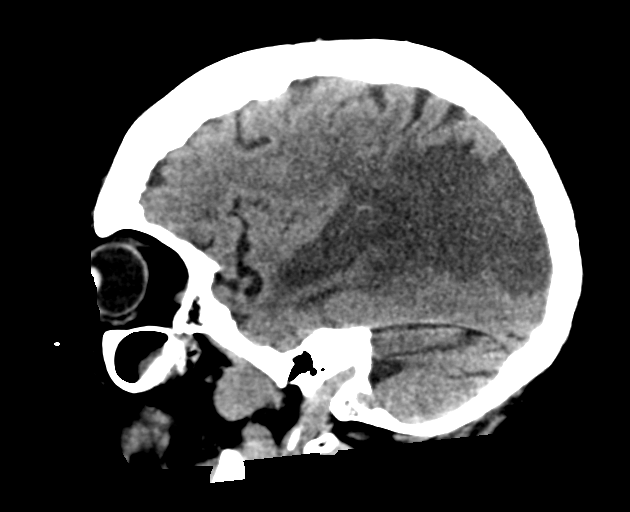
[im 34/67  brain]
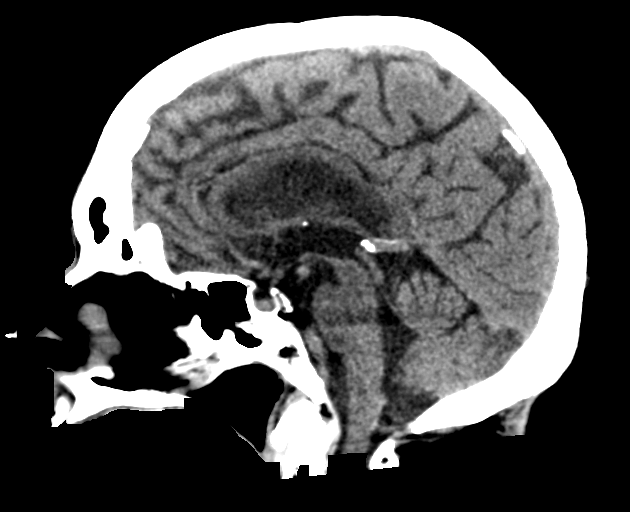
[im 45/67  brain]
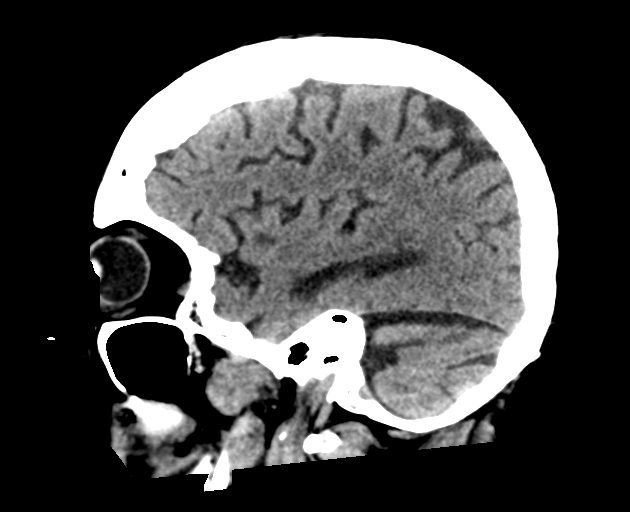

[15 of 47 positions shown; findings below may reference images not displayed]

FINDINGS: Brain: Brainstem and cerebellum remain unremarkable. Right cerebral
hemisphere shows old infarction in the basal ganglia and radiating
white matter tracts in small-vessel changes of the white matter. On
the left, there is been considerable progression of infarction
within the left MCA territory, apparently affecting the entire
inferior division which shows confluent low-density and swelling. No
evidence of hemorrhage there is only mild mass effect with
left-to-right shift of 1 mm. No hydrocephalus. No extra-axial
collection.

Vascular: There is atherosclerotic calcification of the major
vessels at the base of the brain.

Skull: Negative

Sinuses/Orbits: Clear except for a retention cyst in the left
maxillary sinus. Orbits negative.

Other: None
IMPRESSION: Extension of the previously seen left MCA territory infarction, now
involving the entire distribution of the inferior division of the
left MCA. Low-density throughout that region with mild swelling but
no hemorrhage.
